# Patient Record
Sex: Male | Born: 1935 | Race: White | Hispanic: No | Smoking: Current every day smoker
Health system: Southern US, Community
[De-identification: ages and names within clinical notes are randomized; demographics above are authoritative.]

## PROBLEM LIST (undated history)

## (undated) DIAGNOSIS — Z972 Presence of dental prosthetic device (complete) (partial): Secondary | ICD-10-CM

## (undated) DIAGNOSIS — I6522 Occlusion and stenosis of left carotid artery: Secondary | ICD-10-CM

## (undated) DIAGNOSIS — M5430 Sciatica, unspecified side: Secondary | ICD-10-CM

## (undated) DIAGNOSIS — T4145XA Adverse effect of unspecified anesthetic, initial encounter: Secondary | ICD-10-CM

## (undated) DIAGNOSIS — J449 Chronic obstructive pulmonary disease, unspecified: Secondary | ICD-10-CM

## (undated) DIAGNOSIS — T8859XA Other complications of anesthesia, initial encounter: Secondary | ICD-10-CM

## (undated) DIAGNOSIS — Z973 Presence of spectacles and contact lenses: Secondary | ICD-10-CM

## (undated) DIAGNOSIS — K449 Diaphragmatic hernia without obstruction or gangrene: Secondary | ICD-10-CM

## (undated) DIAGNOSIS — F329 Major depressive disorder, single episode, unspecified: Secondary | ICD-10-CM

## (undated) DIAGNOSIS — K219 Gastro-esophageal reflux disease without esophagitis: Secondary | ICD-10-CM

## (undated) DIAGNOSIS — F419 Anxiety disorder, unspecified: Secondary | ICD-10-CM

## (undated) DIAGNOSIS — J189 Pneumonia, unspecified organism: Secondary | ICD-10-CM

## (undated) DIAGNOSIS — I10 Essential (primary) hypertension: Secondary | ICD-10-CM

## (undated) DIAGNOSIS — G8929 Other chronic pain: Secondary | ICD-10-CM

## (undated) DIAGNOSIS — N400 Enlarged prostate without lower urinary tract symptoms: Secondary | ICD-10-CM

## (undated) DIAGNOSIS — M1990 Unspecified osteoarthritis, unspecified site: Secondary | ICD-10-CM

## (undated) DIAGNOSIS — F32A Depression, unspecified: Secondary | ICD-10-CM

## (undated) DIAGNOSIS — Z9289 Personal history of other medical treatment: Secondary | ICD-10-CM

## (undated) DIAGNOSIS — N429 Disorder of prostate, unspecified: Secondary | ICD-10-CM

## (undated) HISTORY — DX: Other chronic pain: G89.29

## (undated) HISTORY — DX: Depression, unspecified: F32.A

## (undated) HISTORY — DX: Benign prostatic hyperplasia without lower urinary tract symptoms: N40.0

## (undated) HISTORY — DX: Sciatica, unspecified side: M54.30

## (undated) HISTORY — DX: Essential (primary) hypertension: I10

## (undated) HISTORY — DX: Anxiety disorder, unspecified: F41.9

## (undated) HISTORY — PX: MULTIPLE TOOTH EXTRACTIONS: SHX2053

## (undated) HISTORY — DX: Disorder of prostate, unspecified: N42.9

## (undated) HISTORY — DX: Gastro-esophageal reflux disease without esophagitis: K21.9

## (undated) HISTORY — DX: Major depressive disorder, single episode, unspecified: F32.9

## (undated) HISTORY — DX: Diaphragmatic hernia without obstruction or gangrene: K44.9

---

## 2001-07-11 HISTORY — PX: ESOPHAGOGASTRODUODENOSCOPY: SHX1529

## 2001-10-02 ENCOUNTER — Ambulatory Visit (HOSPITAL_COMMUNITY): Admission: RE | Admit: 2001-10-02 | Discharge: 2001-10-02 | Payer: Self-pay | Admitting: Family Medicine

## 2001-10-02 ENCOUNTER — Encounter: Payer: Self-pay | Admitting: Family Medicine

## 2001-11-06 ENCOUNTER — Ambulatory Visit (HOSPITAL_COMMUNITY): Admission: RE | Admit: 2001-11-06 | Discharge: 2001-11-06 | Payer: Self-pay | Admitting: Internal Medicine

## 2004-12-02 ENCOUNTER — Ambulatory Visit: Payer: Self-pay | Admitting: Internal Medicine

## 2004-12-09 ENCOUNTER — Ambulatory Visit: Payer: Self-pay | Admitting: Internal Medicine

## 2004-12-09 ENCOUNTER — Ambulatory Visit (HOSPITAL_COMMUNITY): Admission: RE | Admit: 2004-12-09 | Discharge: 2004-12-09 | Payer: Self-pay | Admitting: Internal Medicine

## 2004-12-09 HISTORY — PX: COLONOSCOPY: SHX174

## 2006-10-26 ENCOUNTER — Ambulatory Visit: Payer: Self-pay | Admitting: Internal Medicine

## 2009-04-22 ENCOUNTER — Ambulatory Visit (HOSPITAL_COMMUNITY): Admission: RE | Admit: 2009-04-22 | Discharge: 2009-04-22 | Payer: Self-pay | Admitting: Family Medicine

## 2010-11-03 ENCOUNTER — Encounter: Payer: Self-pay | Admitting: Gastroenterology

## 2010-11-03 ENCOUNTER — Ambulatory Visit (INDEPENDENT_AMBULATORY_CARE_PROVIDER_SITE_OTHER): Payer: Medicare Other | Admitting: Gastroenterology

## 2010-11-03 VITALS — BP 149/68 | HR 68 | Temp 98.7°F | Ht 70.0 in | Wt 164.6 lb

## 2010-11-03 DIAGNOSIS — R1314 Dysphagia, pharyngoesophageal phase: Secondary | ICD-10-CM

## 2010-11-03 DIAGNOSIS — K219 Gastro-esophageal reflux disease without esophagitis: Secondary | ICD-10-CM

## 2010-11-03 DIAGNOSIS — Q393 Congenital stenosis and stricture of esophagus: Secondary | ICD-10-CM

## 2010-11-03 DIAGNOSIS — R1310 Dysphagia, unspecified: Secondary | ICD-10-CM

## 2010-11-03 DIAGNOSIS — K222 Esophageal obstruction: Secondary | ICD-10-CM

## 2010-11-03 NOTE — Assessment & Plan Note (Signed)
Typical heartburn symptoms well controlled on omeprazole.

## 2010-11-03 NOTE — Progress Notes (Signed)
Cc to PCP 

## 2010-11-03 NOTE — Progress Notes (Signed)
Primary Care Physician:  Kirk Ruths, MD  Primary Gastroenterologist:  Dr. Roetta Sessions  Chief Complaint  Patient presents with  . Dysphagia    feels like food is coming back up after swallowing    HPI:  Peter [name] is a 77 y.o. male here for further evaluation of dysphagia. He has a history of Schatzki ring status post dilation in 2003. He complains of chronic intermittent dysphagia to solid foods. Sometimes with liquids. Food comes back up. Cannot swallow large pills. Hates to eat out because he has to excuse himself to vomiting food back up. No heartburn. No abdominal pain. Took Aleve one day last week, burned esophagus. Tolerating ibuprofen. No odynophagia now. BM regular on lactulose. No melena, brbpr.    Current Outpatient Prescriptions  Medication Sig Dispense Refill  . ALPRAZolam (XANAX) 0.5 MG tablet Take 0.5 mg by mouth at bedtime as needed.        Marland Kitchen aspirin 325 MG tablet Take 325 mg by mouth as needed.        . finasteride (PROSCAR) 5 MG tablet Take 5 mg by mouth daily.        Marland Kitchen lactulose (CHRONULAC) 10 GM/15ML solution Take 20 g by mouth 3 (three) times daily.        Marland Kitchen lisinopril-hydrochlorothiazide (PRINZIDE,ZESTORETIC) 20-12.5 MG per tablet Take 1 tablet by mouth daily.        . naproxen sodium (ANAPROX) 220 MG tablet Take 220 mg by mouth as needed.        Marland Kitchen omeprazole (PRILOSEC) 20 MG capsule Take 20 mg by mouth daily.        . tadalafil (CIALIS) 10 MG tablet Take 10 mg by mouth daily as needed.        . Tamsulosin HCl (FLOMAX) 0.4 MG CAPS Take 0.4 mg by mouth daily after supper.          Allergies as of 11/03/2010  . (No Known Allergies)    Past Medical History  Diagnosis Date  . HTN (hypertension)   . Anxiety   . GERD (gastroesophageal reflux disease)   . Prostate disease     h/o elevated PSA, neg bx, [facility]    Past Surgical History  Procedure Date  . Esophagogastroduodenoscopy 2003    schatzki ring, small vascular abnormality of duodenal  bulb ?Dieulafoy's lesion (ablated)  . Colonoscopy 12/2004    diverticulosis, hyperplastic polyps, hemorrhoids    Family History  Problem Relation Age of Onset  . Stroke Father 33    deceased  . Colon cancer Neg Hx     History   Social History  . Marital Status: Married    Spouse Name: N/A    Number of Children: 1  . Years of Education: N/A   Occupational History  . 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 Smoking status: Former Smoker -- 1.0 packs/day for 50 years    Types: Cigarettes    Quit date: 10/16/2010  . Smokeless tobacco: Never Used  . Alcohol Use: No     quit 5 years ago  . Drug Use: No  . Sexually Active: Not Currently -- Male partner(s)    Birth Control/ Protection: None     spouse, has to use viagra     ROS:  General: Negative for anorexia, weight loss, fever, chills, fatigue, weakness. Eyes: Negative for vision changes.  ENT: Negative for hoarseness, difficulty swallowing , nasal congestion. CV: Negative for chest pain, angina,  palpitations, dyspnea on exertion, peripheral edema.  Respiratory: Negative for dyspnea at rest, dyspnea on exertion, cough, sputum, wheezing.  GI: See history of present illness. GU:  Negative for dysuria, hematuria, urinary incontinence, urinary frequency, nocturnal urination.  MS: Negative for joint pain, low back pain.  Derm: Negative for rash or itching. Skin lesions from sun exposure.  Neuro: Negative for weakness, abnormal sensation, seizure, frequent headaches, memory loss, confusion.  Psych: Negative for anxiety, depression, suicidal ideation, hallucinations.  Endo: Negative for unusual weight change.  Heme: Negative for bruising or bleeding. Allergy: Negative for rash or hives.    Physical Examination:  BP 149/68  Pulse 68  Temp 98.7 F (37.1 C)  Ht 5\' 10"  (1.778 m)  Wt 164 lb 9.6 oz (74.662 kg)  BMI 23.62 kg/m2   General: Well-nourished, well-developed in no acute distress.  Head:  Normocephalic, atraumatic.   Eyes: Conjunctiva pink, no icterus. Mouth: Oropharyngeal mucosa moist and pink , no lesions erythema or exudate. Neck: Supple without thyromegaly, masses, or lymphadenopathy.  Lungs: Clear to auscultation bilaterally.  Heart: Regular rate and rhythm, no murmurs rubs or gallops.  Abdomen: Bowel sounds are normal, nontender, nondistended, no hepatosplenomegaly or masses, no abdominal bruits or    hernia , no rebound or guarding.   Extremities: No lower extremity edema.  Neuro: Alert and oriented x 4 , grossly normal neurologically.  Skin: Warm and dry, no rash or jaundice.   Psych: Alert and cooperative, normal mood and affect.

## 2010-11-03 NOTE — Assessment & Plan Note (Signed)
Recurrent dysphagia mostly to solid foods. Likely has recurrent Schatzki ring or other stricture. EGD with dilation in the near future. I have discussed the risks, alternatives, benefits with regards to but not limited to the risk of reaction to medication, bleeding, infection, perforation and the patient is agreeable to proceed. Written consent to be obtained.

## 2010-11-10 ENCOUNTER — Encounter: Payer: Medicare Other | Admitting: Internal Medicine

## 2010-11-10 ENCOUNTER — Ambulatory Visit (HOSPITAL_COMMUNITY)
Admission: RE | Admit: 2010-11-10 | Discharge: 2010-11-10 | Disposition: A | Discharge status: Home / Self Care | Payer: Medicare Other | Source: Ambulatory Visit | Attending: Internal Medicine | Admitting: Internal Medicine

## 2010-11-10 DIAGNOSIS — K449 Diaphragmatic hernia without obstruction or gangrene: Secondary | ICD-10-CM | POA: Insufficient documentation

## 2010-11-10 DIAGNOSIS — R1310 Dysphagia, unspecified: Secondary | ICD-10-CM | POA: Insufficient documentation

## 2010-11-10 DIAGNOSIS — Z79899 Other long term (current) drug therapy: Secondary | ICD-10-CM | POA: Insufficient documentation

## 2010-11-10 DIAGNOSIS — Z7982 Long term (current) use of aspirin: Secondary | ICD-10-CM | POA: Insufficient documentation

## 2010-11-10 DIAGNOSIS — K222 Esophageal obstruction: Secondary | ICD-10-CM | POA: Insufficient documentation

## 2010-11-10 DIAGNOSIS — I10 Essential (primary) hypertension: Secondary | ICD-10-CM | POA: Insufficient documentation

## 2010-11-10 HISTORY — PX: ESOPHAGOGASTRODUODENOSCOPY: SHX1529

## 2010-11-11 NOTE — Op Note (Signed)
  NAME:  Peter [name], Peter [name]                ACCOUNT NO.:  [id number]  MEDICAL RECORD NO.:  [id number]           PATIENT TYPE:  O  LOCATION:  [facility]                          FACILITY:  [facility]  PHYSICIAN:  R. Roetta Sessions, M.D. DATE OF BIRTH:  1932/12/26  DATE OF PROCEDURE:  11/10/2010 DATE OF DISCHARGE:                              OPERATIVE REPORT   PROCEDURE:  EGD with Elease Hashimoto dilation.  INDICATIONS FOR PROCEDURE:  This is a very pleasant 77 year old gentleman with recurrent esophageal dysphagia, history of Schatzki's ring dilated back in 2003, reflux symptoms well-controlled on Prilosec 20 mg orally daily.  EGD is now being done potential for esophageal dilation and reviewed.  Risks, benefits, limitations, alternatives, have been discussed, questions answered.  Please see the documentation in the medical record.  PROCEDURE NOTE:  O2 saturation, blood pressure, pulse, respirations were monitored throughout the entire procedure.  CONSCIOUS SEDATION:  Versed 6 mg IV, fentanyl 125 mcg IV in divided doses.  Cetacaine spray for topical pharyngeal anesthesia.  INSTRUMENT:  Pentax video chip system.  FINDINGS:  Examination of the tubular esophagus revealed a two distal tandem fibers rings, the most distal one was quite tight would not initially admit the scope with gentle pressure I was able to pop through and this was associated with some dilation of the ring.  There was no esophagitis, no evidence Barrett's esophagus or tumor.  Stomach:  Gastric cavity was emptied and insufflated well with air. Thorough examination of gastric mucosa including retroflexion of proximal stomach, esophagogastric junction demonstrated a moderate-sized hiatal hernia only.  Pylorus was patent, easily traversed.  Examination of the bulb and second portion revealed no abnormalities.  THERAPEUTIC/DIAGNOSTIC MANEUVERS PERFORMED:  Scope was withdrawn.  A 52- French Maloney dilator was passed to full insertion with  mild-to- moderate resistance upon full insertion. A look back revealed both rings had been ruptured and there was a superficial 2-cm tear across the proximal ring which was again located distally just above the ring at the GE junction.  The patient tolerated the procedure well, was reactive to endoscopy.  IMPRESSION:  Two distal tandem esophageal ring status post dilation and disruption as described above.  Moderate size hiatal hernia, otherwise normal stomach, D1 and D2.  RECOMMENDATIONS: 1. Advance diet as tolerated, swallowing precautions emphasized.     Continue Prilosec 20 mg orally daily. 2. Follow up appointment with [place] in 6 months. 3. He was dilated to 56-French size back in 2003, things looked a     little tighter today.  He may need an additional dilation and a not     too distant future, get him up and back up to a reasonably normal     lumen, but today's maneuver should help him considerably.     Jonathon Bellows, M.D.     RMR/MEDQ  D:  11/10/2010  T:  11/10/2010  Job:  [id number]  cc:   Kirk Ruths, M.D. Fax: [phone]  Electronically Signed by Lorrin Goodell M.D. on 11/11/2010 03:48:56 PM

## 2010-11-26 NOTE — H&P (Signed)
Peter [name], Peter [name]                ACCOUNT NO.:  [id number]   MEDICAL RECORD NO.:  [id number]          PATIENT TYPE:  AMB   LOCATION:                                FACILITY:  [facility]   PHYSICIAN:  Lionel December, M.D.    DATE OF BIRTH:  Mar 10, 1933   DATE OF ADMISSION:  12/02/2004  DATE OF DISCHARGE:  LH                                HISTORY & PHYSICAL   CHIEF COMPLAINT:  Constipation.   HISTORY OF PRESENT ILLNESS:  The patient is a 77 year old Caucasian  gentleman with history of gastroesophageal reflux disease and dysphagia  secondary to Schatzki's ring requiring dilatation, who presents today for  further evaluation of new onset constipation.  For the last several weeks he  has been having difficulty with constipation.  He feels it may be due to  taking multiple supplements and vitamins recently.  He has since stopped  many of the new ones that he had started and this past week he has had  regular bowel movements again.  He denies any melena or rectal bleeding.  He  has never had a colonoscopy and would like to proceed with one at this time.  He denies any problems with dysphagia, nausea, vomiting, heartburn, melena,  abdominal pain.   CURRENT MEDICATIONS:  1.  Protonix 40 mg daily.  2.  Xanax 0.5 mg q.h.s. p.r.n.  3.  Saw Palmetto 320 mg b.i.d.  4.  Oral calcium 370 mg b.i.d.  5.  Aspirin 325 mg daily.  6.  Lisinopril 20/12.5 mg daily.   ALLERGIES:  PENICILLIN.   PAST MEDICAL HISTORY:  1.  Hypertension.  2.  Anxiety.  3.  Prostate disease, reports that his PSA is high; however, he has had a      negative biopsy before.  He is followed by Dr. Jerre Simon.  4.  Gastroesophageal reflux disease.  5.  History of dysphagia, status post dilatation of Schatzki's ring, last      procedure April 2003.  6.  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   PAST SURGICAL HISTORY:  None.   FAMILY HISTORY:  Father died of stroke at age  86.  Negative for colorectal  cancer or chronic GI illnesses.   SOCIAL HISTORY:  He is married and has one daughter.  He is retired from  [employer].  Positive for tobacco use.  He occasionally consumes alcohol.   REVIEW OF SYSTEMS:  See HPI for GI.  CONSTITUTIONAL:  Denies any weight  loss.  CARDIOPULMONARY:  Denies chest pain or shortness of breath,  palpitations, cough.   PHYSICAL EXAMINATION:  VITAL SIGNS:  Weight 179-1/2, height 5 feet 10  inches, temperature 98.4, blood pressure 132/74, pulse 88.  GENERAL:  Pleasant, well-nourished, well-developed, elderly, Caucasian male  in no acute distress.  SKIN:  Warm and dry, no jaundice.  HEENT:  Pupils equal, round, and reactive to light.  Conjunctivae are pink.  Sclerae nonicteric.  Oropharyngeal mucosa moist and pink.  No lesions,  erythema, or exudate.  NECK:  No lymphadenopathy or thyromegaly.  CHEST:  Lungs clear to auscultation.  CARDIAC:  Reveals regular rate and rhythm.  Normal S1/S2.  No murmurs, rubs,  or gallops.  ABDOMEN:  Positive bowel sounds, soft, nontender, nondistended.  No  organomegaly or masses.  EXTREMITIES:  No edema.   IMPRESSION:  Mr. Guertin is a 77 year old gentleman who recently had a  change in bowel movements.  He has had several weeks of constipation which  is unusual for him.  This may be due to vitamin supplements that he  initiated recently; however, he has never had a colonoscopy and therefore,  recommend one at this time.  I have discussed risks, alternatives, and  benefits with the patient with regards to, but not limited to, risk of  bleeding, infection, reaction to medication, or perforation of the bowel.  The patient is agreeable and wants to proceed.  He has a history of  gastroesophageal reflux disease and dysphagia with no symptoms at this time.  He is requesting trial of omeprazole due to financial reasons.   PLAN:  1.  Colonoscopy in the near future.  The patient requests an anxiolytic to       be given at time of colonoscopy, as he is prone to panic attacks, and      states that he had one immediately after his upper endoscopy in 2003.      Will discuss further with Dr. Karilyn Cota.  2.  Stop Protonix and try omeprazole 20 mg daily, #30, with five refills      given.  3.  Fiber choice two tablets daily, #20, samples given.      LL/MEDQ  D:  12/02/2004  T:  12/02/2004  Job:  [id number]   cc:   Kirk Ruths, M.D.  [address]  [place]  [place] [address]  Fax: [phone]

## 2010-11-26 NOTE — Group Therapy Note (Signed)
[facility]  Patient:    Peter [name], Peter [name] Visit Number: [id number] MRN: [id number]          Service Type: END Location: DAY Attending Physician:  Malissa Hippo Dictated by:   Lionel December, M.D. Admit Date:  11/06/2001   CC:         Karleen Hampshire, M.D.   Progress Note  COMMENTS:  The patient is a 77 year old Caucasian male who underwent EGD with esophageal dilatation and electrocoagulation of a vascular abnormality in in the sternum. He did well during and after the procedure. When he was brought from PACU to his room, he developed a drop in his blood pressure and heart rate. He also became diaphoretic but did not have any chest pain or dyspnea. He was felt to have vasovagal. He was seen by Dr. Jayme Cloud before I got there. He was given 1 mg of atropine IV and his heart rate quickly returned in 80s, and his blood pressure normalized. He still did not experience any chest pain or dyspnea. I talked with his wife and their daughter. They told me that he has had a few episodes like this before. I also talked with Dr. Regino Schultze over the phone, and he told me he has had vasovagal before. He was monitored in the hospital for about two hours, and he did not have any more problems. EKG was obtained which revealed normal sinus rhythm and ST abnormality in inferior leads felt to be a repolarization abnormality. The patient was discharged to home in stable condition.  I will talk with Dr. Regino Schultze and maybe he should consider having an exercise tolerance test to make sure he does not have some type of arrhythmia. Dictated by:   Lionel December, M.D. Attending Physician:  Malissa Hippo DD:  11/06/01 TD:  11/06/01 Job: [id number]

## 2010-11-26 NOTE — Op Note (Signed)
[name], [name]                ACCOUNT NO.:  [id number]   MEDICAL RECORD NO.:  [id number]          PATIENT TYPE:  AMB   LOCATION:  DAY                           FACILITY:  [facility]   PHYSICIAN:  Lionel December, M.D.    DATE OF BIRTH:  [birth year]   DATE OF PROCEDURE:  12/09/2004  DATE OF DISCHARGE:                                 OPERATIVE REPORT   PROCEDURE:  Colonoscopy.   INDICATIONS:  Peter [name] is a 77 year old Caucasian male with recent onset of  constipation, who is undergoing a diagnostic colonoscopy.  He denies rectal  bleeding or abdominal pain.  He has never undergone a colonoscopy or a  barium enema in the past.  The procedure risks were reviewed the patient,  informed consent was obtained.   PREMEDICATION:  Fentanyl 50 mcg IV, Versed 8 mg IV in divided dose.   FINDINGS:  Procedure performed in endoscopy suite.  The patient's vital  signs and O2 saturation were monitored during procedure and remained stable.  The patient was placed in left lateral recumbent position.  Rectal  examination performed.  No abnormality noted on external or digital exam.  The Olympus video scope was placed in the rectum and advanced under vision  into sigmoid colon, which was noncompliant and tortuous with scattered  diverticula.  Slowly and carefully the scope was advanced into splenic  flexure and further intubation of the cecum was easy.  The cecum and was  identified by ileocecal valve and appendiceal orifice.  Pictures were taken  for the record.  There was a single diverticulum at cecum as well.  As the  scope was withdrawn, colonic mucosa was examined for the second time.  There  were two small polyps at distal sigmoid colon which were ablated via cold  biopsy.  There were a couple more small polyps at rectum, which had  appearance of hyperplastic polyps and were left alone.  The scope was  retroflexed to examine anorectal junction, and small hemorrhoids were noted  below the dentate line.   Endoscope was straightened and withdrawn.  The  patient tolerated the procedure well.   FINAL DIAGNOSES:  1.  Colonic diverticulosis predominantly involving sigmoid and descending      colon with a single diverticulum also seen at cecum.  2.  Two small polyps removed via cold biopsy from distal sigmoid colon.  3.  External hemorrhoids.   RECOMMENDATIONS:  1.  He will continue high fiber diet, Benefiber and a Fiber Choice  daily.  2.  Lactulose one to two tablespoonfuls q.h.s.  Prescription given for one      quart refills for up to a year.  If this regimen does not help his      constipation, he will give [place] a call.       NR/MEDQ  D:  12/09/2004  T:  12/09/2004  Job:  [id number]   cc:   Kirk Ruths, M.D.  [address]  [place]  [place] [address]  Fax: [phone]

## 2011-05-16 ENCOUNTER — Ambulatory Visit (INDEPENDENT_AMBULATORY_CARE_PROVIDER_SITE_OTHER): Payer: Medicare Other | Admitting: Urgent Care

## 2011-05-16 ENCOUNTER — Encounter: Payer: Self-pay | Admitting: Urgent Care

## 2011-05-16 DIAGNOSIS — K219 Gastro-esophageal reflux disease without esophagitis: Secondary | ICD-10-CM

## 2011-05-16 DIAGNOSIS — K222 Esophageal obstruction: Secondary | ICD-10-CM

## 2011-05-16 DIAGNOSIS — R1314 Dysphagia, pharyngoesophageal phase: Secondary | ICD-10-CM

## 2011-05-16 DIAGNOSIS — R1310 Dysphagia, unspecified: Secondary | ICD-10-CM

## 2011-05-16 DIAGNOSIS — Q391 Atresia of esophagus with tracheo-esophageal fistula: Secondary | ICD-10-CM

## 2011-05-16 DIAGNOSIS — Q393 Congenital stenosis and stricture of esophagus: Secondary | ICD-10-CM

## 2011-05-16 DIAGNOSIS — K5900 Constipation, unspecified: Secondary | ICD-10-CM

## 2011-05-16 MED ORDER — OMEPRAZOLE 20 MG PO CPDR: 20.0000 mg | DELAYED_RELEASE_CAPSULE | Freq: Two times a day (BID) | ORAL | Status: DC

## 2011-05-16 MED ORDER — LACTULOSE 10 GM/15ML PO SOLN: 20.0000 g | Freq: Every evening | ORAL | Status: DC | PRN

## 2011-05-16 NOTE — Assessment & Plan Note (Signed)
Doing well on daily lactulose. Prescription refill. Colonoscopy 2016.

## 2011-05-16 NOTE — Assessment & Plan Note (Signed)
Improved.  Will call if he feels like he needs repeat dilation.

## 2011-05-16 NOTE — Progress Notes (Signed)
Cc to PCP 

## 2011-05-16 NOTE — Progress Notes (Signed)
Referring Provider: Kirk Ruths, MD Primary Care Physician:  Kirk Ruths, MD Primary Gastroenterologist:  Dr. Jena Gauss  Chief Complaint  Patient presents with  . Follow-up    doing ok    HPI:  Peter [name] is a 77 y.o. male here for follow up for dysphagia.  He tells me he has been doing very well since esophageal dilation. He did have 1 episode where he felt like his esophagus was closing.  Otherwise, he has been able to be in drink pretty much anything he wants.  Denies heartburn & indigestion.  Wanting a better PPI.  Denies abd pain.  Takes lactulose each night for constipation-wants refill.  She has 1-2 soft brown bowel movements daily. Denies any rectal bleeding or melena. His weight has been stable. Appetite is good. Past Medical History  Diagnosis Date  . HTN (hypertension)   . Anxiety   . GERD (gastroesophageal reflux disease)   . Prostate disease     h/o elevated PSA, neg bx, [facility]    Past Surgical History  Procedure Date  . Esophagogastroduodenoscopy 2003    schatzki ring, small vascular abnormality of duodenal bulb ?Dieulafoy's lesion (ablated)  . Colonoscopy 12/2004    diverticulosis, hyperplastic polyps, hemorrhoids  . Esophagogastroduodenoscopy 11/10/10    Dr. Jena Gauss dilation 2 rings with 83F    Current Outpatient Prescriptions  Medication Sig Dispense Refill  . ALPRAZolam (XANAX) 0.5 MG tablet Take 0.5 mg by mouth at bedtime as needed.        Marland Kitchen aspirin 325 MG tablet Take 325 mg by mouth as needed.        . finasteride (PROSCAR) 5 MG tablet Take 5 mg by mouth daily.        Marland Kitchen lactulose (CHRONULAC) 10 GM/15ML solution Take 30 mLs (20 g total) by mouth at bedtime as needed.  240 mL  5  . lisinopril-hydrochlorothiazide (PRINZIDE,ZESTORETIC) 20-12.5 MG per tablet Take 1 tablet by mouth daily.        . naproxen sodium (ANAPROX) 220 MG tablet Take 220 mg by mouth as needed.        Marland Kitchen omeprazole (PRILOSEC) 20 MG capsule Take 1 capsule (20 mg total) by  mouth 2 (two) times daily. Before breakfast & dinner  62 capsule  5  . tadalafil (CIALIS) 10 MG tablet Take 10 mg by mouth daily as needed.        . Tamsulosin HCl (FLOMAX) 0.4 MG CAPS Take 0.4 mg by mouth daily after supper.          Allergies as of 05/16/2011  . (No Known Allergies)    Review of Systems: Gen: Denies any fever, chills, sweats, anorexia, fatigue, weakness, malaise, weight loss, and sleep disorder CV: Denies chest pain, angina, palpitations, syncope, orthopnea, PND, peripheral edema, and claudication. Resp: Denies dyspnea at rest, dyspnea with exercise, cough, sputum, wheezing, coughing up blood, and pleurisy. GI: Denies vomiting blood, jaundice, and fecal incontinence.   Derm: Denies rash, itching, dry skin, hives, moles, warts, or unhealing ulcers.  Psych: Denies depression, anxiety, memory loss, suicidal ideation, hallucinations, paranoia, and confusion. Heme: Denies bruising, bleeding, and enlarged lymph nodes.  Physical Exam: BP 129/71  Pulse 86  Temp(Src) 98 F (36.7 C) (Temporal)  Ht 5\' 10"  (1.778 m)  Wt 172 lb 12.8 oz (78.382 kg)  BMI 24.79 kg/m2 General:   Alert,  Well-developed, well-nourished, pleasant and cooperative in NAD Head:  Normocephalic and atraumatic. Eyes:  Sclera clear, no icterus.   Conjunctiva pink.  Mouth:  No deformity or lesions, OP pink/moist. Neck:  Supple; no masses or thyromegaly. Heart:  Regular rate and rhythm; no murmurs, clicks, rubs,  or gallops. Abdomen:  Soft, nontender and nondistended. No masses, hepatosplenomegaly or hernias noted. Normal bowel sounds, without guarding, and without rebound.   Msk:  Symmetrical without gross deformities. Normal posture. Pulses:  Normal pulses noted. Extremities:  Without clubbing or edema. Neurologic:  Alert and  oriented x4;  grossly normal neurologically. Skin:  Intact without significant lesions or rashes. Cervical Nodes:  No significant cervical adenopathy. Psych:  Alert and  cooperative. Normal mood and affect.

## 2011-05-16 NOTE — Patient Instructions (Signed)
Increase omeprazole to before breakfast & dinner (twice per day) Continue lactulose at bedtime for constipation Call if any problems swallowing Colonoscopy in 2016

## 2011-05-16 NOTE — Assessment & Plan Note (Signed)
History of complicated GERD with Schatzki's /esophageal rings.  Last dilation 11/10/10. He has done very well. He would like to increase his omeprazole to twice a day dosing to see if this helps decrease the interval between dilations. I told him we would do a trial for 6 months and see if this helps. If not difference, he may resume once daily dosing.   omeprazole 20 mg twice a day, 5 refills

## 2011-08-17 ENCOUNTER — Emergency Department (HOSPITAL_COMMUNITY): Payer: Medicare Other

## 2011-08-17 ENCOUNTER — Encounter (HOSPITAL_COMMUNITY): Payer: Self-pay | Admitting: *Deleted

## 2011-08-17 ENCOUNTER — Emergency Department (HOSPITAL_COMMUNITY)
Admission: EM | Admit: 2011-08-17 | Discharge: 2011-08-17 | Disposition: A | Discharge status: Home / Self Care | Payer: Medicare Other | Attending: Emergency Medicine | Admitting: Emergency Medicine

## 2011-08-17 DIAGNOSIS — Z23 Encounter for immunization: Secondary | ICD-10-CM | POA: Insufficient documentation

## 2011-08-17 DIAGNOSIS — W1809XA Striking against other object with subsequent fall, initial encounter: Secondary | ICD-10-CM | POA: Insufficient documentation

## 2011-08-17 DIAGNOSIS — N429 Disorder of prostate, unspecified: Secondary | ICD-10-CM | POA: Insufficient documentation

## 2011-08-17 DIAGNOSIS — R51 Headache: Secondary | ICD-10-CM | POA: Insufficient documentation

## 2011-08-17 DIAGNOSIS — S0180XA Unspecified open wound of other part of head, initial encounter: Secondary | ICD-10-CM | POA: Insufficient documentation

## 2011-08-17 DIAGNOSIS — I10 Essential (primary) hypertension: Secondary | ICD-10-CM | POA: Insufficient documentation

## 2011-08-17 DIAGNOSIS — K219 Gastro-esophageal reflux disease without esophagitis: Secondary | ICD-10-CM | POA: Insufficient documentation

## 2011-08-17 DIAGNOSIS — M47812 Spondylosis without myelopathy or radiculopathy, cervical region: Secondary | ICD-10-CM | POA: Insufficient documentation

## 2011-08-17 DIAGNOSIS — Y9289 Other specified places as the place of occurrence of the external cause: Secondary | ICD-10-CM | POA: Insufficient documentation

## 2011-08-17 DIAGNOSIS — S0083XA Contusion of other part of head, initial encounter: Secondary | ICD-10-CM

## 2011-08-17 DIAGNOSIS — I498 Other specified cardiac arrhythmias: Secondary | ICD-10-CM | POA: Insufficient documentation

## 2011-08-17 DIAGNOSIS — F411 Generalized anxiety disorder: Secondary | ICD-10-CM | POA: Insufficient documentation

## 2011-08-17 DIAGNOSIS — S0181XA Laceration without foreign body of other part of head, initial encounter: Secondary | ICD-10-CM

## 2011-08-17 DIAGNOSIS — Z7982 Long term (current) use of aspirin: Secondary | ICD-10-CM | POA: Insufficient documentation

## 2011-08-17 DIAGNOSIS — F1021 Alcohol dependence, in remission: Secondary | ICD-10-CM | POA: Insufficient documentation

## 2011-08-17 DIAGNOSIS — Z87891 Personal history of nicotine dependence: Secondary | ICD-10-CM | POA: Insufficient documentation

## 2011-08-17 MED ORDER — LORAZEPAM 1 MG PO TABS
ORAL_TABLET | ORAL | Status: AC
  Administered 2011-08-17: 1 mg
  Filled 2011-08-17: qty 1

## 2011-08-17 MED ORDER — LIDOCAINE HCL (PF) 1 % IJ SOLN
5.0000 mL | Freq: Once | INTRAMUSCULAR | Status: AC
  Administered 2011-08-17: 5 mL
  Filled 2011-08-17: qty 5

## 2011-08-17 MED ORDER — TETANUS-DIPHTHERIA TOXOIDS TD 5-2 LFU IM INJ
0.5000 mL | INJECTION | Freq: Once | INTRAMUSCULAR | Status: AC
  Administered 2011-08-17: 0.5 mL via INTRAMUSCULAR
  Filled 2011-08-17: qty 0.5

## 2011-08-17 MED ORDER — TETANUS-DIPHTH-ACELL PERTUSSIS 5-2.5-18.5 LF-MCG/0.5 IM SUSP: 0.5000 mL | Freq: Once | INTRAMUSCULAR | Status: DC

## 2011-08-17 NOTE — ED Notes (Addendum)
Pt on backboard. 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 All VS WNL. Pt talking to staff.

## 2011-08-17 NOTE — ED Notes (Signed)
PA at bedside.

## 2011-08-17 NOTE — ED Notes (Signed)
Pt to nursing station wanting to be discharged, Jacobus PA aware

## 2011-08-17 NOTE — ED Notes (Signed)
Discharge instructions reviewed with pt, questions answered. Pt verbalized understanding.  

## 2011-08-17 NOTE — ED Provider Notes (Signed)
History     CSN: [id number]  Arrival date & time 08/17/11  0813   First MD Initiated Contact with Patient 08/17/11 [phone]      Chief Complaint  Patient presents with  . Fall  . Head Laceration    (Consider location/radiation/quality/duration/timing/severity/associated sxs/prior treatment) HPI Comments: Patient states he was locking a bathroom door taking a step backward when he stumbled over a curb. He then fell into some trim bushes, his head and sustained a laceration to the right for head. He denies loss of consciousness. He denies difficulty with movement of any extremities. His family denies any change in his speech or thought patterns. He is not had any nausea or vomiting since the fall. He does complain of mild headache. No visual changes. He is unsure of his last tetanus shot. He presents now for evaluation of these injuries.  The patient was brought to the emergency department on long spine board with neck collar O. Mobilization as well. There were no neurologic changes during transport according to EMS.  Patient is a 78 y.o. male presenting with fall and scalp laceration. The history is provided by the patient and the spouse.  Fall Pertinent negatives include no abdominal pain and no hematuria.  Head Laceration Pertinent negatives include no abdominal pain, arthralgias, chest pain, coughing or neck pain.    Past Medical History  Diagnosis Date  . HTN (hypertension)   . Anxiety   . GERD (gastroesophageal reflux disease)   . Prostate disease     h/o elevated PSA, neg bx, [facility]    Past Surgical History  Procedure Date  . Esophagogastroduodenoscopy 2003    schatzki ring, small vascular abnormality of duodenal bulb ?Dieulafoy's lesion (ablated)  . Colonoscopy 12/2004    diverticulosis, hyperplastic polyps, hemorrhoids  . Esophagogastroduodenoscopy 11/10/10    Dr. Jena Gauss dilation 2 rings with 63F    Family History  Problem Relation Age of Onset  . Stroke  Father 35    deceased  . Colon cancer Neg Hx     History  Substance Use Topics  . Smoking status: Former Smoker -- 1.0 packs/day for 50 years    Types: Cigarettes    Quit date: 10/16/2010  . Smokeless tobacco: Never Used  . Alcohol Use: No     quit 5 years ago      Review of Systems  Constitutional: Negative for activity change.       All ROS Neg except as noted in HPI  HENT: Negative for nosebleeds and neck pain.   Eyes: Negative for photophobia and discharge.  Respiratory: Negative for cough, shortness of breath and wheezing.   Cardiovascular: Negative for chest pain and palpitations.  Gastrointestinal: Negative for abdominal pain and blood in stool.       Indigestion  Genitourinary: Negative for dysuria, frequency and hematuria.  Musculoskeletal: Negative for back pain and arthralgias.  Skin: Negative.   Neurological: Negative for dizziness, seizures and speech difficulty.  Psychiatric/Behavioral: Negative for hallucinations and confusion. The patient is nervous/anxious.     Allergies  Review of patient's allergies indicates no known allergies.  Home Medications   Current Outpatient Rx  Name Route Sig Dispense Refill  . ALPRAZOLAM 0.5 MG PO TABS Oral Take 0.5 mg by mouth at bedtime as needed.      . ASPIRIN 325 MG PO TABS Oral Take 325 mg by mouth as needed.      Marland Kitchen FINASTERIDE 5 MG PO TABS Oral Take 5 mg by mouth daily.      Marland Kitchen  LACTULOSE 10 GM/15ML PO SOLN Oral Take 30 mLs (20 g total) by mouth at bedtime as needed. 240 mL 5  . LISINOPRIL-HYDROCHLOROTHIAZIDE 20-12.5 MG PO TABS Oral Take 1 tablet by mouth daily.      Marland Kitchen NAPROXEN SODIUM 220 MG PO TABS Oral Take 220 mg by mouth as needed.      Marland Kitchen OMEPRAZOLE 20 MG PO CPDR Oral Take 1 capsule (20 mg total) by mouth 2 (two) times daily. Before breakfast & dinner 62 capsule 5  . TADALAFIL 10 MG PO TABS Oral Take 10 mg by mouth daily as needed.      Marland Kitchen TAMSULOSIN HCL 0.4 MG PO CAPS Oral Take 0.4 mg by mouth daily after supper.         BP 155/60  Pulse 60  Temp(Src) 96.5 F (35.8 C) (Oral)  Resp 17  Ht 5\' 11"  (1.803 m)  Wt 170 lb (77.111 kg)  BMI 23.71 kg/m2  SpO2 96%  Physical Exam  Nursing note and vitals reviewed. Constitutional: He is oriented to person, place, and time. He appears well-developed and well-nourished.  Non-toxic appearance.  HENT:  Right Ear: Tympanic membrane and external ear normal.  Left Ear: Tympanic membrane and external ear normal.  Nose: Nose normal.       There is a 4 cm laceration to the right for head. Bleeding controlled.there is no periorbital tenderness appreciated. No pain or deformity of the TMJ.  Eyes: EOM and lids are normal. Pupils are equal, round, and reactive to light.  Neck: Carotid bruit is not present.       C-spine immobilized in a collar. No palpable step off or deformity of the cervical spine.  Cardiovascular: Normal rate, regular rhythm, intact distal pulses and normal pulses.   Murmur heard. Pulmonary/Chest: Breath sounds normal. No respiratory distress.       No rib or chest wall tenderness appreciated. No palpable deformity. No bruise appreciated.  Abdominal: Soft. Bowel sounds are normal. There is no tenderness. There is no guarding.  Musculoskeletal: Normal range of motion.       Good range of motion of upper and lower extremities. No pain to movement or manipulation of the pelvis.  Lymphadenopathy:       Head (right side): No submandibular adenopathy present.       Head (left side): No submandibular adenopathy present.    He has no cervical adenopathy.  Neurological: He is alert and oriented to person, place, and time. He has normal strength. No cranial nerve deficit or sensory deficit. He exhibits normal muscle tone. Coordination normal.  Skin: Skin is warm and dry.  Psychiatric: His speech is normal. His mood appears anxious.    ED Course  LACERATION REPAIR Date/Time: 08/17/2011 9:40 AM Performed by: Kathie Dike Authorized by: Kathie Dike Consent: Verbal consent obtained. Risks and benefits: risks, benefits and alternatives were discussed Consent given by: patient Patient understanding: patient states understanding of the procedure being performed Patient identity confirmed: arm band Time out: Immediately prior to procedure a "time out" was called to verify the correct patient, procedure, equipment, support staff and site/side marked as required. Body area: head/neck Location details: forehead Laceration length: 3.8 cm Foreign body present: bark. Nerve involvement: none Vascular damage: no Anesthesia: local infiltration Local anesthetic: lidocaine 1% without epinephrine Patient sedated: no Preparation: Patient was prepped and draped in the usual sterile fashion. Irrigation solution: saline Amount of cleaning: standard Debridement: none Degree of undermining: none Skin closure: 5-0 nylon Number  of sutures: 6 Technique: simple Approximation: close Approximation difficulty: simple Dressing: 4x4 sterile gauze Comments: FB removed by me.   (including critical care time)  Labs Reviewed - No data to display No results found. EKG: Time 7:51. Rate: 55. Rhythm: Sinus bradycardia. PR: Normal. QRS: Normal. ST: Normal.No STEMI, or life threatening arrhythmia. Except for slower rate, unchanged from 11/09/2006.  No diagnosis found.    MDM  I have reviewed nursing notes, vital signs, and all appropriate lab and imaging results for this patient. The results of the CT scans were discussed with the patient and the family. The need to have the sutures removed in 5-7 days was discussed. Patient has not had any changes to his exam while being in the emergency department. He is conversing with his family without problem and ambulating in the room and hall without problem.       Kathie Dike, [place] 08/17/11 [phone]

## 2011-08-17 NOTE — ED Notes (Signed)
Family at bedside. Patient wants to know if his X-ray is back he states that he wants his C-collar off that it is hurting him. RN Tammy Sours aware of situation. Patient was told as soon as x-rays are back we would try and remove the c-collar

## 2011-08-17 NOTE — ED Notes (Signed)
C-Collar removed per PA [facility]

## 2011-08-17 NOTE — ED Notes (Signed)
PA Hobson at bedside to suture LAC

## 2011-08-17 NOTE — ED Notes (Signed)
Backboard removed per MD, neck collar remains in place.  Pt's wife stated "when he has a panic attack he passes out". Pt also believes he had a panic attack before he fell.

## 2011-08-18 NOTE — ED Provider Notes (Signed)
Medical screening examination/treatment/procedure(s) were performed by non-physician practitioner and as supervising physician I was immediately available for consultation/collaboration.   Laray Anger, DO 08/18/11 1215

## 2011-11-11 ENCOUNTER — Encounter: Payer: Self-pay | Admitting: Internal Medicine

## 2012-01-05 ENCOUNTER — Other Ambulatory Visit: Payer: Self-pay

## 2012-01-05 MED ORDER — LACTULOSE 10 GM/15ML PO SOLN: 20.0000 g | Freq: Every evening | ORAL | Status: DC | PRN

## 2012-01-06 ENCOUNTER — Telehealth: Payer: Self-pay

## 2012-01-06 NOTE — Telephone Encounter (Signed)
Pt said his refill on the lactulose will not last him a month. I called pharmacist, Sherrine Maples, at [facility]. He said if he can just a verbal OK. He can give the 473 ml ( which is two bottles at the time and that will last pt about a month) Please advise!

## 2012-01-06 NOTE — Telephone Encounter (Signed)
OK 

## 2012-01-06 NOTE — Telephone Encounter (Signed)
Called and informed pharmacist, Sherrine Maples.

## 2012-03-20 ENCOUNTER — Telehealth: Payer: Self-pay | Admitting: Internal Medicine

## 2012-03-20 MED ORDER — OMEPRAZOLE 20 MG PO CPDR: 20.0000 mg | DELAYED_RELEASE_CAPSULE | Freq: Two times a day (BID) | ORAL | Status: DC

## 2012-03-20 NOTE — Telephone Encounter (Signed)
Done

## 2012-03-20 NOTE — Telephone Encounter (Signed)
Pt came to front window requesting that we call in a refill prescription to [facility] for his Omeprazole. He has 2 pills left and wants to pick it up in 30-40 minutes.

## 2012-05-14 ENCOUNTER — Encounter (HOSPITAL_COMMUNITY): Payer: Self-pay | Admitting: Pharmacy Technician

## 2012-05-16 NOTE — Patient Instructions (Signed)
Your procedure is scheduled on:  05/21/12  Report to [facility] at 06:30 AM.  Call this number if you have problems the morning of surgery: [phone]   Remember:   Do not eat or drink:After Midnight.  Take these medicines the morning of surgery with A SIP OF WATER: Omeprazole, Lisinopril-HCTZ, and Flomax.   Do not wear jewelry, make-up or nail polish.  Do not wear lotions, powders, or perfumes. You may wear deodorant.  Do not shave 48 hours prior to surgery. Men may shave face and neck.  Do not bring valuables to the hospital.  Contacts, dentures or bridgework may not be worn into surgery.  Leave suitcase in the car. After surgery it may be brought to your room.  For patients admitted to the hospital, checkout time is 11:00 AM the day of discharge.   Patients discharged the day of surgery will not be allowed to drive home.    Special Instructions: Start using your eye drops before surgery as directed by your eye doctor.   Please read over the following fact sheets that you were given: Anesthesia Post-op Instructions    Cataract Surgery  A cataract is a clouding of the lens of the eye. When a lens becomes cloudy, vision is reduced based on the degree and nature of the clouding. Surgery may be needed to improve vision. Surgery removes the cloudy lens and usually replaces it with a substitute lens (intraocular lens, IOL). LET YOUR EYE DOCTOR KNOW ABOUT:  Allergies to food or medicine.  Medicines taken including herbs, eyedrops, over-the-counter medicines, and creams.  Use of steroids (by mouth or creams).  Previous problems with anesthetics or numbing medicine.  History of bleeding problems or blood clots.  Previous surgery.  Other health problems, including diabetes and kidney problems.  Possibility of pregnancy, if this applies. RISKS AND COMPLICATIONS  Infection.  Inflammation of the eyeball (endophthalmitis) that can spread to both eyes (sympathetic  ophthalmia).  Poor wound healing.  If an IOL is inserted, it can later fall out of proper position. This is very uncommon.  Clouding of the part of your eye that holds an IOL in place. This is called an "after-cataract." These are uncommon, but easily treated. BEFORE THE PROCEDURE  Do not eat or drink anything except small amounts of water for 8 to 12 before your surgery, or as directed by your caregiver.  Unless you are told otherwise, continue any eyedrops you have been prescribed.  Talk to your primary caregiver about all other medicines that you take (both prescription and non-prescription). In some cases, you may need to stop or change medicines near the time of your surgery. This is most important if you are taking blood-thinning medicine.Do not stop medicines unless you are told to do so.  Arrange for someone to drive you to and from the procedure.  Do not put contact lenses in either eye on the day of your surgery. PROCEDURE There is more than one method for safely removing a cataract. Your doctor can explain the differences and help determine which is best for you. Phacoemulsification surgery is the most common form of cataract surgery.  An injection is given behind the eye or eyedrops are given to make this a painless procedure.  A small cut (incision) is made on the edge of the clear, dome-shaped surface that covers the front of the eye (cornea).  A tiny probe is painlessly inserted into the eye. This device gives off ultrasound waves that soften and break  up the cloudy center of the lens. This makes it easier for the cloudy lens to be removed by suction.  An IOL may be implanted.  The normal lens of the eye is covered by a clear capsule. Part of that capsule is intentionally left in the eye to support the IOL.  Your surgeon may or may not use stitches to close the incision. There are other forms of cataract surgery that require a larger incision and stiches to close the  eye. This approach is taken in cases where the doctor feels that the cataract cannot be easily removed using phacoemulsification. AFTER THE PROCEDURE  When an IOL is implanted, it does not need care. It becomes a permanent part of your eye and cannot be seen or felt.  Your doctor will schedule follow-up exams to check on your progress.  Review your other medicines with your doctor to see which can be resumed after surgery.  Use eyedrops or take medicine as prescribed by your doctor. Document Released: 06/16/2011 Document Revised: 09/19/2011 Document Reviewed: 06/16/2011 [facility] Patient Information 2013 [place], [place].    PATIENT INSTRUCTIONS POST-ANESTHESIA  IMMEDIATELY FOLLOWING SURGERY:  Do not drive or operate machinery for the first twenty four hours after surgery.  Do not make any important decisions for twenty four hours after surgery or while taking narcotic pain medications or sedatives.  If you develop intractable nausea and vomiting or a severe headache please notify your doctor immediately.  FOLLOW-UP:  Please make an appointment with your surgeon as instructed. You do not need to follow up with anesthesia unless specifically instructed to do so.  WOUND CARE INSTRUCTIONS (if applicable):  Keep a dry clean dressing on the anesthesia/puncture wound site if there is drainage.  Once the wound has quit draining you may leave it open to air.  Generally you should leave the bandage intact for twenty four hours unless there is drainage.  If the epidural site drains for more than 36-48 hours please call the anesthesia department.  QUESTIONS?:  Please feel free to call your physician or the hospital operator if you have any questions, and they will be happy to assist you.

## 2012-05-17 ENCOUNTER — Encounter (HOSPITAL_COMMUNITY): Payer: Self-pay

## 2012-05-17 ENCOUNTER — Encounter (HOSPITAL_COMMUNITY)
Admission: RE | Admit: 2012-05-17 | Discharge: 2012-05-17 | Disposition: A | Discharge status: Home / Self Care | Payer: Medicare Other | Source: Ambulatory Visit | Attending: Ophthalmology | Admitting: Ophthalmology

## 2012-05-17 ENCOUNTER — Other Ambulatory Visit (HOSPITAL_COMMUNITY): Payer: Medicare Other

## 2012-05-17 HISTORY — DX: Unspecified osteoarthritis, unspecified site: M19.90

## 2012-05-17 LAB — BASIC METABOLIC PANEL
Chloride: 89 mEq/L — ABNORMAL LOW (ref 96–112)
GFR calc Af Amer: >90 mL/min (ref >90)
Potassium: 3.8 mEq/L (ref 3.5–5.1)
Sodium: 128 mEq/L — ABNORMAL LOW (ref 135–145)

## 2012-05-17 LAB — HEMOGLOBIN AND HEMATOCRIT, BLOOD: Hemoglobin: 15.1 g/dL (ref 13.0–17.0)

## 2012-05-21 ENCOUNTER — Ambulatory Visit (HOSPITAL_COMMUNITY): Payer: Medicare Other | Admitting: Anesthesiology

## 2012-05-21 ENCOUNTER — Encounter (HOSPITAL_COMMUNITY): Payer: Self-pay | Admitting: Anesthesiology

## 2012-05-21 ENCOUNTER — Encounter (HOSPITAL_COMMUNITY)
Admission: RE | Disposition: A | Discharge status: Home / Self Care | Payer: Self-pay | Source: Ambulatory Visit | Attending: Ophthalmology

## 2012-05-21 ENCOUNTER — Encounter (HOSPITAL_COMMUNITY): Payer: Self-pay | Admitting: Ophthalmology

## 2012-05-21 ENCOUNTER — Ambulatory Visit (HOSPITAL_COMMUNITY)
Admission: RE | Admit: 2012-05-21 | Discharge: 2012-05-21 | Disposition: A | Discharge status: Home / Self Care | Payer: Medicare Other | Source: Ambulatory Visit | Attending: Ophthalmology | Admitting: Ophthalmology

## 2012-05-21 ENCOUNTER — Encounter (HOSPITAL_COMMUNITY): Payer: Self-pay | Admitting: *Deleted

## 2012-05-21 DIAGNOSIS — I10 Essential (primary) hypertension: Secondary | ICD-10-CM | POA: Insufficient documentation

## 2012-05-21 DIAGNOSIS — Z01812 Encounter for preprocedural laboratory examination: Secondary | ICD-10-CM | POA: Insufficient documentation

## 2012-05-21 DIAGNOSIS — J449 Chronic obstructive pulmonary disease, unspecified: Secondary | ICD-10-CM | POA: Insufficient documentation

## 2012-05-21 DIAGNOSIS — H2510 Age-related nuclear cataract, unspecified eye: Secondary | ICD-10-CM | POA: Insufficient documentation

## 2012-05-21 DIAGNOSIS — Z01810 Encounter for preprocedural cardiovascular examination: Secondary | ICD-10-CM | POA: Insufficient documentation

## 2012-05-21 DIAGNOSIS — J4489 Other specified chronic obstructive pulmonary disease: Secondary | ICD-10-CM | POA: Insufficient documentation

## 2012-05-21 HISTORY — PX: CATARACT EXTRACTION W/PHACO: SHX586

## 2012-05-21 SURGERY — PHACOEMULSIFICATION, CATARACT, WITH IOL INSERTION
Anesthesia: Monitor Anesthesia Care | Site: Eye | Laterality: Right | Wound class: Clean

## 2012-05-21 MED ORDER — NEOMYCIN-POLYMYXIN-DEXAMETH 0.1 % OP OINT
TOPICAL_OINTMENT | OPHTHALMIC | Status: DC | PRN
  Administered 2012-05-21: 1 via OPHTHALMIC

## 2012-05-21 MED ORDER — PHENYLEPHRINE HCL 2.5 % OP SOLN
OPHTHALMIC | Status: AC
  Filled 2012-05-21: qty 2

## 2012-05-21 MED ORDER — FENTANYL CITRATE 0.05 MG/ML IJ SOLN: 25.0000 ug | INTRAMUSCULAR | Status: DC | PRN

## 2012-05-21 MED ORDER — LACTATED RINGERS IV SOLN
INTRAVENOUS | Status: DC
  Administered 2012-05-21: 07:00:00 via INTRAVENOUS

## 2012-05-21 MED ORDER — MIDAZOLAM HCL 2 MG/2ML IJ SOLN
INTRAMUSCULAR | Status: AC
  Filled 2012-05-21: qty 2

## 2012-05-21 MED ORDER — LIDOCAINE HCL 3.5 % OP GEL
OPHTHALMIC | Status: AC
  Filled 2012-05-21: qty 5

## 2012-05-21 MED ORDER — MIDAZOLAM HCL 2 MG/2ML IJ SOLN
1.0000 mg | INTRAMUSCULAR | Status: DC | PRN
  Administered 2012-05-21 (×2): 1 mg via INTRAVENOUS

## 2012-05-21 MED ORDER — LIDOCAINE HCL 3.5 % OP GEL: 1.0000 "application " | Freq: Once | OPHTHALMIC | Status: DC

## 2012-05-21 MED ORDER — EPINEPHRINE HCL 1 MG/ML IJ SOLN
INTRAOCULAR | Status: DC | PRN
  Administered 2012-05-21: 08:00:00

## 2012-05-21 MED ORDER — PROVISC 10 MG/ML IO SOLN
INTRAOCULAR | Status: DC | PRN
  Administered 2012-05-21: 8.5 mg via INTRAOCULAR

## 2012-05-21 MED ORDER — PHENYLEPHRINE HCL 2.5 % OP SOLN
1.0000 [drp] | OPHTHALMIC | Status: AC
  Administered 2012-05-21 (×3): 1 [drp] via OPHTHALMIC

## 2012-05-21 MED ORDER — EPINEPHRINE HCL 1 MG/ML IJ SOLN
INTRAMUSCULAR | Status: AC
  Filled 2012-05-21: qty 1

## 2012-05-21 MED ORDER — CYCLOPENTOLATE-PHENYLEPHRINE 0.2-1 % OP SOLN
OPHTHALMIC | Status: AC
  Filled 2012-05-21: qty 2

## 2012-05-21 MED ORDER — CYCLOPENTOLATE-PHENYLEPHRINE 0.2-1 % OP SOLN
1.0000 [drp] | OPHTHALMIC | Status: AC
  Administered 2012-05-21 (×3): 1 [drp] via OPHTHALMIC

## 2012-05-21 MED ORDER — BSS IO SOLN
INTRAOCULAR | Status: DC | PRN
Start: 2012-05-21 — End: 2012-05-21
  Administered 2012-05-21: 15 mL via INTRAOCULAR

## 2012-05-21 MED ORDER — TETRACAINE HCL 0.5 % OP SOLN
OPHTHALMIC | Status: AC
  Filled 2012-05-21: qty 2

## 2012-05-21 MED ORDER — POVIDONE-IODINE 5 % OP SOLN
OPHTHALMIC | Status: DC | PRN
  Administered 2012-05-21: 1 via OPHTHALMIC

## 2012-05-21 MED ORDER — LIDOCAINE 3.5 % OP GEL OPTIME - NO CHARGE
OPHTHALMIC | Status: DC | PRN
  Administered 2012-05-21: 2 [drp] via OPHTHALMIC

## 2012-05-21 MED ORDER — ONDANSETRON HCL 4 MG/2ML IJ SOLN: 4.0000 mg | Freq: Once | INTRAMUSCULAR | Status: DC | PRN

## 2012-05-21 MED ORDER — TETRACAINE HCL 0.5 % OP SOLN
1.0000 [drp] | OPHTHALMIC | Status: AC
  Administered 2012-05-21 (×3): 1 [drp] via OPHTHALMIC

## 2012-05-21 SURGICAL SUPPLY — 32 items

## 2012-05-21 NOTE — Addendum Note (Signed)
Addendum  created 05/21/12 0847 by Moshe Salisbury, CRNA   Modules edited:Anesthesia Flowsheet

## 2012-05-21 NOTE — Brief Op Note (Signed)
Pre-Op Dx: Cataract OD Post-Op Dx: Cataract OD Surgeon: Kiefer Opheim Anesthesia: Topical with MAC Surgery: Cataract Extraction with Intraocular lens Implant OD Implant: B&L enVista Specimen: None Complications: None 

## 2012-05-21 NOTE — Anesthesia Postprocedure Evaluation (Signed)
  Anesthesia Post-op Note  Patient: Peter [name]  Procedure(s) Performed: Procedure(s) (LRB) with comments: CATARACT EXTRACTION PHACO AND INTRAOCULAR LENS PLACEMENT (IOC) (Right) - CDE 19.34  Patient Location: PACU and Short Stay  Anesthesia Type:MAC  Level of Consciousness: awake, alert  and oriented  Airway and Oxygen Therapy: Patient Spontanous Breathing  Post-op Pain: none  Post-op Assessment: Post-op Vital signs reviewed, Patient's Cardiovascular Status Stable, Respiratory Function Stable, Patent Airway and No signs of Nausea or vomiting  Post-op Vital Signs: Reviewed and stable  Complications: No apparent anesthesia complications

## 2012-05-21 NOTE — Transfer of Care (Signed)
Immediate Anesthesia Transfer of Care Note  Patient: Peter [name]  Procedure(s) Performed: Procedure(s) (LRB) with comments: CATARACT EXTRACTION PHACO AND INTRAOCULAR LENS PLACEMENT (IOC) (Right) - CDE 19.34  Patient Location: PACU  Anesthesia Type:MAC  Level of Consciousness: awake  Airway & Oxygen Therapy: Patient Spontanous Breathing  Post-op Assessment: Report given to PACU RN  Post vital signs: Reviewed  Complications: No apparent anesthesia complications

## 2012-05-21 NOTE — H&P (Signed)
I have reviewed the H&P, the patient was re-examined, and I have identified no interval changes in medical condition and plan of care since the history and physical of record  

## 2012-05-21 NOTE — Addendum Note (Signed)
Addendum  created 05/21/12 0848 by Moshe Salisbury, CRNA   Modules edited:Anesthesia Flowsheet

## 2012-05-21 NOTE — Anesthesia Preprocedure Evaluation (Signed)
Anesthesia Evaluation  Patient identified by MRN, date of birth, ID band Patient awake    Reviewed: Allergy & Precautions, H&P , NPO status , Patient's Chart, lab work & pertinent test results  Airway Mallampati: III      Dental  (+) Partial Upper   Pulmonary COPDCurrent Smoker,  breath sounds clear to auscultation        Cardiovascular hypertension, Pt. on medications Rhythm:Regular     Neuro/Psych Anxiety    GI/Hepatic GERD-  Medicated,  Endo/Other    Renal/GU      Musculoskeletal   Abdominal   Peds  Hematology   Anesthesia Other Findings   Reproductive/Obstetrics                           Anesthesia Physical Anesthesia Plan  ASA: III  Anesthesia Plan: MAC   Post-op Pain Management:    Induction: Intravenous  Airway Management Planned: Nasal Cannula  Additional Equipment:   Intra-op Plan:   Post-operative Plan:   Informed Consent: I have reviewed the patients History and Physical, chart, labs and discussed the procedure including the risks, benefits and alternatives for the proposed anesthesia with the patient or authorized representative who has indicated his/her understanding and acceptance.     Plan Discussed with:   Anesthesia Plan Comments:         Anesthesia Quick Evaluation

## 2012-05-21 NOTE — Op Note (Signed)
NAMEHASIB, CHOATE NO.:  [id number]  MEDICAL RECORD NO.:  [id number]  LOCATION:  [facility]                          FACILITY:  [facility]  PHYSICIAN:  Susanne Greenhouse, MD       DATE OF BIRTH:  May 19, 1933  DATE OF PROCEDURE:  05/21/2012 DATE OF DISCHARGE:  05/21/2012                              OPERATIVE REPORT   PREOPERATIVE DIAGNOSIS:  Nuclear cataract, right eye, diagnosis code 366.16.  POSTOPERATIVE DIAGNOSIS:  Nuclear cataract, right eye, diagnosis code 366.16.  SURGEON:  Susanne Greenhouse, MD  OPERATION PERFORMED:  Phacoemulsification with posterior chamber intraocular lens implantation, right eye.  ANESTHESIA:  Topical with monitored anesthesia care and IV sedation.  OPERATIVE SUMMARY:  In the preoperative area, dilating drops were placed into the right eye.  The patient was then brought into the operating room where he was placed under topical anesthesia and IV sedation.  The eye was then prepped and draped.  Beginning with a 75 blade, a paracentesis port was made at the surgeon's 2 o'clock position.  The anterior chamber was then filled with a 1% nonpreserved lidocaine solution with epinephrine.  This was followed by Viscoat to deepen the chamber.  A small fornix-based peritomy was performed superiorly.  Next, a single iris hook was placed through the limbus superiorly.  A 2.4-mm keratome blade was then used to make a clear corneal incision over the iris hook.  A bent cystotome needle and Utrata forceps were used to create a continuous tear capsulotomy.  Hydrodissection was performed using balanced salt solution on a fine cannula.  The lens nucleus was then removed using phacoemulsification in a quadrant cracking technique. The cortical material was then removed with irrigation and aspiration. The capsular bag and anterior chamber were refilled with Provisc.  The wound was widened to approximately 3 mm and a posterior chamber intraocular lens was placed into the  capsular bag without difficulty using an [employer] lens injecting system.  A single 10-0 nylon suture was then used to close the incision as well as stromal hydration. The Provisc was removed from the anterior chamber and capsular bag with irrigation and aspiration.  At this point, the wounds were tested for leak, which were negative.  The anterior chamber remained deep and stable.  The patient tolerated the procedure well.  There were no operative complications, and he awoke from topical anesthesia and IV sedation without problem.  No surgical specimens.  Prosthetic device used is a Bausch and Lomb posterior chamber lens, model enVista, model number MX60, power is 19.0, serial number is [id number].          ______________________________ Susanne Greenhouse, MD     KEH/MEDQ  D:  05/21/2012  T:  05/21/2012  Job:  [id number]

## 2012-05-23 ENCOUNTER — Encounter (HOSPITAL_COMMUNITY): Payer: Self-pay | Admitting: Ophthalmology

## 2012-05-23 MED ORDER — LIDOCAINE HCL (PF) 1 % IJ SOLN
INTRAOCULAR | Status: AC | PRN
  Administered 2012-05-21: 08:00:00 via OPHTHALMIC

## 2012-07-09 ENCOUNTER — Other Ambulatory Visit: Payer: Self-pay | Admitting: Gastroenterology

## 2012-09-18 ENCOUNTER — Other Ambulatory Visit (HOSPITAL_COMMUNITY): Payer: Self-pay | Admitting: Internal Medicine

## 2012-09-18 ENCOUNTER — Ambulatory Visit (HOSPITAL_COMMUNITY)
Admission: RE | Admit: 2012-09-18 | Discharge: 2012-09-18 | Disposition: A | Discharge status: Home / Self Care | Payer: Medicare Other | Source: Ambulatory Visit | Attending: Internal Medicine | Admitting: Internal Medicine

## 2012-09-18 DIAGNOSIS — M545 Low back pain, unspecified: Secondary | ICD-10-CM | POA: Insufficient documentation

## 2012-09-18 DIAGNOSIS — M51379 Other intervertebral disc degeneration, lumbosacral region without mention of lumbar back pain or lower extremity pain: Secondary | ICD-10-CM | POA: Insufficient documentation

## 2012-09-21 ENCOUNTER — Other Ambulatory Visit (HOSPITAL_COMMUNITY): Payer: Self-pay | Admitting: Internal Medicine

## 2012-09-21 ENCOUNTER — Ambulatory Visit (HOSPITAL_COMMUNITY)
Admission: RE | Admit: 2012-09-21 | Discharge: 2012-09-21 | Disposition: A | Discharge status: Home / Self Care | Payer: Medicare Other | Source: Ambulatory Visit | Attending: Internal Medicine | Admitting: Internal Medicine

## 2012-09-21 DIAGNOSIS — M51379 Other intervertebral disc degeneration, lumbosacral region without mention of lumbar back pain or lower extremity pain: Secondary | ICD-10-CM | POA: Insufficient documentation

## 2012-09-21 DIAGNOSIS — M545 Low back pain, unspecified: Secondary | ICD-10-CM | POA: Insufficient documentation

## 2012-09-21 DIAGNOSIS — M48061 Spinal stenosis, lumbar region without neurogenic claudication: Secondary | ICD-10-CM | POA: Insufficient documentation

## 2012-09-24 ENCOUNTER — Other Ambulatory Visit (HOSPITAL_COMMUNITY): Payer: Medicare Other

## 2012-11-16 ENCOUNTER — Other Ambulatory Visit: Payer: Self-pay | Admitting: Gastroenterology

## 2012-11-19 NOTE — Telephone Encounter (Signed)
RF X 3. He will need OV prior to further refills.

## 2012-12-06 ENCOUNTER — Other Ambulatory Visit: Payer: Self-pay | Admitting: Gastroenterology

## 2013-03-12 ENCOUNTER — Telehealth: Payer: Self-pay | Admitting: Internal Medicine

## 2013-03-12 NOTE — Telephone Encounter (Signed)
Routing to RMR- pts last ov was 05/2011

## 2013-03-12 NOTE — Telephone Encounter (Signed)
Pt came to front office window this morning wanting to make an OV. He is aware of OV on 9/24 with AS, but would like to be seen sooner if something becomes available before then. He has been taking omeprazole and feels that it isn't helping him as much and was there anything RMR could recommend to take in place of this. He uses [facility]. Please advise [phone]

## 2013-03-13 NOTE — Telephone Encounter (Signed)
Called pt- LMOM- #4 boxes of dexilant at the front desk.

## 2013-03-13 NOTE — Telephone Encounter (Signed)
If just more of the same reflux symptoms he was previously having, could try Dexilant 60 mg daily until office visit. He is welcome to come by the office for 2 weeks worth of samples. Hold omeprazole while taking Dexilant.

## 2013-04-03 ENCOUNTER — Ambulatory Visit (INDEPENDENT_AMBULATORY_CARE_PROVIDER_SITE_OTHER): Payer: Medicare Other | Admitting: Gastroenterology

## 2013-04-03 ENCOUNTER — Telehealth: Payer: Self-pay | Admitting: Internal Medicine

## 2013-04-03 ENCOUNTER — Encounter: Payer: Self-pay | Admitting: Gastroenterology

## 2013-04-03 VITALS — BP 147/66 | HR 99 | Temp 97.4°F | Ht 70.0 in | Wt 167.6 lb

## 2013-04-03 DIAGNOSIS — K5900 Constipation, unspecified: Secondary | ICD-10-CM

## 2013-04-03 DIAGNOSIS — I499 Cardiac arrhythmia, unspecified: Secondary | ICD-10-CM | POA: Insufficient documentation

## 2013-04-03 DIAGNOSIS — K219 Gastro-esophageal reflux disease without esophagitis: Secondary | ICD-10-CM

## 2013-04-03 MED ORDER — DEXLANSOPRAZOLE 60 MG PO CPDR: 60.0000 mg | DELAYED_RELEASE_CAPSULE | Freq: Every day | ORAL | Status: DC

## 2013-04-03 MED ORDER — LACTULOSE 10 GM/15ML PO SOLN: ORAL | Status: DC

## 2013-04-03 NOTE — Telephone Encounter (Signed)
Patient stated he was wondering if his irregular heart beat could be related to Dexilant. He has agreed to follow-up with his PCP on Monday for further evaluation. Doubt PPI is causing this. Although there is a risk of low magnesium with chronic PPI use, I doubt this is the culprit.   However, let's check a BMP and magnesium if patient is willing. At time of visit, he was hesitant to pursue any further work-up.

## 2013-04-03 NOTE — Progress Notes (Signed)
Referring Provider: Karleen Hampshire, MD Primary Care Physician:  Kirk Ruths, MD Primary GI: Dr. Jena Gauss   Chief Complaint  Patient presents with  . Gastrophageal Reflux    HPI:   Peter [name] returns today with a history of GERD and Schatzki's ring/esophageal ring s/p dilations, most recently in May 2012. Last seen almost 2 years ago and doing well after dilation. 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 Recently prescribed sample course of Dexilant by PCP for 20 days, which alleviated GERD symptoms. Ran out. While on Omeprazole would note intermittent dysphagia. Would have to spit food up, wait 10-15 minutes, then he was able to eat. However, since the trial of Dexilant, he has had no issues at all. No abdominal pain. +Constipation, lactulose works well for him. Eats 1 cup of Activia a day, which he feels helps. Not interested in changing lactulose to another agent. No rectal bleeding. Doesn't eat as much as he used to because "I'm older".    Past Medical History  Diagnosis Date  . HTN (hypertension)   . Anxiety   . GERD (gastroesophageal reflux disease)   . Prostate disease     h/o elevated PSA, neg bx, [facility]  . Arthritis   . Sciatica     Past Surgical History  Procedure Laterality Date  . Esophagogastroduodenoscopy  2003    schatzki ring, small vascular abnormality of duodenal bulb ?Dieulafoy's lesion (ablated)  . Colonoscopy  12/2004    diverticulosis, hyperplastic polyps, hemorrhoids  . Esophagogastroduodenoscopy  11/10/10    Dr. Jena Gauss dilation 2 rings with 46F  . Cataract extraction w/phaco  05/21/2012    Procedure: CATARACT EXTRACTION PHACO AND INTRAOCULAR LENS PLACEMENT (IOC);  Surgeon: Gemma Payor, MD;  Location: [facility];  Service: Ophthalmology;  Laterality: Right;  CDE 19.34    Current Outpatient Prescriptions  Medication Sig Dispense Refill  . ALPRAZolam (XANAX) 0.5 MG tablet Take 0.5 mg by mouth at bedtime.       Marland Kitchen aspirin EC 81 MG tablet Take  81 mg by mouth daily. Only takes two days out the week      . dexlansoprazole (DEXILANT) 60 MG capsule Take 1 capsule (60 mg total) by mouth daily.  30 capsule  11  . finasteride (PROSCAR) 5 MG tablet Take 5 mg by mouth daily with lunch.       . ibuprofen (ADVIL,MOTRIN) 200 MG tablet Take 400 mg by mouth every 8 (eight) hours as needed. pain      . lactulose (CHRONULAC) 10 GM/15ML solution TAKE TWO TABLESPOONFULS AT BEDTIME AS NEEDED. MAY TITRATE AS NEEDED.  946 mL  3  . lisinopril-hydrochlorothiazide (PRINZIDE,ZESTORETIC) 20-12.5 MG per tablet Take 1 tablet by mouth daily.        . Tamsulosin HCl (FLOMAX) 0.4 MG CAPS Take 0.4 mg by mouth daily after supper.        No current facility-administered medications for this visit.   Facility-Administered Medications Ordered in Other Visits  Medication Dose Route Frequency Provider Last Rate Last Dose  . shugarcaine ophthalmic solution    PRN Gemma Payor, MD        Allergies as of 04/03/2013 - Review Complete 04/03/2013  Allergen Reaction Noted  . Penicillins Rash 05/17/2012    Family History  Problem Relation Age of Onset  . Stroke Father 76    deceased  . Colon cancer Neg Hx     History   Social History  . Marital Status: Married    Spouse Name: N/A  Number of Children: 1  . Years of Education: N/A   Occupational History  . 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 Smoking status: Current Every Day Smoker -- 0.50 packs/day for 52 years    Types: Cigarettes  . Smokeless tobacco: Never Used  . Alcohol Use: No     Comment: quit 8  years ago  . Drug Use: No  . Sexual Activity: Not Currently    Partners: Female    Birth Control/ Protection: None     Comment: spouse, has to use viagra   Other Topics Concern  . None   Social History Narrative  . None    Review of Systems: Gen: Denies fever, chills, anorexia. Denies fatigue, weakness, weight loss.  CV: Denies chest pain, palpitations, syncope, peripheral edema,  and claudication. Resp: Denies dyspnea at rest, cough, wheezing, coughing up blood, and pleurisy. GI: see HPI Derm: Denies rash, itching, dry skin Psych: Denies depression, anxiety, memory loss, confusion. No homicidal or suicidal ideation.    Physical Exam: BP 147/66  Pulse 99  Temp(Src) 97.4 F (36.3 C) (Oral)  Ht 5\' 10"  (1.778 m)  Wt 167 lb 9.6 oz (76.023 kg)  BMI 24.05 kg/m2 General:   Alert and oriented. No distress noted. Pleasant and cooperative.  Head:  Normocephalic and atraumatic. Eyes:  Conjuctiva clear without scleral icterus. Heart: S1, S2 present, intermittent skipped beats, irregular.   Abdomen:  +BS, soft, non-tender and non-distended. No rebound or guarding. No HSM or masses noted. Msk:  Symmetrical without gross deformities. Normal posture. Extremities:  Without edema. Neurologic:  Alert and  oriented x4;  grossly normal neurologically. Skin:  Intact without significant lesions or rashes. Psych:  Alert and cooperative. Normal mood and affect.

## 2013-04-03 NOTE — Patient Instructions (Addendum)
If you feel like your heart starts beating fast, have any chest pain, shortness of breath, or dizziness, you need to go to the emergency room.   I would like you to see Dr. Regino Schultze again this week to make sure your heart isn't irregular. Our office is trying to arrange this.   Keep taking Dexilant each morning. We have provided samples and sent this to your pharmacy. They will let us know if this is covered or not.   Patient scheduled to see Dr. Regino Schultze on Monday Sept 29th at 9:45

## 2013-04-03 NOTE — Telephone Encounter (Signed)
Pt was seen today and called this afternoon with concerns of the side effects of dexilant. He would like [facility] to call him back at [phone]

## 2013-04-03 NOTE — Assessment & Plan Note (Signed)
Continue Lactulose. Pt declines any other agents to trial at this time.

## 2013-04-03 NOTE — Assessment & Plan Note (Signed)
With history of dilations in the past, most recently 2012. Noted worsening of GERD on Prilosec with several episodes of dysphagia; however, this resolved with Dexilant. Likely these episodes secondary to uncontrolled GERD. Recommend continuing Dexilant once daily, with EGD/ED if any further episodes of dysphagia.   Return in 1 year.

## 2013-04-03 NOTE — Assessment & Plan Note (Signed)
Incidentally noted with auscultation. Irregular, intermittent skipped beats. BP stable, without any dizziness, chest pain, shortness of breath, or sensation of palpitations. I have reviewed prior EKGs in epic that are NSR. Patient denies any prior rhythm abnormalities. I discussed the importance of further assessment of this, due to new finding. He has politely refused further work-up at this time despite my clear recommendations and possible risks of adverse events if he does not have this evaluated. I offered to make an appt with Dr. Regino Schultze this week, but he has refused this as well. I reviewed signs and symptoms that would prompt urgent medical attention. He thanked me for my concern but stated " I am not worried about my heart".

## 2013-04-04 NOTE — Progress Notes (Signed)
CC'd to PCP 

## 2013-04-04 NOTE — Telephone Encounter (Signed)
Pt called back and he wants to wait until he sees his pcp on Monday.

## 2013-04-04 NOTE — Telephone Encounter (Signed)
Tried to call pt- LMOM 

## 2013-10-28 ENCOUNTER — Other Ambulatory Visit: Payer: Self-pay | Admitting: Gastroenterology

## 2014-01-03 ENCOUNTER — Ambulatory Visit (INDEPENDENT_AMBULATORY_CARE_PROVIDER_SITE_OTHER): Payer: Medicare Other | Admitting: Internal Medicine

## 2014-01-03 ENCOUNTER — Encounter: Payer: Self-pay | Admitting: Internal Medicine

## 2014-01-03 VITALS — BP 127/66 | HR 88 | Temp 97.2°F | Ht 70.5 in | Wt 170.6 lb

## 2014-01-03 DIAGNOSIS — R1319 Other dysphagia: Secondary | ICD-10-CM

## 2014-01-03 DIAGNOSIS — K219 Gastro-esophageal reflux disease without esophagitis: Secondary | ICD-10-CM

## 2014-01-03 NOTE — Patient Instructions (Addendum)
Schedule an EGD with esophageal dilation  Continue Nexium daily;  May use Dexilant intermittently as well but neither simultaneously

## 2014-01-03 NOTE — Progress Notes (Signed)
Primary Care Physician:  Leonides Grills, MD Primary Gastroenterologist:  Dr. Gala Romney  Pre-Procedure History & Physical: HPI:  Peter [name] is a 80 y.o. male here for further evaluation of dysphagia. Known Schatzki's ring has been dilated on multiple occasions previously-lastly 2012. Intermittent difficulties with Breads and  Solids, particularly if he is eating in a restaurant. Reflux symptoms fairly well controlled on Nexium although he likes alternating back and forth from Nexium to Evangeline. He does not take both the same day. He states either will lose efficacy in just several days. No abdominal pain melena or hematochezia. He does smoke 10-15 cigarettes daily. He is actually gained 5 pounds since he was last seen previously.  Past Medical History  Diagnosis Date  . HTN (hypertension)   . Anxiety   . GERD (gastroesophageal reflux disease)   . Prostate disease     h/o elevated PSA, neg bx, [facility]  . Arthritis   . Sciatica     Past Surgical History  Procedure Laterality Date  . Esophagogastroduodenoscopy  2003    schatzki ring, small vascular abnormality of duodenal bulb ?Dieulafoy's lesion (ablated)  . Colonoscopy  12/2004    diverticulosis, hyperplastic polyps, hemorrhoids  . Esophagogastroduodenoscopy  11/10/10    Dr. Gala Romney dilation 2 rings with 70F  . Cataract extraction w/phaco  05/21/2012    Procedure: CATARACT EXTRACTION PHACO AND INTRAOCULAR LENS PLACEMENT (IOC);  Surgeon: Tonny Branch, MD;  Location: [facility];  Service: Ophthalmology;  Laterality: Right;  CDE 19.34    Prior to Admission medications   Medication Sig Start Date End Date Taking? Authorizing Provider  ALPRAZolam Duanne Moron) 0.5 MG tablet Take 0.5 mg by mouth at bedtime.    Yes Historical Provider, MD  aspirin EC 81 MG tablet Take 81 mg by mouth daily. Only takes two days out the week   Yes Historical Provider, MD  esomeprazole (NEXIUM) 40 MG capsule Take 40 mg by mouth daily at 12 noon.   Yes  Historical Provider, MD  finasteride (PROSCAR) 5 MG tablet Take 5 mg by mouth daily with lunch.    Yes Historical Provider, MD  ibuprofen (ADVIL,MOTRIN) 200 MG tablet Take 400 mg by mouth every 8 (eight) hours as needed. pain   Yes Historical Provider, MD  lactulose (CHRONULAC) 10 GM/15ML solution TAKE TWO TABLESPOONFULS AT BEDTIME AS NEEDED. 10/28/13  Yes Orvil Feil, NP  lisinopril-hydrochlorothiazide (PRINZIDE,ZESTORETIC) 20-12.5 MG per tablet Take 1 tablet by mouth daily.     Yes Historical Provider, MD  Tamsulosin HCl (FLOMAX) 0.4 MG CAPS Take 0.4 mg by mouth daily after supper.    Yes Historical Provider, MD  dexlansoprazole (DEXILANT) 60 MG capsule Take 1 capsule (60 mg total) by mouth daily. 04/03/13   Orvil Feil, NP    Allergies as of 01/03/2014 - Review Complete 01/03/2014  Allergen Reaction Noted  . Penicillins Rash 05/17/2012    Family History  Problem Relation Age of Onset  . Stroke Father 28    deceased  . Colon cancer Neg Hx     History   Social History  . Marital Status: Married    Spouse Name: N/A    Number of Children: 1  . Years of Education: N/A   Occupational History  . retired     Orthoptist   Social History Main Topics  . Smoking status: Current Every Day Smoker -- 0.50 packs/day for 52 years    Types: Cigarettes  . Smokeless tobacco: Never Used  . Alcohol  Use: No     Comment: quit 8  years ago  . Drug Use: No  . Sexual Activity: Not Currently    Partners: Female    Birth Control/ Protection: None     Comment: spouse, has to use viagra   Other Topics Concern  . Not on file   Social History Narrative  . No narrative on file    Review of Systems: See HPI, otherwise negative ROS  Physical Exam: BP 127/66  Pulse 88  Temp(Src) 97.2 F (36.2 C) (Oral)  Ht 5' 10.5" (1.791 m)  Wt 170 lb 9.6 oz (77.384 kg)  BMI 24.12 kg/m2 General:   Alert,  elderly well-nourished, pleasant and cooperative in NAD Skin:  Intact without significant lesions or  rashes. Eyes:  Sclera clear, no icterus.   Conjunctiva pink. Ears:  Normal auditory acuity. Nose:  No deformity, discharge,  or lesions. Mouth:  No deformity or lesions. Neck:  Supple; no masses or thyromegaly. No significant cervical adenopathy. Lungs:  Clear throughout to auscultation.   No wheezes, crackles, or rhonchi. No acute distress. Heart:  Regular rate and rhythm; no murmurs, clicks, rubs,  or gallops. Abdomen: Non-distended, normal bowel sounds.  Soft and nontender without appreciable mass or hepatosplenomegaly.  Pulses:  Normal pulses noted. Extremities:  Without clubbing or edema.  Impression:  Pleasant 80 year old gentleman recurrent esophageal dysphagia setting of known Schatzki's ring. GERD fairly well controlled with patient's normal regimen of alternating Dexilant and Nexium. We discussed further conservative diagnostic imaging such as esophagram. However, he has a known schatzkisl ring. I think the best approach would be to go directly to EGD with dilation as feasible/appropriate. Patient is agreeable.The risks, benefits, limitations, alternatives and imponderables have been reviewed with the patient. Potential for esophageal dilation, biopsy, etc. have also been reviewed.  Questions have been answered. All parties agreeable.    Recommendations: Schedule an EGD with esophageal dilation  Continue Nexium daily;  May use Dexilant intermittently as well but neither simultaneously      Notice: This dictation was prepared with Dragon dictation along with smaller phrase technology. Any transcriptional errors that result from this process are unintentional and may not be corrected upon review.

## 2014-01-13 ENCOUNTER — Encounter (HOSPITAL_COMMUNITY): Payer: Self-pay | Admitting: Pharmacy Technician

## 2014-01-29 ENCOUNTER — Encounter (HOSPITAL_COMMUNITY): Payer: Self-pay | Admitting: *Deleted

## 2014-01-29 ENCOUNTER — Encounter (HOSPITAL_COMMUNITY)
Admission: RE | Disposition: A | Discharge status: Home / Self Care | Payer: Self-pay | Source: Ambulatory Visit | Attending: Internal Medicine

## 2014-01-29 ENCOUNTER — Ambulatory Visit (HOSPITAL_COMMUNITY)
Admission: RE | Admit: 2014-01-29 | Discharge: 2014-01-29 | Disposition: A | Discharge status: Home / Self Care | Payer: Medicare Other | Source: Ambulatory Visit | Attending: Internal Medicine | Admitting: Internal Medicine

## 2014-01-29 DIAGNOSIS — I10 Essential (primary) hypertension: Secondary | ICD-10-CM | POA: Insufficient documentation

## 2014-01-29 DIAGNOSIS — K222 Esophageal obstruction: Secondary | ICD-10-CM | POA: Insufficient documentation

## 2014-01-29 DIAGNOSIS — K2940 Chronic atrophic gastritis without bleeding: Secondary | ICD-10-CM | POA: Insufficient documentation

## 2014-01-29 DIAGNOSIS — F411 Generalized anxiety disorder: Secondary | ICD-10-CM | POA: Insufficient documentation

## 2014-01-29 DIAGNOSIS — R1319 Other dysphagia: Secondary | ICD-10-CM

## 2014-01-29 DIAGNOSIS — Z7982 Long term (current) use of aspirin: Secondary | ICD-10-CM | POA: Insufficient documentation

## 2014-01-29 DIAGNOSIS — K449 Diaphragmatic hernia without obstruction or gangrene: Secondary | ICD-10-CM | POA: Insufficient documentation

## 2014-01-29 DIAGNOSIS — Z79899 Other long term (current) drug therapy: Secondary | ICD-10-CM | POA: Insufficient documentation

## 2014-01-29 DIAGNOSIS — K219 Gastro-esophageal reflux disease without esophagitis: Secondary | ICD-10-CM

## 2014-01-29 DIAGNOSIS — F17200 Nicotine dependence, unspecified, uncomplicated: Secondary | ICD-10-CM | POA: Insufficient documentation

## 2014-01-29 HISTORY — PX: BIOPSY: SHX5522

## 2014-01-29 HISTORY — PX: ESOPHAGOGASTRODUODENOSCOPY: SHX5428

## 2014-01-29 SURGERY — EGD (ESOPHAGOGASTRODUODENOSCOPY)
Anesthesia: Moderate Sedation

## 2014-01-29 MED ORDER — LIDOCAINE VISCOUS 2 % MT SOLN
OROMUCOSAL | Status: AC
  Filled 2014-01-29: qty 15

## 2014-01-29 MED ORDER — MEPERIDINE HCL 100 MG/ML IJ SOLN
INTRAMUSCULAR | Status: DC | PRN
  Administered 2014-01-29: 50 mg via INTRAVENOUS
  Administered 2014-01-29: 25 mg via INTRAVENOUS
  Administered 2014-01-29: 50 mg via INTRAVENOUS

## 2014-01-29 MED ORDER — MIDAZOLAM HCL 5 MG/5ML IJ SOLN
INTRAMUSCULAR | Status: DC | PRN
  Administered 2014-01-29: 2 mg via INTRAVENOUS
  Administered 2014-01-29: 1 mg via INTRAVENOUS
  Administered 2014-01-29: 2 mg via INTRAVENOUS

## 2014-01-29 MED ORDER — SODIUM CHLORIDE 0.9 % IV SOLN
INTRAVENOUS | Status: DC
  Administered 2014-01-29: 1000 mL via INTRAVENOUS

## 2014-01-29 MED ORDER — MEPERIDINE HCL 100 MG/ML IJ SOLN
INTRAMUSCULAR | Status: AC
  Filled 2014-01-29: qty 2

## 2014-01-29 MED ORDER — STERILE WATER FOR IRRIGATION IR SOLN
Status: DC | PRN
  Administered 2014-01-29: 12:00:00

## 2014-01-29 MED ORDER — ONDANSETRON HCL 4 MG/2ML IJ SOLN
INTRAMUSCULAR | Status: AC
  Filled 2014-01-29: qty 2

## 2014-01-29 MED ORDER — MIDAZOLAM HCL 5 MG/5ML IJ SOLN
INTRAMUSCULAR | Status: AC
  Filled 2014-01-29: qty 10

## 2014-01-29 MED ORDER — ONDANSETRON HCL 4 MG/2ML IJ SOLN
INTRAMUSCULAR | Status: DC | PRN
  Administered 2014-01-29: 4 mg via INTRAVENOUS

## 2014-01-29 MED ORDER — LIDOCAINE VISCOUS 2 % MT SOLN
OROMUCOSAL | Status: DC | PRN
  Administered 2014-01-29: 1 via OROMUCOSAL

## 2014-01-29 NOTE — Op Note (Signed)
[facility] [address] [place], [address]   ENDOSCOPY PROCEDURE REPORT  PATIENT: Peter [name], Peter [name]  MR#: [id number] BIRTHDATE: 11-10-1932 , 81  yrs. old GENDER: Male ENDOSCOPIST: R.  Garfield Cornea, MD FACP FACG REFERRED BY:  Elsie Lincoln, M.D. PROCEDURE DATE:  01/29/2014 PROCEDURE:     EGD with esophageal and gastric biopsy (Esophagus dilated with passage of the scope)  INDICATIONS:     Versed 5 mg IV and Demerol 125 mg IV in divided doses. Zofran 4 mg IV. Xylocaine gel orally.  INFORMED CONSENT:   The risks, benefits, limitations, alternatives and imponderables have been discussed.  The potential for biopsy, esophogeal dilation, etc. have also been reviewed.  Questions have been answered.  All parties agreeable.  Please see the history and physical in the medical record for more information.  MEDICATIONS:  DESCRIPTION OF PROCEDURE:   The EG-[id number] ([id number])  endoscope was introduced through the mouth and advanced to the second portion of the duodenum without difficulty or limitations.  The mucosal surfaces were surveyed very carefully during advancement of the scope and upon withdrawal.  Retroflexion view of the proximal stomach and esophagogastric junction was performed.      FINDINGs:  "crepe" paper. Esophagus with a "ringed" appearance. Narrowed esophageal web at the GE junction. No esophagitis apparent. No Barrett's esophagus. No tumor. EG junction traversed with  light mild pressure with gastroscope.  Stomach empty. Small hiatal hernia. Diffuse "mosaicism" of the gastric mucosa. No ulcer or infiltrating process. Patent pylorus. Normal first and second portion of the duodenum.  A look back into the esophagus revealed multiple superficial tears and a  2 cm superficial tear at the GE junction from scope passage alone.  THERAPEUTIC / DIAGNOSTIC MANEUVERS PERFORMED:  biopsies the proximal and distal esophagus taken for histologic study Biopsies  abnormal stomach taken for histologic study. Passage of the scope resulted in multiple superficial tears in dilation of the GE junction where. No further  dedicated dilation performed.   COMPLICATIONS:  None  IMPRESSION:  Multiple esophageal rings/webs -  status post dilation with scope passage. Status post biopsy-rule out eosinophilic esophagitis. Small hiatal hernia. Abnormal gastric of uncertain significance-status post biopsy  RECOMMENDATIONS:   Swallowing precautions reviewed. Continue daily PPI. Followup on pathology.    _______________________________ R. Garfield Cornea, MD FACP [facility] eSigned:  R. Garfield Cornea, MD FACP [facility], [place] 01/29/2014 12:23 PM     CC:  PATIENT NAME:  Harbert, Fitterer MR#: [id number]

## 2014-01-29 NOTE — Discharge Instructions (Addendum)
EGD Discharge instructions Please read the instructions outlined below and refer to this sheet in the next few weeks. These discharge instructions provide you with general information on caring for yourself after you leave the hospital. Your doctor may also give you specific instructions. While your treatment has been planned according to the most current medical practices available, unavoidable complications occasionally occur. If you have any problems or questions after discharge, please call your doctor. ACTIVITY  You may resume your regular activity but move at a slower pace for the next 24 hours.   Take frequent rest periods for the next 24 hours.   Walking will help expel (get rid of) the air and reduce the bloated feeling in your abdomen.   No driving for 24 hours (because of the anesthesia (medicine) used during the test).   You may shower.   Do not sign any important legal documents or operate any machinery for 24 hours (because of the anesthesia used during the test).  NUTRITION  Drink plenty of fluids.   You may resume your normal diet.   Begin with a light meal and progress to your normal diet.   Avoid alcoholic beverages for 24 hours or as instructed by your caregiver.  MEDICATIONS  You may resume your normal medications unless your caregiver tells you otherwise.  WHAT YOU CAN EXPECT TODAY  You may experience abdominal discomfort such as a feeling of fullness or gas pains.  FOLLOW-UP  Your doctor will discuss the results of your test with you.  SEEK IMMEDIATE MEDICAL ATTENTION IF ANY OF THE FOLLOWING OCCUR:  Excessive nausea (feeling sick to your stomach) and/or vomiting.   Severe abdominal pain and distention (swelling).   Trouble swallowing.   Temperature over 101 F (37.8 C).   Rectal bleeding or vomiting of blood.     Swallowing precautions reviewed  Continue daily PPI  Further recommendations to follow pending review of pathology report

## 2014-01-29 NOTE — Interval H&P Note (Signed)
History and Physical Interval Note:  01/29/2014 11:53 AM  Peter [name]  has presented today for surgery, with the diagnosis of DYSPHAGIA  GERD  The various methods of treatment have been discussed with the patient and family. After consideration of risks, benefits and other options for treatment, the patient has consented to  Procedure(s) with comments: ESOPHAGOGASTRODUODENOSCOPY (EGD) (N/A) - 11:30 MALONEY DILATION (N/A) SAVORY DILATION (N/A) as a surgical intervention .  The patient's history has been reviewed, patient examined, no change in status, stable for surgery.  I have reviewed the patient's chart and labs.  Questions were answered to the patient's satisfaction.     Dekari Bures  No change. EGD with ED as feasible.  The risks, benefits, limitations, alternatives and imponderables have been reviewed with the patient. Potential for esophageal dilation, biopsy, etc. have also been reviewed.  Questions have been answered. All parties agreeable.

## 2014-01-29 NOTE — H&P (View-Only) (Signed)
Primary Care Physician:  Leonides Grills, MD Primary Gastroenterologist:  Dr. Gala Romney  Pre-Procedure History & Physical: HPI:  Peter [name] is a 80 y.o. male here for further evaluation of dysphagia. Known Schatzki's ring has been dilated on multiple occasions previously-lastly 2012. Intermittent difficulties with Breads and  Solids, particularly if he is eating in a restaurant. Reflux symptoms fairly well controlled on Nexium although he likes alternating back and forth from Nexium to [place]. He does not take both the same day. He states either will lose efficacy in just several days. No abdominal pain melena or hematochezia. He does smoke 10-15 cigarettes daily. He is actually gained 5 pounds since he was last seen previously.  Past Medical History  Diagnosis Date  . HTN (hypertension)   . Anxiety   . GERD (gastroesophageal reflux disease)   . Prostate disease     h/o elevated PSA, neg bx, [facility]  . Arthritis   . Sciatica     Past Surgical History  Procedure Laterality Date  . Esophagogastroduodenoscopy  2003    schatzki ring, small vascular abnormality of duodenal bulb ?Dieulafoy's lesion (ablated)  . Colonoscopy  12/2004    diverticulosis, hyperplastic polyps, hemorrhoids  . Esophagogastroduodenoscopy  11/10/10    Dr. Gala Romney dilation 2 rings with 22F  . Cataract extraction w/phaco  05/21/2012    Procedure: CATARACT EXTRACTION PHACO AND INTRAOCULAR LENS PLACEMENT (IOC);  Surgeon: Tonny Branch, MD;  Location: [facility];  Service: Ophthalmology;  Laterality: Right;  CDE 19.34    Prior to Admission medications   Medication Sig Start Date End Date Taking? Authorizing Provider  ALPRAZolam Duanne Moron) 0.5 MG tablet Take 0.5 mg by mouth at bedtime.    Yes Historical Provider, MD  aspirin EC 81 MG tablet Take 81 mg by mouth daily. Only takes two days out the week   Yes Historical Provider, MD  esomeprazole (NEXIUM) 40 MG capsule Take 40 mg by mouth daily at 12 noon.   Yes  Historical Provider, MD  finasteride (PROSCAR) 5 MG tablet Take 5 mg by mouth daily with lunch.    Yes Historical Provider, MD  ibuprofen (ADVIL,MOTRIN) 200 MG tablet Take 400 mg by mouth every 8 (eight) hours as needed. pain   Yes Historical Provider, MD  lactulose (CHRONULAC) 10 GM/15ML solution TAKE TWO TABLESPOONFULS AT BEDTIME AS NEEDED. 10/28/13  Yes Orvil Feil, NP  lisinopril-hydrochlorothiazide (PRINZIDE,ZESTORETIC) 20-12.5 MG per tablet Take 1 tablet by mouth daily.     Yes Historical Provider, MD  Tamsulosin HCl (FLOMAX) 0.4 MG CAPS Take 0.4 mg by mouth daily after supper.    Yes Historical Provider, MD  dexlansoprazole (DEXILANT) 60 MG capsule Take 1 capsule (60 mg total) by mouth daily. 04/03/13   Orvil Feil, NP    Allergies as of 01/03/2014 - Review Complete 01/03/2014  Allergen Reaction Noted  . Penicillins Rash 05/17/2012    Family History  Problem Relation Age of Onset  . Stroke Father 88    deceased  . Colon cancer Neg Hx     History   Social History  . Marital Status: Married    Spouse Name: N/A    Number of Children: 1  . Years of Education: N/A   Occupational History  . retired     Orthoptist   Social History Main Topics  . Smoking status: Current Every Day Smoker -- 0.50 packs/day for 52 years    Types: Cigarettes  . Smokeless tobacco: Never Used  . Alcohol  Use: No     Comment: quit 8  years ago  . Drug Use: No  . Sexual Activity: Not Currently    Partners: Female    Birth Control/ Protection: None     Comment: spouse, has to use viagra   Other Topics Concern  . Not on file   Social History Narrative  . No narrative on file    Review of Systems: See HPI, otherwise negative ROS  Physical Exam: BP 127/66  Pulse 88  Temp(Src) 97.2 F (36.2 C) (Oral)  Ht 5' 10.5" (1.791 m)  Wt 170 lb 9.6 oz (77.384 kg)  BMI 24.12 kg/m2 General:   Alert,  elderly well-nourished, pleasant and cooperative in NAD Skin:  Intact without significant lesions or  rashes. Eyes:  Sclera clear, no icterus.   Conjunctiva pink. Ears:  Normal auditory acuity. Nose:  No deformity, discharge,  or lesions. Mouth:  No deformity or lesions. Neck:  Supple; no masses or thyromegaly. No significant cervical adenopathy. Lungs:  Clear throughout to auscultation.   No wheezes, crackles, or rhonchi. No acute distress. Heart:  Regular rate and rhythm; no murmurs, clicks, rubs,  or gallops. Abdomen: Non-distended, normal bowel sounds.  Soft and nontender without appreciable mass or hepatosplenomegaly.  Pulses:  Normal pulses noted. Extremities:  Without clubbing or edema.  Impression:  Pleasant 80 year old gentleman recurrent esophageal dysphagia setting of known Schatzki's ring. GERD fairly well controlled with patient's normal regimen of alternating Dexilant and Nexium. We discussed further conservative diagnostic imaging such as esophagram. However, he has a known schatzkisl ring. I think the best approach would be to go directly to EGD with dilation as feasible/appropriate. Patient is agreeable.The risks, benefits, limitations, alternatives and imponderables have been reviewed with the patient. Potential for esophageal dilation, biopsy, etc. have also been reviewed.  Questions have been answered. All parties agreeable.    Recommendations: Schedule an EGD with esophageal dilation  Continue Nexium daily;  May use Dexilant intermittently as well but neither simultaneously      Notice: This dictation was prepared with Dragon dictation along with smaller phrase technology. Any transcriptional errors that result from this process are unintentional and may not be corrected upon review.

## 2014-01-30 ENCOUNTER — Encounter: Payer: Self-pay | Admitting: Internal Medicine

## 2014-01-31 ENCOUNTER — Encounter (HOSPITAL_COMMUNITY): Payer: Self-pay | Admitting: Internal Medicine

## 2014-03-13 ENCOUNTER — Encounter: Payer: Self-pay | Admitting: Internal Medicine

## 2014-03-21 ENCOUNTER — Other Ambulatory Visit: Payer: Self-pay | Admitting: Gastroenterology

## 2014-04-23 ENCOUNTER — Other Ambulatory Visit: Payer: Self-pay | Admitting: Gastroenterology

## 2014-05-02 ENCOUNTER — Ambulatory Visit: Payer: Medicare Other | Admitting: Internal Medicine

## 2014-05-09 ENCOUNTER — Encounter: Payer: Self-pay | Admitting: Internal Medicine

## 2014-05-09 ENCOUNTER — Telehealth: Payer: Self-pay

## 2014-05-09 ENCOUNTER — Ambulatory Visit (INDEPENDENT_AMBULATORY_CARE_PROVIDER_SITE_OTHER): Payer: Medicare Other | Admitting: Internal Medicine

## 2014-05-09 VITALS — BP 145/66 | HR 83 | Temp 97.0°F | Ht 70.0 in | Wt 163.0 lb

## 2014-05-09 DIAGNOSIS — R1314 Dysphagia, pharyngoesophageal phase: Secondary | ICD-10-CM

## 2014-05-09 NOTE — Progress Notes (Signed)
Primary Care Physician:  Leonides Grills, MD Primary Gastroenterologist:  Dr. Gala Romney  Pre-Procedure History & Physical: HPI:  Peter [name] is a 80 y.o. male here for followup of Schatzki's ring/GERD. He underwent EGD on July 22 of this year. Had a very friable tender appearing esophageal mucosa. It was significantly dilated with just passage of the scope. I did not pass a bougie. He states improvement dysphagia but short-lived yes be very careful with solid food these days. He does not have any typical reflux symptoms taking Dexilant 60 mg daily. Hasn't had any melena or hematochezia. No abdominal pain. Biopsies of his esophagus revealed only mild changes consistent with reflux. No evidence of EOE. Also, gastric biopsies revealed only mild inflammation in his stomach.  Past Medical History  Diagnosis Date  . HTN (hypertension)   . Anxiety   . GERD (gastroesophageal reflux disease)   . Prostate disease     h/o elevated PSA, neg bx, [facility]  . Arthritis   . Sciatica   . Hiatal hernia     small    Past Surgical History  Procedure Laterality Date  . Esophagogastroduodenoscopy  2003    schatzki ring, small vascular abnormality of duodenal bulb ?Dieulafoy's lesion (ablated)  . Colonoscopy  12/2004    diverticulosis, hyperplastic polyps, hemorrhoids  . Esophagogastroduodenoscopy  11/10/10    Dr. Gala Romney dilation 2 rings with 75F  . Cataract extraction w/phaco  05/21/2012    Procedure: CATARACT EXTRACTION PHACO AND INTRAOCULAR LENS PLACEMENT (IOC);  Surgeon: Tonny Branch, MD;  Location: [facility];  Service: Ophthalmology;  Laterality: Right;  CDE 19.34  . Esophagogastroduodenoscopy N/A 01/29/2014    Dr.Davyd Podgorski- multiple esophageal rings/webs- s/p dialation. small hiatal hernia. abnormal gastric mucosa. bx= mild chronic inactive gastritis (oxyntic mucosa), squamous mucosa with epithelial changes consistent with reflux related injury.  . Esophageal biopsy  01/29/2014    Procedure: BIOPSY;   Surgeon: Daneil Dolin, MD;  Location: [facility];  Service: Endoscopy;;  Gastric and Esophageal    Prior to Admission medications   Medication Sig Start Date End Date Taking? Authorizing Provider  ALPRAZolam Duanne Moron) 0.5 MG tablet Take 0.5 mg by mouth at bedtime.    Yes Historical Provider, MD  aspirin EC 81 MG tablet Take 81 mg by mouth daily. Only takes two days out the week   Yes Historical Provider, MD  DEXILANT 60 MG capsule TAKE (1) CAPSULE BY MOUTH ONCE DAILY. 04/23/14  Yes Orvil Feil, NP  esomeprazole (NEXIUM) 40 MG capsule Take 40 mg by mouth daily at 12 noon.   Yes Historical Provider, MD  finasteride (PROSCAR) 5 MG tablet Take 5 mg by mouth daily with lunch.    Yes Historical Provider, MD  HYDROcodone-acetaminophen (NORCO/VICODIN) 5-325 MG per tablet Take 1 tablet by mouth daily as needed for moderate pain.   Yes Historical Provider, MD  ibuprofen (ADVIL,MOTRIN) 200 MG tablet Take 400 mg by mouth every 8 (eight) hours as needed. pain   Yes Historical Provider, MD  lactulose (CHRONULAC) 10 GM/15ML solution TAKE TWO TABLESPOONFULS AT BEDTIME AS NEEDED. 03/21/14  Yes Mahala Menghini, PA-C  lisinopril-hydrochlorothiazide (PRINZIDE,ZESTORETIC) 20-12.5 MG per tablet Take 1 tablet by mouth daily.     Yes Historical Provider, MD  Tamsulosin HCl (FLOMAX) 0.4 MG CAPS Take 0.4 mg by mouth daily after supper.    Yes Historical Provider, MD    Allergies as of 05/09/2014 - Review Complete 05/09/2014  Allergen Reaction Noted  . Penicillins Rash 05/17/2012  Family History  Problem Relation Age of Onset  . Stroke Father 1    deceased  . Colon cancer Neg Hx     History   Social History  . Marital Status: Married    Spouse Name: N/A    Number of Children: 1  . Years of Education: N/A   Occupational History  . retired     Orthoptist   Social History Main Topics  . Smoking status: Current Every Day Smoker -- 0.50 packs/day for 52 years    Types: Cigarettes  . Smokeless tobacco:  Never Used  . Alcohol Use: No     Comment: quit 8  years ago  . Drug Use: No  . Sexual Activity: Not Currently    Partners: Female    Birth Control/ Protection: None     Comment: spouse, has to use viagra   Other Topics Concern  . Not on file   Social History Narrative  . No narrative on file    Review of Systems: See HPI, otherwise negative ROS  Physical Exam: BP 145/66  Pulse 83  Temp(Src) 97 F (36.1 C) (Oral)  Ht 5\' 10"  (1.778 m)  Wt 163 lb (73.936 kg)  BMI 23.39 kg/m2 General:   Alert,  elderly gentleman pleasant and cooperative in NAD Detailed examination deferred   Impression:   Recurrent dysphagia in the setting of a known schatzki's ring. He has been dilated multiple times previously. No histologic evidence of EOE although his mucosa was quite tender back in July. He really needs to have another dilation. This is been offered to him.  His wife has Alzheimer's and he tells me he has his hands "full" and needs to wait till the first of the year.  Recommendations: Continue Dexilant or Nexium daily for GERD  Swallowing precautions reviewed  Patient is to let me know when he is ready to have his esophagus dilated again    Notice: This dictation was prepared with Dragon dictation along with smaller phrase technology. Any transcriptional errors that result from this process are unintentional and may not be corrected upon review.

## 2014-05-09 NOTE — Telephone Encounter (Signed)
Called and left message for pt that we have more samples of Dexilant. ( Had only one bottle when he was in office today.) Leaving 3 more bottles of Dexilant 60 mg at front desk for pt. ( We cleaned out drawer at front that pt's had not picked up).

## 2014-05-09 NOTE — Patient Instructions (Signed)
Continue Dexilant or Nexium daily for GERD  Swallowing precautions reviewed  Let me know when you are ready to have your esophagus dilated again

## 2014-07-08 ENCOUNTER — Encounter: Payer: Self-pay | Admitting: Internal Medicine

## 2014-07-17 ENCOUNTER — Telehealth: Payer: Self-pay | Admitting: Internal Medicine

## 2014-07-17 NOTE — Telephone Encounter (Signed)
Routing to RMR for [employer]

## 2014-07-17 NOTE — Telephone Encounter (Signed)
Pt received a letter from [place] (recall list) that he needed OV prior to scheduling his EGD. He said that RMR told him that he could be triaged without coming to the office. I told him that I would have to get with RMR if he still wanted to do that since it has been since October when we seen him last. Patient then said that he was not ready to schedule his EGD at this time due to taking care of his wife and her health problems. I told him to call us back when he was ready.

## 2014-07-17 NOTE — Telephone Encounter (Signed)
Noted  

## 2014-09-05 DIAGNOSIS — Z6824 Body mass index (BMI) 24.0-24.9, adult: Secondary | ICD-10-CM | POA: Diagnosis not present

## 2014-09-05 DIAGNOSIS — F419 Anxiety disorder, unspecified: Secondary | ICD-10-CM | POA: Diagnosis not present

## 2014-09-05 DIAGNOSIS — I10 Essential (primary) hypertension: Secondary | ICD-10-CM | POA: Diagnosis not present

## 2014-09-05 DIAGNOSIS — M549 Dorsalgia, unspecified: Secondary | ICD-10-CM | POA: Diagnosis not present

## 2014-09-15 DIAGNOSIS — I10 Essential (primary) hypertension: Secondary | ICD-10-CM | POA: Diagnosis not present

## 2014-09-15 DIAGNOSIS — Z6824 Body mass index (BMI) 24.0-24.9, adult: Secondary | ICD-10-CM | POA: Diagnosis not present

## 2014-11-03 ENCOUNTER — Other Ambulatory Visit: Payer: Self-pay | Admitting: Gastroenterology

## 2014-11-04 ENCOUNTER — Ambulatory Visit (INDEPENDENT_AMBULATORY_CARE_PROVIDER_SITE_OTHER): Payer: Medicare Other | Admitting: Urology

## 2014-11-04 DIAGNOSIS — R972 Elevated prostate specific antigen [PSA]: Secondary | ICD-10-CM | POA: Diagnosis not present

## 2014-11-04 DIAGNOSIS — N401 Enlarged prostate with lower urinary tract symptoms: Secondary | ICD-10-CM | POA: Diagnosis not present

## 2014-12-05 DIAGNOSIS — M5431 Sciatica, right side: Secondary | ICD-10-CM | POA: Diagnosis not present

## 2014-12-05 DIAGNOSIS — Z6824 Body mass index (BMI) 24.0-24.9, adult: Secondary | ICD-10-CM | POA: Diagnosis not present

## 2014-12-05 DIAGNOSIS — J449 Chronic obstructive pulmonary disease, unspecified: Secondary | ICD-10-CM | POA: Diagnosis not present

## 2014-12-05 DIAGNOSIS — G894 Chronic pain syndrome: Secondary | ICD-10-CM | POA: Diagnosis not present

## 2014-12-10 DIAGNOSIS — Z6823 Body mass index (BMI) 23.0-23.9, adult: Secondary | ICD-10-CM | POA: Diagnosis not present

## 2014-12-10 DIAGNOSIS — I10 Essential (primary) hypertension: Secondary | ICD-10-CM | POA: Diagnosis not present

## 2014-12-22 ENCOUNTER — Other Ambulatory Visit: Payer: Self-pay | Admitting: Gastroenterology

## 2015-03-07 ENCOUNTER — Other Ambulatory Visit: Payer: Self-pay | Admitting: Gastroenterology

## 2015-06-18 DIAGNOSIS — Z6824 Body mass index (BMI) 24.0-24.9, adult: Secondary | ICD-10-CM | POA: Diagnosis not present

## 2015-06-18 DIAGNOSIS — E782 Mixed hyperlipidemia: Secondary | ICD-10-CM | POA: Diagnosis not present

## 2015-06-18 DIAGNOSIS — I10 Essential (primary) hypertension: Secondary | ICD-10-CM | POA: Diagnosis not present

## 2015-06-18 DIAGNOSIS — E785 Hyperlipidemia, unspecified: Secondary | ICD-10-CM | POA: Diagnosis not present

## 2015-06-18 DIAGNOSIS — Z1389 Encounter for screening for other disorder: Secondary | ICD-10-CM | POA: Diagnosis not present

## 2015-07-23 ENCOUNTER — Other Ambulatory Visit: Payer: Self-pay | Admitting: Nurse Practitioner

## 2015-07-28 ENCOUNTER — Other Ambulatory Visit: Payer: Self-pay | Admitting: Gastroenterology

## 2015-08-18 DIAGNOSIS — Z1389 Encounter for screening for other disorder: Secondary | ICD-10-CM | POA: Diagnosis not present

## 2015-08-18 DIAGNOSIS — Z6823 Body mass index (BMI) 23.0-23.9, adult: Secondary | ICD-10-CM | POA: Diagnosis not present

## 2015-08-18 DIAGNOSIS — D72829 Elevated white blood cell count, unspecified: Secondary | ICD-10-CM | POA: Diagnosis not present

## 2015-08-18 DIAGNOSIS — Z79899 Other long term (current) drug therapy: Secondary | ICD-10-CM | POA: Diagnosis not present

## 2015-08-18 DIAGNOSIS — E871 Hypo-osmolality and hyponatremia: Secondary | ICD-10-CM | POA: Diagnosis not present

## 2015-08-18 DIAGNOSIS — R52 Pain, unspecified: Secondary | ICD-10-CM | POA: Diagnosis not present

## 2015-08-18 DIAGNOSIS — K219 Gastro-esophageal reflux disease without esophagitis: Secondary | ICD-10-CM | POA: Diagnosis not present

## 2015-08-18 DIAGNOSIS — E782 Mixed hyperlipidemia: Secondary | ICD-10-CM | POA: Diagnosis not present

## 2015-08-18 DIAGNOSIS — I10 Essential (primary) hypertension: Secondary | ICD-10-CM | POA: Diagnosis not present

## 2015-10-02 ENCOUNTER — Other Ambulatory Visit: Payer: Self-pay | Admitting: Gastroenterology

## 2015-11-17 ENCOUNTER — Ambulatory Visit (INDEPENDENT_AMBULATORY_CARE_PROVIDER_SITE_OTHER): Payer: Medicare Other | Admitting: Urology

## 2015-11-17 DIAGNOSIS — N401 Enlarged prostate with lower urinary tract symptoms: Secondary | ICD-10-CM

## 2016-02-24 DIAGNOSIS — Z1389 Encounter for screening for other disorder: Secondary | ICD-10-CM | POA: Diagnosis not present

## 2016-02-24 DIAGNOSIS — Z6823 Body mass index (BMI) 23.0-23.9, adult: Secondary | ICD-10-CM | POA: Diagnosis not present

## 2016-02-24 DIAGNOSIS — N400 Enlarged prostate without lower urinary tract symptoms: Secondary | ICD-10-CM | POA: Diagnosis not present

## 2016-02-24 DIAGNOSIS — I10 Essential (primary) hypertension: Secondary | ICD-10-CM | POA: Diagnosis not present

## 2016-02-24 DIAGNOSIS — G894 Chronic pain syndrome: Secondary | ICD-10-CM | POA: Diagnosis not present

## 2016-04-04 ENCOUNTER — Telehealth: Payer: Self-pay

## 2016-04-04 MED ORDER — PANTOPRAZOLE SODIUM 40 MG PO TBEC: 40.0000 mg | DELAYED_RELEASE_TABLET | Freq: Every day | ORAL | 11 refills | Status: DC

## 2016-04-04 NOTE — Telephone Encounter (Signed)
Pt is aware. I have sent in rx to [facility].

## 2016-04-04 NOTE — Telephone Encounter (Signed)
Pt came by the office- he was last seen in 2015 and was told to call when he needed an EGD. Pt said he has some problems with food getting stuck in his lower esophagus. It doesn't happen all the time and pt is chewing food very well and sitting upright when eating. He did not get the dexilant d/t his 176.00 deductible and 95.00 copay. I informed him that he should have called and we could have sent in a new medication. He has tried omeprazole and nexium and protonix in the past. He asked for a box of dexilant. I have given him one box. He said NUR gave him protonix in the past and it worked well then. Pt said RMR told him he could be triaged for his procedure when he needed it done and didn't have to come in for an office visit.   Dr.Rourk- do you want pt to have an EGD/Dilation? Can he just be triaged? And can pt try a generic ppi?

## 2016-04-04 NOTE — Telephone Encounter (Signed)
Okay with Dexilant samples. Call in a prescription for Protonix 40 mg daily. Dispensed 30. 11 refills. If patient decides he needs his esophagus dilated once again, we can triage him without an office visit.

## 2016-04-13 DIAGNOSIS — H6123 Impacted cerumen, bilateral: Secondary | ICD-10-CM | POA: Diagnosis not present

## 2016-04-13 DIAGNOSIS — L570 Actinic keratosis: Secondary | ICD-10-CM | POA: Diagnosis not present

## 2016-04-13 DIAGNOSIS — C44209 Unspecified malignant neoplasm of skin of left ear and external auricular canal: Secondary | ICD-10-CM | POA: Diagnosis not present

## 2016-04-13 DIAGNOSIS — Z6822 Body mass index (BMI) 22.0-22.9, adult: Secondary | ICD-10-CM | POA: Diagnosis not present

## 2016-05-03 ENCOUNTER — Other Ambulatory Visit: Payer: Self-pay | Admitting: Gastroenterology

## 2016-07-02 ENCOUNTER — Other Ambulatory Visit: Payer: Self-pay | Admitting: Gastroenterology

## 2016-07-06 NOTE — Telephone Encounter (Signed)
Has not been seen in two years. For ongoing refills, patient needs an OV. I gave 2 refills of enulose.

## 2016-08-16 ENCOUNTER — Other Ambulatory Visit: Payer: Self-pay | Admitting: Gastroenterology

## 2016-08-18 NOTE — Telephone Encounter (Signed)
Refill X 1. Patient has not been seen since 2015. He will need ov prior to further refills or he can get from PCP.

## 2016-09-09 DIAGNOSIS — R070 Pain in throat: Secondary | ICD-10-CM | POA: Diagnosis not present

## 2016-09-09 DIAGNOSIS — J029 Acute pharyngitis, unspecified: Secondary | ICD-10-CM | POA: Diagnosis not present

## 2016-09-09 DIAGNOSIS — J069 Acute upper respiratory infection, unspecified: Secondary | ICD-10-CM | POA: Diagnosis not present

## 2016-09-09 DIAGNOSIS — Z1389 Encounter for screening for other disorder: Secondary | ICD-10-CM | POA: Diagnosis not present

## 2016-09-09 DIAGNOSIS — K219 Gastro-esophageal reflux disease without esophagitis: Secondary | ICD-10-CM | POA: Diagnosis not present

## 2016-09-09 DIAGNOSIS — J343 Hypertrophy of nasal turbinates: Secondary | ICD-10-CM | POA: Diagnosis not present

## 2016-09-21 DIAGNOSIS — J329 Chronic sinusitis, unspecified: Secondary | ICD-10-CM | POA: Diagnosis not present

## 2016-09-21 DIAGNOSIS — I10 Essential (primary) hypertension: Secondary | ICD-10-CM | POA: Diagnosis not present

## 2016-09-21 DIAGNOSIS — Z0001 Encounter for general adult medical examination with abnormal findings: Secondary | ICD-10-CM | POA: Diagnosis not present

## 2016-09-21 DIAGNOSIS — E782 Mixed hyperlipidemia: Secondary | ICD-10-CM | POA: Diagnosis not present

## 2016-09-21 DIAGNOSIS — Z1389 Encounter for screening for other disorder: Secondary | ICD-10-CM | POA: Diagnosis not present

## 2016-09-21 DIAGNOSIS — R5383 Other fatigue: Secondary | ICD-10-CM | POA: Diagnosis not present

## 2016-09-22 DIAGNOSIS — D72829 Elevated white blood cell count, unspecified: Secondary | ICD-10-CM | POA: Diagnosis not present

## 2016-10-04 ENCOUNTER — Encounter (HOSPITAL_COMMUNITY): Payer: Medicare Other | Attending: Adult Health | Admitting: Adult Health

## 2016-10-04 ENCOUNTER — Encounter (HOSPITAL_COMMUNITY): Payer: Medicare Other

## 2016-10-04 ENCOUNTER — Encounter (HOSPITAL_COMMUNITY): Payer: Self-pay | Admitting: Adult Health

## 2016-10-04 VITALS — BP 144/49 | HR 98 | Temp 97.4°F | Resp 18 | Ht 70.0 in | Wt 135.0 lb

## 2016-10-04 DIAGNOSIS — D75839 Thrombocytosis, unspecified: Secondary | ICD-10-CM

## 2016-10-04 DIAGNOSIS — D729 Disorder of white blood cells, unspecified: Secondary | ICD-10-CM | POA: Diagnosis not present

## 2016-10-04 DIAGNOSIS — D473 Essential (hemorrhagic) thrombocythemia: Secondary | ICD-10-CM | POA: Insufficient documentation

## 2016-10-04 DIAGNOSIS — D72829 Elevated white blood cell count, unspecified: Secondary | ICD-10-CM | POA: Diagnosis not present

## 2016-10-04 DIAGNOSIS — F32A Depression, unspecified: Secondary | ICD-10-CM

## 2016-10-04 DIAGNOSIS — F329 Major depressive disorder, single episode, unspecified: Secondary | ICD-10-CM | POA: Insufficient documentation

## 2016-10-04 LAB — COMPREHENSIVE METABOLIC PANEL WITH GFR
ALT: 16 U/L — ABNORMAL LOW (ref 17–63)
AST: 19 U/L (ref 15–41)
Albumin: 3.6 g/dL (ref 3.5–5.0)
Alkaline Phosphatase: 92 U/L (ref 38–126)
Anion gap: 8 (ref 5–15)
BUN: 10 mg/dL (ref 6–20)
CO2: 28 mmol/L (ref 22–32)
Calcium: 9.7 mg/dL (ref 8.9–10.3)
Chloride: 93 mmol/L — ABNORMAL LOW (ref 101–111)
Creatinine, Ser: 0.67 mg/dL (ref 0.61–1.24)
GFR calc Af Amer: >60 mL/min
GFR calc non Af Amer: >60 mL/min
Glucose, Bld: 104 mg/dL — ABNORMAL HIGH (ref 65–99)
Potassium: 4.1 mmol/L (ref 3.5–5.1)
Sodium: 129 mmol/L — ABNORMAL LOW (ref 135–145)
Total Bilirubin: 0.9 mg/dL (ref 0.3–1.2)
Total Protein: 7.1 g/dL (ref 6.5–8.1)

## 2016-10-04 LAB — CBC WITH DIFFERENTIAL/PLATELET
BASOS ABS: 0.1 10*3/uL (ref 0.0–0.1)
BASOS PCT: 1 %
EOS ABS: 0.1 10*3/uL (ref 0.0–0.7)
EOS PCT: 1 %
HCT: 40.9 % (ref 39.0–52.0)
Hemoglobin: 14 g/dL (ref 13.0–17.0)
Lymphocytes Relative: 16 %
Lymphs Abs: 1.7 10*3/uL (ref 0.7–4.0)
MCH: 31.6 pg (ref 26.0–34.0)
MCHC: 34.2 g/dL (ref 30.0–36.0)
MCV: 92.3 fL (ref 78.0–100.0)
Monocytes Absolute: 1.2 10*3/uL — ABNORMAL HIGH (ref 0.1–1.0)
Monocytes Relative: 11 %
NEUTROS PCT: 71 %
Neutro Abs: 8 10*3/uL — ABNORMAL HIGH (ref 1.7–7.7)
PLATELETS: 339 10*3/uL (ref 150–400)
RBC: 4.43 MIL/uL (ref 4.22–5.81)
RDW: 13 % (ref 11.5–15.5)
WBC: 11 10*3/uL — AB (ref 4.0–10.5)

## 2016-10-04 LAB — FERRITIN: Ferritin: 54 ng/mL (ref 24–336)

## 2016-10-04 LAB — IRON AND TIBC
Iron: 119 ug/dL (ref 45–182)
SATURATION RATIOS: 40 % — AB (ref 17.9–39.5)
TIBC: 300 ug/dL (ref 250–450)
UIBC: 181 ug/dL

## 2016-10-04 LAB — SEDIMENTATION RATE: Sed Rate: 51 mm/h — ABNORMAL HIGH (ref 0–16)

## 2016-10-04 LAB — C-REACTIVE PROTEIN: CRP: <0.8 mg/dL

## 2016-10-04 LAB — FIBRINOGEN: Fibrinogen: 499 mg/dL — ABNORMAL HIGH (ref 210–475)

## 2016-10-04 MED ORDER — MIRTAZAPINE 15 MG PO TABS: 15.0000 mg | ORAL_TABLET | Freq: Every day | ORAL | 2 refills | Status: DC

## 2016-10-04 NOTE — Progress Notes (Signed)
[place], [place] [address]   CLINIC:  Medical Oncology/Hematology  REASON FOR CONSULTATION:  Concern for leukemia    REFERRAL FROM:  Dr. Redmond School [phone]   PRIMARY CARE PROVIDER: Leonides Grills, MD [place] [address] [phone]   HISTORY OF PRESENT ILLNESS:  Peter [name] 83 y.o. male presents today for evaluation after referral from his PCP for leukocytosis and thrombocytosis.   Labs reviewed from PCP with the following abnormalities appreciated:  Date       09/21/16 WBC 24.2 ANC 19.6 Plt 842 Calcium 10.5 PSA 11.6  09/22/16 WBC 19.2 ANC 15.2 Plt 770     Peter [name] is here today with his sister and his daughter. About 2 weeks ago, he presented to his PCP with complaints of cough/URI; his daughter was sick at that time as well.  Patient was treated with steroid injection and Z-pak. He returned a few days later and symptoms persisted; he was given another steroid injection and 10 days of doxycycline.  He completed antibiotics on Saturday, 10/01/16. Denies fever/chills.   Overall, he has been feeling very tired and fatigued. He shares that he is caring for his wife of 60+ years, who has progressively worsening Alzheimer's dementia.  States that he has felt very stressed as a result and does not eat well; he has no appetite. He has lost about 35 lbs over the past 2 years that his wife has been sick.  He was started on Amitriptyline, but he does not feel like it has helped much to help him sleep better. He has Xanax that the takes PRN for anxiety as well. He endorses that he has felt "down in the dumps" lately, particularly as it relates to his ill wife.   He currently smokes cigarettes; smokes about 1/2 ppd. He has been smoking "all my life"; at least 60+ years.    REVIEW OF SYSTEMS: Review of Systems  Constitutional: Positive for fatigue and unexpected weight change (progressive weight loss over several  months ). Negative for chills and fever.  HENT:   Positive for trouble swallowing (chronic issue; sees GI for periodic esophageal dilation per daughter).   Eyes: Negative.   Respiratory: Negative.  Negative for cough and shortness of breath.   Cardiovascular: Negative.  Negative for chest pain and leg swelling.  Gastrointestinal: Negative.  Negative for abdominal pain, blood in stool, constipation, diarrhea, nausea and vomiting.  Endocrine: Negative.   Genitourinary: Negative.  Negative for dysuria.   Musculoskeletal: Positive for back pain (chronic back pain; h/o DDD per daughter).  Skin: Negative.  Negative for rash.  Neurological: Negative.   Hematological: Negative.   Psychiatric/Behavioral: Positive for depression and sleep disturbance. The patient is nervous/anxious.      PAST MEDICAL & SURGICAL HISTORY:  Past Medical History:  Diagnosis Date  . Anxiety   . Arthritis   . BPH (benign prostatic hyperplasia)   . Chronic pain   . Depression   . GERD (gastroesophageal reflux disease)   . Hiatal hernia    small  . HTN (hypertension)   . Prostate disease    h/o elevated PSA, neg bx, [facility]  . Sciatica    Past Surgical History:  Procedure Laterality Date  . BIOPSY  01/29/2014   Procedure: BIOPSY;  Surgeon: Daneil Dolin, MD;  Location: [facility];  Service: Endoscopy;;  Gastric and Esophageal  . CATARACT EXTRACTION W/PHACO  05/21/2012   Procedure: CATARACT EXTRACTION  PHACO AND INTRAOCULAR LENS PLACEMENT (IOC);  Surgeon: Tonny Branch, MD;  Location: [facility] ORS;  Service: Ophthalmology;  Laterality: Right;  CDE 19.34  . COLONOSCOPY  12/2004   diverticulosis, hyperplastic polyps, hemorrhoids  . ESOPHAGOGASTRODUODENOSCOPY  2003   schatzki ring, small vascular abnormality of duodenal bulb ?Dieulafoy's lesion (ablated)  . ESOPHAGOGASTRODUODENOSCOPY  11/10/10   Dr. Gala Romney dilation 2 rings with 22F  . ESOPHAGOGASTRODUODENOSCOPY N/A 01/29/2014   Dr.Rourk- multiple esophageal  rings/webs- s/p dialation. small hiatal hernia. abnormal gastric mucosa. bx= mild chronic inactive gastritis (oxyntic mucosa), squamous mucosa with epithelial changes consistent with reflux related injury.     SOCIAL HISTORY:  -Married x 60+ years -1 adult daughter -Retired; previously worked at [employer] x 30+ years.  -Smokes cigarettes; currently about 0.5 ppd; been smoking at least 36 years.  Social History   Social History  . Marital status: Married    Spouse name: N/A  . Number of children: 1  . Years of education: N/A   Occupational History  . retired     Orthoptist   Social History Main Topics  . Smoking status: Current Every Day Smoker    Packs/day: 0.50    Years: 60.00    Types: Cigarettes  . Smokeless tobacco: Never Used  . Alcohol use No     Comment: quit 8  years ago  . Drug use: No  . Sexual activity: Not Currently    Partners: Female    Birth control/ protection: None     Comment: spouse, has to use viagra   Other Topics Concern  . Not on file   Social History Narrative  . No narrative on file      CURRENT MEDICATIONS:  Current Outpatient Prescriptions on File Prior to Visit  Medication Sig Dispense Refill  . ALPRAZolam (XANAX) 0.5 MG tablet Take 0.5 mg by mouth at bedtime.     Marland Kitchen aspirin EC 81 MG tablet Take 81 mg by mouth daily. Only takes two days out the week    . ENULOSE 10 GM/15ML SOLN TAKE TWO TABLESPOONFULS AT BEDTIME AS NEEDED. 946 mL 0  . finasteride (PROSCAR) 5 MG tablet Take 5 mg by mouth daily with lunch.     Marland Kitchen HYDROcodone-acetaminophen (NORCO/VICODIN) 5-325 MG per tablet Take 1 tablet by mouth daily as needed for moderate pain.    Marland Kitchen ibuprofen (ADVIL,MOTRIN) 200 MG tablet Take 400 mg by mouth every 8 (eight) hours as needed. pain    . lisinopril-hydrochlorothiazide (PRINZIDE,ZESTORETIC) 20-12.5 MG per tablet Take 1 tablet by mouth daily.      . pantoprazole (PROTONIX) 40 MG tablet Take 1 tablet (40 mg total) by mouth daily. 30 tablet  11  . Tamsulosin HCl (FLOMAX) 0.4 MG CAPS Take 0.4 mg by mouth daily after supper.      Current Facility-Administered Medications on File Prior to Visit  Medication Dose Route Frequency Provider Last Rate Last Dose  . shugarcaine ophthalmic solution    PRN Tonny Branch, MD         ALLERGIES: Allergies  Allergen Reactions  . Penicillins Rash     PERFORMANCE STATUS: 1-2 - Symptomatic; requires occasional assistance    PHYSICAL EXAM:  Vitals:   10/04/16 1145  BP: (!) 144/49  Pulse: 98  Resp: 18  Temp: 97.4 F (36.3 C)   Filed Weights   10/04/16 1145  Weight: 135 lb (61.2 kg)    Physical Exam  Constitutional: He is oriented to person, place, and time.  Chronically ill  appearing elderly male in no distress  HENT:  Head: Normocephalic.  Mouth/Throat: Oropharynx is clear and moist. No oropharyngeal exudate.  Eyes: Conjunctivae are normal. Pupils are equal, round, and reactive to light. No scleral icterus.  Neck: Normal range of motion. Neck supple.  Cardiovascular: Normal rate, regular rhythm and normal heart sounds.   Pulmonary/Chest: Effort normal and breath sounds normal. No respiratory distress.  Bilateral upper lobe rhonchi  Abdominal: Soft. Bowel sounds are normal. There is no tenderness. There is no rebound.  Musculoskeletal: Normal range of motion. He exhibits no edema.  Lymphadenopathy:    He has no cervical adenopathy.  Neurological: He is alert and oriented to person, place, and time. No cranial nerve deficit. Gait normal.  Skin: Skin is warm and dry. No rash noted.  Psychiatric: Memory and judgment normal. He exhibits a depressed mood.  Appropriately tearful at times; depressed mood.     LABORATORY DATA: Available lab data from PCP:      DIAGNOSTIC IMAGING:     ASSESSMENT & PLAN:   Leukocytosis:  -I reviewed the potential causes of leukocytosis (specifically mild neutrophilia) including but not limited to:  Any active inflammatory condition or  infection  Cigarette smoking, which may be the most common cause of mild neutrophilia  Previously diagnosed hematologic disease (such as acute and chronic leukemias, chronic myeloproliferative or myelodysplastic disease)  The presence of, and treatment for, a chronic anxiety state, panic disorder, rage, or emotional stress (eg, posttraumatic stress disorder, depression)  Presence of non-hematologic diseases known to increase neutrophil counts (eg, eclampsia, thyroid storm, hypercortisolism).  Prior splenectomy or known asplenia  Positive family history of neutrophilia  Recent vaccination   Medications - Various medications may cause neutrophilia. However,  such cases are rare and appear in the literature as isolated case reports. -Labs today: CBC with peripheral smear review, evaluation for MPD, BCR-ABL to r/o CML, CRP and ESR to look for occult inflammatory disease -Return to cancer center in about 2 weeks to review lab results together at that time.    Thrombocytosis:  -An elevated platelet count may be caused by either a cytokine-driven (reactive) mechanism, or may be the result of growth factor-independent (autonomous) overproduction of platelets by clonal/neoplastic megakaryocytes, as in one to the myeloproliferative or myelodysplastic disorders.  As mentioned, the initial diagnostic question is whether thrombocytosis is a reactive phenomenon or a marker for the presence of a hematologic disorder. Causes of reactive thrombocytosis:  Infection- 31%  Infectious plus postsurgical status- 27%  Postsurgical status- 16%  Malignancy- 9%  Postsplenectomy state- 9%  Acute blood loss or iron deficiency- 8% -Reactive thrombocytosis is a much more frequent cause of thrombocytosis than autonomous; even in cases with extreme thrombocytosis (plt count > 1,000,000/microL).  Infection- 31%  Postsplenectomy or hyposplenism- 19%  Malignancy- 14%  Trauma- 14%  Inflammation (noninfectious-  9%  Blood loss- 6%  Rebound thrombocytosis- 3%  Uncertain cause- 4% Regardless of cause, a high platelet count has the potential to be associated with vasomotor (headache, visual symptoms, lightheadedness, atypical chest pain, acral dysesthesia, erythromelalgia), thrombotic, or bleeding complications.  However, these events are much less likely to occur in association with reactive thrombocytosis than autonomous thrombocytosis. -Initial evaluation should be confirmed on repeat testing and examination of the peripheral blood smear (to exclude cases of spurious thrombocytosis).  If thrombocytosis is confirmed, then a comprehensive history and physical examination is warranted with special attention to the following:  Recent trauma or surgery  Prior surgical removal of the spleen  Local or systemic complaints suggesting infection or inflammation  Present and past history of bleeding, thrombosis, or iron deficiency  Prior diagnosis of a chronic hematologic disorder  Weight loss, fatigue, and other systemic complaints suggesting the presence of a malignancy  Medication use -Peter [name] is largely asymptomatic from the thrombocytosis. He has no history of splenectomy. We will collect labs today and review them together in an office visit in about 2 weeks time.  -Labs today: CBC with differential, CRP, ESR, ferritin, pathologist smear review, JAK2 with reflex to exon 12/13 and CALR/E12/MPL.   Adjustment disorder with depressed mood:  -Peter [name] is having a hard time with the current illness of his wife. It is affecting his ability to sleep well and eat regularly. He feels "down in the dumps." Clinically, he appears depressed.  We discussed starting Remeron to try to help address his sleep, appetite, and depression symptoms.  Explained possible side effects of initiation of treatment, as well as when we may see some positive results from this therapy.  Remeron 15 mg po QHS; recommended he take 1/2 pill x  7 days, then increase to 1 pill at bedtime. Dose-escalation often helps decrease severity of side effects of Remeron. He agreed with this plan.  I also provided emotional support today for the patient, his daughter, and his sister.  They have great family support; will consider referring Peter [name] to our social worker in the future, if needed.      Dispo:  -Return to cancer center in 2 weeks to review lab results.    -This patient was seen & evaluated by Dr. Twana First, who formulated the above plan of care. This was a shared visit consultation.  Please see her attestation documentation for additional details.    A total of 60 minutes was spent in face-to-face care of this patient, with greater than 50% of that time spent in counseling and care coordination.     Orders placed this encounter:  Orders Placed This Encounter  Procedures  . BCR-ABL1, CML/ALL, PCR, QUANT  . Ferritin  . JAK2 (INCLUDING V617F AND EXON 12), MPL,& CALR W/RFL MPN PANEL (NGS)  . CBC with Differential/Platelet  . Comprehensive metabolic panel  . C-reactive protein  . Sedimentation rate  . Pathologist smear review  . Fibrinogen  . Iron and TIBC  . JAK2 V617F, w Reflex to CALR/E12/MPL     Mike Craze, NP [place] [phone]

## 2016-10-04 NOTE — Pre-Procedure Instructions (Signed)
PCP labs

## 2016-10-04 NOTE — Patient Instructions (Signed)
[place] at [facility] Discharge Instructions  RECOMMENDATIONS MADE BY THE CONSULTANT AND ANY TEST RESULTS WILL BE SENT TO YOUR REFERRING PHYSICIAN.  You were seen today by Mike Craze NP. Take 1/2 pill of Remeron daily for 1 week then take 1 whole pill daily. Return in 2 weeks for follow up.   Thank you for choosing [place] at [facility] to provide your oncology and hematology care.  To afford each patient quality time with our provider, please arrive at least 15 minutes before your scheduled appointment time.    If you have a lab appointment with the [facility] please come in thru the  Main Entrance and check in at the main information desk  You need to re-schedule your appointment should you arrive 10 or more minutes late.  We strive to give you quality time with our providers, and arriving late affects you and other patients whose appointments are after yours.  Also, if you no show three or more times for appointments you may be dismissed from the clinic at the providers discretion.     Again, thank you for choosing [facility].  Our hope is that these requests will decrease the amount of time that you wait before being seen by our physicians.       _____________________________________________________________  Should you have questions after your visit to [facility], please contact our office at [phone] between the hours of 8:30 a.m. and 4:30 p.m.  Voicemails left after 4:30 p.m. will not be returned until the following business day.  For prescription refill requests, have your pharmacy contact our office.       Resources For Cancer Patients and their Caregivers ? American Cancer Society: Can assist with transportation, wigs, general needs, runs Look Good Feel Better.        3866076764 ? Cancer Care: Provides financial assistance, online support groups, medication/co-pay assistance.   1-800-813-HOPE 662-656-4900) ? [place] Assists [place] cancer patients and their families through emotional , educational and financial support.  769-800-0544 ? [place] DSS Where to apply for food stamps, Medicaid and utility assistance. 949-118-2898 ? RCATS: Transportation to medical appointments. 781-270-5528 ? Social Security Administration: May apply for disability if have a Stage IV cancer. 442-535-3323 (912) 409-5678 ? [employer], Disability and Transit Services: Assists with nutrition, care and transit needs. [place] Support Programs: @10RELATIVEDAYS @ > Cancer Support Group  2nd Tuesday of the month 1pm-2pm, Journey Room  > Creative Journey  3rd Tuesday of the month 1130am-1pm, Journey Room  > Look Good Feel Better  1st Wednesday of the month 10am-12 noon, Journey Room (Call [place] to register [phone])

## 2016-10-05 LAB — PATHOLOGIST SMEAR REVIEW

## 2016-10-07 LAB — BCR-ABL1, CML/ALL, PCR, QUANT

## 2016-10-13 LAB — JAK2 V617F, W REFLEX TO CALR/E12/MPL

## 2016-10-13 LAB — CALR + JAK2 E12-15 + MPL (REFLEXED)

## 2016-10-19 ENCOUNTER — Encounter (HOSPITAL_COMMUNITY): Payer: Medicare Other | Attending: Adult Health | Admitting: Oncology

## 2016-10-19 ENCOUNTER — Encounter (HOSPITAL_COMMUNITY): Payer: Self-pay

## 2016-10-19 VITALS — BP 162/62 | HR 91 | Temp 97.5°F | Resp 18 | Wt 147.3 lb

## 2016-10-19 DIAGNOSIS — D72829 Elevated white blood cell count, unspecified: Secondary | ICD-10-CM | POA: Insufficient documentation

## 2016-10-19 DIAGNOSIS — D75839 Thrombocytosis, unspecified: Secondary | ICD-10-CM

## 2016-10-19 DIAGNOSIS — D473 Essential (hemorrhagic) thrombocythemia: Secondary | ICD-10-CM | POA: Insufficient documentation

## 2016-10-19 DIAGNOSIS — F329 Major depressive disorder, single episode, unspecified: Secondary | ICD-10-CM | POA: Insufficient documentation

## 2016-10-19 NOTE — Patient Instructions (Signed)
[facility] at [facility] Discharge Instructions  RECOMMENDATIONS MADE BY THE CONSULTANT AND ANY TEST RESULTS WILL BE SENT TO YOUR REFERRING PHYSICIAN.  You were seen today by Dr. Louise Zhou Follow up in 3 months with lab work See Amy up front for appointments   Thank you for choosing [facility] at [facility] to provide your oncology and hematology care.  To afford each patient quality time with our provider, please arrive at least 15 minutes before your scheduled appointment time.    If you have a lab appointment with the Cancer Center please come in thru the  Main Entrance and check in at the main information desk  You need to re-schedule your appointment should you arrive 10 or more minutes late.  We strive to give you quality time with our providers, and arriving late affects you and other patients whose appointments are after yours.  Also, if you no show three or more times for appointments you may be dismissed from the clinic at the providers discretion.     Again, thank you for choosing [facility].  Our hope is that these requests will decrease the amount of time that you wait before being seen by our physicians.       _____________________________________________________________  Should you have questions after your visit to [facility], please contact our office at [phone] between the hours of 8:30 a.m. and 4:30 p.m.  Voicemails left after 4:30 p.m. will not be returned until the following business day.  For prescription refill requests, have your pharmacy contact our office.       Resources For Cancer Patients and their Caregivers ? American Cancer Society: Can assist with transportation, wigs, general needs, runs Look Good Feel Better.        1-888-227-6333 ? Cancer Care: Provides financial assistance, online support groups, medication/co-pay assistance.  1-800-813-HOPE (4673) ? [facility] Assists [place] cancer patients and their families through emotional , educational and financial support.  336-427-4357 ? [place] DSS Where to apply for food stamps, Medicaid and utility assistance. 336-342-1394 ? RCATS: Transportation to medical appointments. 336-347-2287 ? Social Security Administration: May apply for disability if have a Stage IV cancer. 336-342-7796 1-800-772-1213 ? [place] Aging, Disability and Transit Services: Assists with nutrition, care and transit needs. 336-349-2343  Cancer Center Support Programs: @10RELATIVEDAYS@ > Cancer Support Group  2nd Tuesday of the month 1pm-2pm, Journey Room  > Creative Journey  3rd Tuesday of the month 1130am-1pm, Journey Room  > Look Good Feel Better  1st Wednesday of the month 10am-12 noon, Journey Room (Call American Cancer Society to register 1-800-395-5775)    

## 2016-10-19 NOTE — Progress Notes (Signed)
[place], [place] / [place] [id number]  DIAGNOSIS:  Leukocytosis Thrombocytosis  CURRENT THERAPY: observation  INTERVAL HISTORY: Peter [name] 83 y.o. male returns for follow up of leukocytosis and thrombocytosis.  He is doing well today. He has been taking Marinol and has been eating well and feels great. His wife has alzheimer's so he has been stressed at home. He does have help at home. Denies chest pain, SOB, abdominal pain, bowel problems, or any other concerns.   MEDICAL HISTORY: Past Medical History:  Diagnosis Date  . Anxiety   . Arthritis   . BPH (benign prostatic hyperplasia)   . Chronic pain   . Depression   . GERD (gastroesophageal reflux disease)   . Hiatal hernia    small  . HTN (hypertension)   . Prostate disease    h/o elevated PSA, neg bx, [facility]  . Sciatica     SURGICAL HISTORY: Past Surgical History:  Procedure Laterality Date  . BIOPSY  01/29/2014   Procedure: BIOPSY;  Surgeon: Daneil Dolin, MD;  Location: [facility];  Service: Endoscopy;;  Gastric and Esophageal  . CATARACT EXTRACTION W/PHACO  05/21/2012   Procedure: CATARACT EXTRACTION PHACO AND INTRAOCULAR LENS PLACEMENT ([place]);  Surgeon: Tonny Branch, MD;  Location: [facility];  Service: Ophthalmology;  Laterality: Right;  CDE 19.34  . COLONOSCOPY  12/2004   diverticulosis, hyperplastic polyps, hemorrhoids  . ESOPHAGOGASTRODUODENOSCOPY  2003   schatzki ring, small vascular abnormality of duodenal bulb ?Dieulafoy's lesion (ablated)  . ESOPHAGOGASTRODUODENOSCOPY  11/10/10   Dr. Gala Romney dilation 2 rings with 20F  . ESOPHAGOGASTRODUODENOSCOPY N/A 01/29/2014   Dr.Rourk- multiple esophageal rings/webs- s/p dialation. small hiatal hernia. abnormal gastric mucosa. bx= mild chronic inactive gastritis (oxyntic mucosa), squamous mucosa with epithelial changes consistent with reflux related injury.    SOCIAL HISTORY: Social History   Social History  .  Marital status: Married    Spouse name: N/A  . Number of children: 1  . Years of education: N/A   Occupational History  . retired     Orthoptist   Social History Main Topics  . Smoking status: Current Every Day Smoker    Packs/day: 0.50    Years: 60.00    Types: Cigarettes  . Smokeless tobacco: Never Used  . Alcohol use No     Comment: quit 8  years ago  . Drug use: No  . Sexual activity: Not Currently    Partners: Female    Birth control/ protection: None     Comment: spouse, has to use viagra   Other Topics Concern  . Not on file   Social History Narrative  . No narrative on file    FAMILY HISTORY: Family History  Problem Relation Age of Onset  . Stroke Father 39    deceased  . Heart attack Mother   . Colon cancer Neg Hx    Review of Systems  Constitutional: Negative.        No loss of appetite  HENT: Negative.   Eyes: Negative.   Respiratory: Negative.  Negative for shortness of breath.   Cardiovascular: Negative.  Negative for chest pain.  Gastrointestinal: Negative.  Negative for abdominal pain, blood in stool, constipation, diarrhea and melena.  Genitourinary: Negative.   Musculoskeletal: Negative.   Skin: Negative.   Neurological: Negative.   Endo/Heme/Allergies: Negative.   Psychiatric/Behavioral: The patient is nervous/anxious (stress).   All other systems reviewed and are negative. 14 point review of systems  was performed and is negative except as detailed under history of present illness and above  PHYSICAL EXAMINATION ECOG PERFORMANCE STATUS: 0 - Asymptomatic  Vitals:   10/19/16 1516  BP: (!) 162/62  Pulse: 91  Resp: 18  Temp: 97.5 F (36.4 C)     Physical Exam  Constitutional: He is oriented to person, place, and time and well-developed, well-nourished, and in no distress.  HENT:  Head: Normocephalic and atraumatic.  Mouth/Throat: Oropharynx is clear and moist.  Eyes: Conjunctivae and EOM are normal. Pupils are equal, round, and  reactive to light.  Neck: Normal range of motion. Neck supple.  Cardiovascular: Normal rate, regular rhythm and normal heart sounds.   Pulmonary/Chest: Effort normal and breath sounds normal.  Abdominal: Soft. Bowel sounds are normal.  Musculoskeletal: Normal range of motion.  Neurological: He is alert and oriented to person, place, and time. Gait normal.  Skin: Skin is warm and dry.  Nursing note and vitals reviewed.  LABORATORY DATA: CBC    Component Value Date/Time   WBC 11.0 (H) 10/04/2016 1133   RBC 4.43 10/04/2016 1133   HGB 14.0 10/04/2016 1133   HCT 40.9 10/04/2016 1133   PLT 339 10/04/2016 1133   MCV 92.3 10/04/2016 1133   MCH 31.6 10/04/2016 1133   MCHC 34.2 10/04/2016 1133   RDW 13.0 10/04/2016 1133   LYMPHSABS 1.7 10/04/2016 1133   MONOABS 1.2 (H) 10/04/2016 1133   EOSABS 0.1 10/04/2016 1133   BASOSABS 0.1 10/04/2016 1133   CMP     Component Value Date/Time   NA 129 (L) 10/04/2016 1133   K 4.1 10/04/2016 1133   CL 93 (L) 10/04/2016 1133   CO2 28 10/04/2016 1133   GLUCOSE 104 (H) 10/04/2016 1133   BUN 10 10/04/2016 1133   CREATININE 0.67 10/04/2016 1133   CALCIUM 9.7 10/04/2016 1133   PROT 7.1 10/04/2016 1133   ALBUMIN 3.6 10/04/2016 1133   AST 19 10/04/2016 1133   ALT 16 (L) 10/04/2016 1133   ALKPHOS 92 10/04/2016 1133   BILITOT 0.9 10/04/2016 1133   GFRNONAA >60 10/04/2016 1133   GFRAA >60 10/04/2016 1133   PATHOLOGY:   ASSESSMENT and THERAPY PLAN:  Leukocytosis likely due to infection and previous steroid use-trending down. Thrombocytosis-reactive as an acute phase reactant- resolved. Decreased appetite on marinol.  PLAN: Patient's leukocytosis has almost completely returned to normal. Leukocytosis workup including JAK2 V617 with reflex to exon 12, MPL, CALR, quantitative BCR-ABL were negative for myeloproliferative disorder and CML respectively. His sed rate and CRP were elvated indicating inflammation, which may explain why his thrombocytosis  has resolved since platelets can act as an acute phase reactant. Continue observation of his blood counts at this time.  RTC in 3 months for follow up with repeat CBC. If leukocytosis has resolved, then I will discharge him back to his PCP per patient's wishes.    All questions were answered. The patient knows to call the clinic with any problems, questions or concerns. We can certainly see the patient much sooner if necessary.  This document serves as a record of services personally performed by Twana First, MD. It was created on her behalf by Martinique Casey, a trained medical scribe. The creation of this record is based on the scribe's personal observations and the provider's statements to them. This document has been checked and approved by the attending provider.  I have reviewed the above documentation for accuracy and completeness and I agree with the above.  This note was  electronically signed.  Martinique Sanda Klein 10/19/2016

## 2016-11-04 ENCOUNTER — Other Ambulatory Visit: Payer: Self-pay | Admitting: Gastroenterology

## 2016-11-30 DIAGNOSIS — R000 Tachycardia, unspecified: Secondary | ICD-10-CM | POA: Diagnosis not present

## 2016-11-30 DIAGNOSIS — R109 Unspecified abdominal pain: Secondary | ICD-10-CM | POA: Diagnosis not present

## 2016-11-30 DIAGNOSIS — Z6822 Body mass index (BMI) 22.0-22.9, adult: Secondary | ICD-10-CM | POA: Diagnosis not present

## 2016-11-30 DIAGNOSIS — J449 Chronic obstructive pulmonary disease, unspecified: Secondary | ICD-10-CM | POA: Diagnosis not present

## 2016-12-08 ENCOUNTER — Other Ambulatory Visit: Payer: Self-pay | Admitting: Gastroenterology

## 2016-12-20 ENCOUNTER — Emergency Department (HOSPITAL_COMMUNITY)
Admission: EM | Admit: 2016-12-20 | Discharge: 2016-12-20 | Disposition: A | Discharge status: Home / Self Care | Payer: Medicare Other | Attending: Emergency Medicine | Admitting: Emergency Medicine

## 2016-12-20 ENCOUNTER — Emergency Department (HOSPITAL_COMMUNITY): Payer: Medicare Other

## 2016-12-20 ENCOUNTER — Encounter (HOSPITAL_COMMUNITY): Payer: Self-pay | Admitting: Emergency Medicine

## 2016-12-20 DIAGNOSIS — Z7982 Long term (current) use of aspirin: Secondary | ICD-10-CM | POA: Diagnosis not present

## 2016-12-20 DIAGNOSIS — I10 Essential (primary) hypertension: Secondary | ICD-10-CM | POA: Insufficient documentation

## 2016-12-20 DIAGNOSIS — K8020 Calculus of gallbladder without cholecystitis without obstruction: Secondary | ICD-10-CM

## 2016-12-20 DIAGNOSIS — S299XXA Unspecified injury of thorax, initial encounter: Secondary | ICD-10-CM | POA: Diagnosis not present

## 2016-12-20 DIAGNOSIS — F17210 Nicotine dependence, cigarettes, uncomplicated: Secondary | ICD-10-CM | POA: Insufficient documentation

## 2016-12-20 DIAGNOSIS — Z79899 Other long term (current) drug therapy: Secondary | ICD-10-CM | POA: Diagnosis not present

## 2016-12-20 DIAGNOSIS — M79601 Pain in right arm: Secondary | ICD-10-CM | POA: Diagnosis present

## 2016-12-20 DIAGNOSIS — R0781 Pleurodynia: Secondary | ICD-10-CM

## 2016-12-20 DIAGNOSIS — R079 Chest pain, unspecified: Secondary | ICD-10-CM | POA: Diagnosis not present

## 2016-12-20 LAB — CBC WITH DIFFERENTIAL/PLATELET
Basophils Absolute: 0 10*3/uL (ref 0.0–0.1)
Basophils Relative: 0 %
Eosinophils Absolute: 0.2 10*3/uL (ref 0.0–0.7)
Eosinophils Relative: 2 %
HCT: 43.5 % (ref 39.0–52.0)
Hemoglobin: 14.7 g/dL (ref 13.0–17.0)
Lymphocytes Relative: 20 %
Lymphs Abs: 1.9 10*3/uL (ref 0.7–4.0)
MCH: 31.7 pg (ref 26.0–34.0)
MCHC: 33.8 g/dL (ref 30.0–36.0)
MCV: 93.8 fL (ref 78.0–100.0)
Monocytes Absolute: 1.1 10*3/uL — ABNORMAL HIGH (ref 0.1–1.0)
Monocytes Relative: 11 %
Neutro Abs: 6.6 10*3/uL (ref 1.7–7.7)
Neutrophils Relative %: 67 %
Platelets: 410 10*3/uL — ABNORMAL HIGH (ref 150–400)
RBC: 4.64 MIL/uL (ref 4.22–5.81)
RDW: 12.7 % (ref 11.5–15.5)
WBC: 9.9 10*3/uL (ref 4.0–10.5)

## 2016-12-20 LAB — COMPREHENSIVE METABOLIC PANEL
ALT: 12 U/L — ABNORMAL LOW (ref 17–63)
AST: 16 U/L (ref 15–41)
Albumin: 3.9 g/dL (ref 3.5–5.0)
Alkaline Phosphatase: 94 U/L (ref 38–126)
Anion gap: 11 (ref 5–15)
BUN: 8 mg/dL (ref 6–20)
CO2: 28 mmol/L (ref 22–32)
Calcium: 9.7 mg/dL (ref 8.9–10.3)
Chloride: 96 mmol/L — ABNORMAL LOW (ref 101–111)
Creatinine, Ser: 0.85 mg/dL (ref 0.61–1.24)
GFR calc Af Amer: >60 mL/min (ref >60)
GFR calc non Af Amer: >60 mL/min (ref >60)
Glucose, Bld: 127 mg/dL — ABNORMAL HIGH (ref 65–99)
Potassium: 4.1 mmol/L (ref 3.5–5.1)
Sodium: 135 mmol/L (ref 135–145)
Total Bilirubin: 0.7 mg/dL (ref 0.3–1.2)
Total Protein: 7.6 g/dL (ref 6.5–8.1)

## 2016-12-20 LAB — LIPASE, BLOOD: Lipase: 29 U/L (ref 11–51)

## 2016-12-20 MED ORDER — SODIUM CHLORIDE 0.9 % IV SOLN
INTRAVENOUS | Status: DC
  Administered 2016-12-20: 16:00:00 via INTRAVENOUS

## 2016-12-20 MED ORDER — ONDANSETRON HCL 4 MG PO TABS: 4.0000 mg | ORAL_TABLET | Freq: Four times a day (QID) | ORAL | 0 refills | Status: DC

## 2016-12-20 MED ORDER — LORAZEPAM 2 MG/ML IJ SOLN
0.5000 mg | Freq: Once | INTRAMUSCULAR | Status: AC
  Administered 2016-12-20: 0.5 mg via INTRAVENOUS
  Filled 2016-12-20: qty 1

## 2016-12-20 MED ORDER — OXYCODONE-ACETAMINOPHEN 5-325 MG PO TABS: 1.0000 | ORAL_TABLET | ORAL | 0 refills | Status: DC | PRN

## 2016-12-20 MED ORDER — HYDROMORPHONE HCL 1 MG/ML IJ SOLN
0.7500 mg | Freq: Once | INTRAMUSCULAR | Status: AC
  Administered 2016-12-20: 0.75 mg via INTRAVENOUS
  Filled 2016-12-20: qty 1

## 2016-12-20 NOTE — ED Notes (Signed)
Pt denies pain at rest, only with certain movements.

## 2016-12-20 NOTE — ED Provider Notes (Signed)
[facility] DEPT Provider Note   CSN: [id number] Arrival date & time: 12/20/16  1431  By signing my name below, I, Levester Fresh, attest that this documentation has been prepared under the direction and in the presence of Virgel Manifold, MD . Electronically Signed: Levester Fresh, Scribe. 12/20/2016. 3:55 PM.   History   Chief Complaint Chief Complaint  Patient presents with  . Arm Pain   HPI Comments Peter [name] is a 83 y.o. male with a PMHx significant for HTN, who presents to the Emergency Department with complaints of intermittent right chest pain x1 week.  Sx worsened with right arm movement and deep inspirations.  No radiation to back.  No change with eating.  No nausea or vomiting.  Sx generally resolved by rest.  Pt believes that he strained a muscle while pulling his trash can one week ago, however, notes no pain after initial incident.  No abdominal PSHx.  When present, pain is rated a 10/10 in severity.  Pt reports no symptomatic improvement with hydrocodone.  He was recently worked up for a leukocytosis and thrombocytosis with concerns for leukemia, which resolved and was presumed to be due to an infection.  The history is provided by the patient and medical records. No language interpreter was used.    Past Medical History:  Diagnosis Date  . Anxiety   . Arthritis   . BPH (benign prostatic hyperplasia)   . Chronic pain   . Depression   . GERD (gastroesophageal reflux disease)   . Hiatal hernia    small  . HTN (hypertension)   . Prostate disease    h/o elevated PSA, neg bx, [facility]  . Sciatica     Patient Active Problem List   Diagnosis Date Noted  . Irregular heart beat 04/03/2013  . Constipation 05/16/2011  . Esophageal dysphagia 11/03/2010  . GERD (gastroesophageal reflux disease) 11/03/2010  . Schatzki's ring 11/03/2010    Past Surgical History:  Procedure Laterality Date  . BIOPSY  01/29/2014   Procedure: BIOPSY;  Surgeon: Daneil Dolin, MD;  Location: [facility];  Service: Endoscopy;;  Gastric and Esophageal  . CATARACT EXTRACTION W/PHACO  05/21/2012   Procedure: CATARACT EXTRACTION PHACO AND INTRAOCULAR LENS PLACEMENT ([place]);  Surgeon: Tonny Branch, MD;  Location: [facility];  Service: Ophthalmology;  Laterality: Right;  CDE 19.34  . COLONOSCOPY  12/2004   diverticulosis, hyperplastic polyps, hemorrhoids  . ESOPHAGOGASTRODUODENOSCOPY  2003   schatzki ring, small vascular abnormality of duodenal bulb ?Dieulafoy's lesion (ablated)  . ESOPHAGOGASTRODUODENOSCOPY  11/10/10   Dr. Gala Romney dilation 2 rings with 8F  . ESOPHAGOGASTRODUODENOSCOPY N/A 01/29/2014   Dr.Rourk- multiple esophageal rings/webs- s/p dialation. small hiatal hernia. abnormal gastric mucosa. bx= mild chronic inactive gastritis (oxyntic mucosa), squamous mucosa with epithelial changes consistent with reflux related injury.       Home Medications    Prior to Admission medications   Medication Sig Start Date End Date Taking? Authorizing Provider  ALPRAZolam Duanne Moron) 0.5 MG tablet Take 0.5 mg by mouth at bedtime.     [provider]  aspirin EC 81 MG tablet Take 81 mg by mouth daily. Only takes two days out the week    [provider]  ENULOSE 10 GM/15ML SOLN TAKE TWO TABLESPOONFULS AT BEDTIME AS NEEDED. 12/08/16   Carlis Stable, NP  finasteride (PROSCAR) 5 MG tablet Take 5 mg by mouth daily with lunch.     [provider]  HYDROcodone-acetaminophen (NORCO/VICODIN) 5-325 MG  per tablet Take 1 tablet by mouth daily as needed for moderate pain.    [provider]  ibuprofen (ADVIL,MOTRIN) 200 MG tablet Take 400 mg by mouth every 8 (eight) hours as needed. pain    [provider]  lisinopril-hydrochlorothiazide (PRINZIDE,ZESTORETIC) 20-12.5 MG per tablet Take 1 tablet by mouth daily.      [provider]  mirtazapine (REMERON) 15 MG tablet Take 1 tablet (15 mg total) by mouth at bedtime. 10/04/16   Holley Bouche,  NP  pantoprazole (PROTONIX) 40 MG tablet Take 1 tablet (40 mg total) by mouth daily. 04/04/16   Rourk, Cristopher Estimable, MD  Tamsulosin HCl (FLOMAX) 0.4 MG CAPS Take 0.4 mg by mouth daily after supper.     [provider]    Family History Family History  Problem Relation Age of Onset  . Stroke Father 21       deceased  . Heart attack Mother   . Colon cancer Neg Hx     Social History Social History  Substance Use Topics  . Smoking status: Current Every Day Smoker    Packs/day: 0.50    Years: 60.00    Types: Cigarettes  . Smokeless tobacco: Never Used  . Alcohol use No     Comment: quit 8  years ago     Allergies   Penicillins   Review of Systems Review of Systems  Cardiovascular: Positive for chest pain.  Gastrointestinal: Negative for nausea and vomiting.  Musculoskeletal: Negative for back pain.  Skin: Positive for rash. Negative for wound.  All other systems reviewed and are negative.    Physical Exam Updated Vital Signs BP (!) 171/62 (BP Location: Right Arm)   Pulse 84   Temp 97.6 F (36.4 C) (Oral)   Resp 18   Ht 5\' 10"  (1.778 m)   Wt 150 lb (68 kg)   SpO2 99%   BMI 21.52 kg/m   Physical Exam  Constitutional: He is oriented to person, place, and time. He appears well-developed and well-nourished.  Occasionally reacts in severe pain.  HENT:  Head: Normocephalic and atraumatic.  Eyes: EOM are normal.  Neck: Normal range of motion.  Cardiovascular: Normal rate, regular rhythm, normal heart sounds and intact distal pulses.   Pulmonary/Chest: Effort normal and breath sounds normal. No respiratory distress.  Faint erythematous rash over the lateral chest wall.  Tender.  Pain is not consistently reproducible  with palpation of the chest wall, but does seem to be with palpation of the RUQ.    Abdominal: Soft. He exhibits no distension.  Musculoskeletal: Normal range of motion.  Neurological: He is alert and oriented to person, place, and time.  Skin:  Skin is warm and dry.  Psychiatric: He has a normal mood and affect. Judgment normal.  Nursing note and vitals reviewed.   ED Treatments / Results  DIAGNOSTIC STUDIES: Oxygen Saturation is 99% on room air, normal by my interpretation.    COORDINATION OF CARE: 3:35 PM Discussed treatment plan with pt at bedside and pt agreed to plan.  Labs (all labs ordered are listed, but only abnormal results are displayed) Labs Reviewed  COMPREHENSIVE METABOLIC PANEL - Abnormal; Notable for the following:       Result Value   Chloride 96 (*)    Glucose, Bld 127 (*)    ALT 12 (*)    All other components within normal limits  CBC WITH DIFFERENTIAL/PLATELET - Abnormal; Notable for the following:    Platelets 410 (*)  Monocytes Absolute 1.1 (*)    All other components within normal limits  LIPASE, BLOOD    EKG  EKG Interpretation None       Radiology Dg Ribs Unilateral W/chest Right  Result Date: 12/20/2016 CLINICAL DATA:  Pt states last week was pulling the trash can to road and it fell over twisting his r arm. Pt c/o pain is getting worse to under right arm. Pain worse with movement was 2 weeks ago. Hx of COPD. Pt states that pain comes and goes. Hurt when lying down. Pain to the lateral and back side of his right side. EXAM: RIGHT RIBS AND CHEST - 3+ VIEW COMPARISON:  04/22/2009 FINDINGS: No rib fracture or rib lesion.  Bones are demineralized. The heart, mediastinum and hila are unremarkable. Lungs are hyperexpanded but clear. No pleural effusion or pneumothorax. IMPRESSION: 1. No rib fracture or rib lesion. 2. No acute cardiopulmonary disease. Electronically Signed   By: Lajean Manes M.D.   On: 12/20/2016 15:29    Procedures Procedures (including critical care time)  Medications Ordered in ED Medications  0.9 %  sodium chloride infusion (not administered)  HYDROmorphone (DILAUDID) injection 0.75 mg (not administered)  LORazepam (ATIVAN) injection 0.5 mg (not administered)      Initial Impression / Assessment and Plan / ED Course  I have reviewed the triage vital signs and the nursing notes.  Pertinent labs & imaging results that were available during my care of the patient were reviewed by me and considered in my medical decision making (see chart for details).     84yM with R flank pain. Very tender RUQ. Suspect symptomatic cholelithiasis. Stone noted on [place]. LFTs/lipase normal. No [place] evidence of cholecystitis. Faint erythema R flank/abdomen but I suspect this may just be from him continually holding/rubbing the area. No vesicles and with how long symptoms have been going on (week +) I would expect to see more if this was shingles. I think slip when taking out trash can is non-related. He didn't have any discomfort initially and doesn't seem musculoskeletal. Pain is now reasonably controlled. PRN pain/nausea meds. Recommended FU with Dr Arnoldo Morale or other surgeon.   It has been determined that no acute conditions requiring further emergency intervention are present at this time. The patient has been advised of the diagnosis and plan. I reviewed any labs and imaging including any potential incidental findings. We have discussed signs and symptoms that warrant return to the ED and they are listed in the discharge instructions.    Final Clinical Impressions(s) / ED Diagnoses   Final diagnoses:  Rib pain on right side   I personally preformed the services scribed in my presence. The recorded information has been reviewed is accurate. Virgel Manifold, MD.  New Prescriptions New Prescriptions   No medications on file     Virgel Manifold, MD 12/21/16 1219

## 2016-12-20 NOTE — ED Notes (Signed)
ED Provider at bedside. 

## 2016-12-20 NOTE — ED Triage Notes (Signed)
Pt states last week was pulling the trash can to road and it fell over twisting his r arm. Pt c/o pain is getting worse to under right arm. Pain worse with movement.

## 2016-12-29 ENCOUNTER — Encounter: Payer: Self-pay | Admitting: General Surgery

## 2016-12-29 ENCOUNTER — Ambulatory Visit (INDEPENDENT_AMBULATORY_CARE_PROVIDER_SITE_OTHER): Payer: Medicare Other | Admitting: General Surgery

## 2016-12-29 VITALS — BP 144/67 | HR 103 | Temp 99.3°F | Resp 18 | Ht 70.0 in | Wt 148.0 lb

## 2016-12-29 DIAGNOSIS — K8020 Calculus of gallbladder without cholecystitis without obstruction: Secondary | ICD-10-CM | POA: Diagnosis not present

## 2016-12-29 DIAGNOSIS — Z6821 Body mass index (BMI) 21.0-21.9, adult: Secondary | ICD-10-CM | POA: Diagnosis not present

## 2016-12-29 DIAGNOSIS — R0989 Other specified symptoms and signs involving the circulatory and respiratory systems: Secondary | ICD-10-CM | POA: Diagnosis not present

## 2016-12-29 DIAGNOSIS — J449 Chronic obstructive pulmonary disease, unspecified: Secondary | ICD-10-CM | POA: Diagnosis not present

## 2016-12-29 NOTE — Patient Instructions (Signed)
Cholelithiasis Cholelithiasis is a form of gallbladder disease in which gallstones form in the gallbladder. The gallbladder is an organ that stores bile. Bile is made in the liver, and it helps to digest fats. Gallstones begin as small crystals and slowly grow into stones. They may cause no symptoms until the gallbladder tightens (contracts) and a gallstone is blocking the duct (gallbladder attack), which can cause pain. Cholelithiasis is also referred to as gallstones. There are two main types of gallstones:  Cholesterol stones. These are made of hardened cholesterol and are usually yellow-green in color. They are the most common type of gallstone. Cholesterol is a white, waxy, fat-like substance that is made in the liver.  Pigment stones. These are dark in color and are made of a red-yellow substance that forms when hemoglobin from red blood cells breaks down (bilirubin).  What are the causes? This condition may be caused by an imbalance in the substances that bile is made of. This can happen if the bile:  Has too much bilirubin.  Has too much cholesterol.  Does not have enough bile salts. These salts help the body absorb and digest fats.  In some cases, this condition can also be caused by the gallbladder not emptying completely or often enough. What increases the risk? The following factors may make you more likely to develop this condition:  Being male.  Having multiple pregnancies. Health care providers sometimes advise removing diseased gallbladders before future pregnancies.  Eating a diet that is heavy in fried foods, fat, and refined carbohydrates, like white bread and white rice.  Being obese.  Being older than age 48.  Prolonged use of medicines that contain male hormones (estrogen).  Having diabetes mellitus.  Rapidly losing weight.  Having a family history of gallstones.  Being of [place] or [place] descent.  Having an intestinal disease such as  Crohn disease.  Having metabolic syndrome.  Having cirrhosis.  Having severe types of anemia such as sickle cell anemia.  What are the signs or symptoms? In most cases, there are no symptoms. These are known as silent gallstones. If a gallstone blocks the bile ducts, it can cause a gallbladder attack. The main symptom of a gallbladder attack is sudden pain in the upper right abdomen. The pain usually comes at night or after eating a large meal. The pain can last for one or several hours and can spread to the right shoulder or chest. If the bile duct is blocked for more than a few hours, it can cause infection or inflammation of the gallbladder, liver, or pancreas, which may cause:  Nausea.  Vomiting.  Abdominal pain that lasts for 5 hours or more.  Fever or chills.  Yellowing of the skin or the whites of the eyes (jaundice).  Dark urine.  Light-colored stools.  How is this diagnosed? This condition may be diagnosed based on:  A physical exam.  Your medical history.  An ultrasound of your gallbladder.  CT scan.  MRI.  Blood tests to check for signs of infection or inflammation.  A scan of your gallbladder and bile ducts (biliary system) using nonharmful radioactive material and special cameras that can see the radioactive material (cholescintigram). This test checks to see how your gallbladder contracts and whether bile ducts are blocked.  Inserting a small tube with a camera on the end (endoscope) through your mouth to inspect bile ducts and check for blockages (endoscopic retrograde cholangiopancreatogram).  How is this treated? Treatment for gallstones depends on the  severity of the condition. Silent gallstones do not need treatment. If the gallstones cause a gallbladder attack or other symptoms, treatment may be required. Options for treatment include:  Surgery to remove the gallbladder (cholecystectomy). This is the most common treatment.  Medicines to dissolve  gallstones. These are most effective at treating small gallstones. You may need to take medicines for up to 6-12 months.  Shock wave treatment (extracorporeal biliary lithotripsy). In this treatment, an ultrasound machine sends shock waves to the gallbladder to break gallstones into smaller pieces. These pieces can then be passed into the intestines or be dissolved by medicine. This is rarely used.  Removing gallstones through endoscopic retrograde cholangiopancreatogram. A small basket can be attached to the endoscope and used to capture and remove gallstones.  Follow these instructions at home:  Take over-the-counter and prescription medicines only as told by your health care provider.  Maintain a healthy weight and follow a healthy diet. This includes: ? Reducing fatty foods, such as fried food. ? Reducing refined carbohydrates, like white bread and white rice. ? Increasing fiber. Aim for foods like almonds, fruit, and beans.  Keep all follow-up visits as told by your health care provider. This is important. Contact a health care provider if:  You think you have had a gallbladder attack.  You have been diagnosed with silent gallstones and you develop abdominal pain or indigestion. Get help right away if:  You have pain from a gallbladder attack that lasts for more than 2 hours.  You have abdominal pain that lasts for more than 5 hours.  You have a fever or chills.  You have persistent nausea and vomiting.  You develop jaundice.  You have dark urine or light-colored stools. Summary  Cholelithiasis (also called gallstones) is a form of gallbladder disease in which gallstones form in the gallbladder.  This condition is caused by an imbalance in the substances that make up bile. This can happen if the bile has too much cholesterol, too much bilirubin, or not enough bile salts.  You are more likely to develop this condition if you are male, pregnant, using medicines with  estrogen, obese, older than age 20, or have a family history of gallstones. You may also develop gallstones if you have diabetes, an intestinal disease, cirrhosis, or metabolic syndrome.  Treatment for gallstones depends on the severity of the condition. Silent gallstones do not need treatment.  If gallstones cause a gallbladder attack or other symptoms, treatment may be needed. The most common treatment is surgery to remove the gallbladder. This information is not intended to replace advice given to you by your health care provider. Make sure you discuss any questions you have with your health care provider. Document Released: 06/23/2005 Document Revised: 03/13/2016 Document Reviewed: 03/13/2016 Elsevier Interactive Patient Education  2017 [employer] Carotid Artery Disease The carotid arteries are the two main arteries on either side of the neck that supply blood to the brain. Carotid artery disease, also called carotid artery stenosis, is the narrowing or blockage of one or both carotid arteries. Carotid artery disease increases your risk for a stroke or a transient ischemic attack (TIA). A TIA is an episode in which a waxy, fatty substance that accumulates within the artery (plaque) blocks blood flow to the brain. A TIA is considered a "warning stroke." What are the causes?  Buildup of plaque inside the carotid arteries (atherosclerosis) (common).  A weakened outpouching in an artery (aneurysm).  Inflammation of the carotid artery (arteritis).  A  fibrous growth within the carotid artery (fibromuscular dysplasia).  Tissue death within the carotid artery due to radiation treatment (post-radiation necrosis).  Decreased blood flow due to spasms of the carotid artery (vasospasm).  Separation of the walls of the carotid artery (carotid dissection). What increases the risk?  High cholesterol (dyslipidemia).  High blood pressure  (hypertension).  Smoking.  Obesity.  Diabetes.  Family history of cardiovascular disease.  Inactivity or lack of regular exercise.  Being male. Men have an increased risk of developing atherosclerosis earlier in life than women. What are the signs or symptoms? Carotid artery disease does not cause symptoms. How is this diagnosed? Diagnosis of carotid artery disease may include:  A physical exam. Your health care provider may hear an abnormal sound (bruit) when listening to the carotid arteries.  Specific tests that look at the blood flow in the carotid arteries. These tests include: ? Carotid artery ultrasonography. ? Carotid or cerebral angiography. ? Computerized tomographic angiography (CTA). ? Magnetic resonance angiography (MRA).  How is this treated? Treatment of carotid artery disease can include a combination of treatments. Treatment options include:  Surgery. You may have: ? A carotid endarterectomy. This is a surgery to remove the blockages in the carotid arteries. ? A carotid angioplasty with stenting. This is a nonsurgical interventional procedure. A wire mesh (stent) is used to widen the blocked carotid arteries.  Medicines to control blood pressure, cholesterol, and reduce blood clotting (antiplatelet therapy).  Adjusting your diet.  Lifestyle changes such as: ? Quitting smoking. ? Exercising as tolerated or as directed by your health care provider. ? Controlling and maintaining a good blood pressure. ? Keeping cholesterol levels under control.  Follow these instructions at home:  Take medicines only as directed by your health care provider. Make sure you understand all your medicine instructions. Do not stop your medicines without talking to your health care provider.  Follow your health care provider's diet instructions. It is important to eat a healthy diet that is low in saturated fats and includes plenty of fresh fruits, vegetables, and lean meats.  High-fat, high-sodium foods as well as foods that are fried, overly processed, or have poor nutritional value should be avoided.  Maintain a healthy weight.  Stay physically active. It is recommended that you get at least 30 minutes of activity every day.  Do not use any tobacco products including cigarettes, chewing tobacco, or electronic cigarettes. If you need help quitting, ask your health care provider.  Limit alcohol use to: ? No more than 2 drinks per day for men. ? No more than 1 drink per day for nonpregnant women.  Do not use illegal drugs.  Keep all follow-up visits as directed by your health care provider. Get help right away if: You develop TIA or stroke symptoms. These include:  Sudden weakness or numbness on one side of the body, such as in the face, arm, or leg.  Sudden confusion.  Trouble speaking (aphasia) or understanding.  Sudden trouble seeing out of one or both eyes.  Sudden trouble walking.  Dizziness or feeling like you might faint.  Loss of balance or coordination.  Sudden severe headache with no known cause.  Sudden trouble swallowing (dysphagia).  If you have any of these symptoms, call your local emergency services (911 in [place] Do not drive yourself to the clinic or hospital. This is a medical emergency. This information is not intended to replace advice given to you by your health care provider. Make sure you discuss  any questions you have with your health care provider. Document Released: 09/19/2011 Document Revised: 12/03/2015 Document Reviewed: 12/26/2012 Elsevier Interactive Patient Education  2017 [employer].

## 2016-12-29 NOTE — Progress Notes (Signed)
Peter [name]; [id number]; 1933/02/04   HPI   Patient is an 83 year old white male referred by care of by Dr. Gerarda Fraction for evaluation and treatment of cholelithiasis.  The patient was in the emergency room last week for right upper quadrant abdominal pain and was found on ultrasound the gallbladder to have cholelithiasis.  He did not have evidence of acute cholecystitis by ultrasound.  His liver enzyme tests and white blood cell count were all within normal limits.  He denies any fatty food intolerance, fever, chills, or jaundice.  This was his first episode.  He is not had any right upper quadrant abdominal pain since that time. Past Medical History:  Diagnosis Date  . Anxiety   . Arthritis   . BPH (benign prostatic hyperplasia)   . Chronic pain   . Depression   . GERD (gastroesophageal reflux disease)   . Hiatal hernia    small  . HTN (hypertension)   . Prostate disease    h/o elevated PSA, neg bx, [facility]  . Sciatica     Past Surgical History:  Procedure Laterality Date  . BIOPSY  01/29/2014   Procedure: BIOPSY;  Surgeon: Daneil Dolin, MD;  Location: [facility];  Service: Endoscopy;;  Gastric and Esophageal  . CATARACT EXTRACTION W/PHACO  05/21/2012   Procedure: CATARACT EXTRACTION PHACO AND INTRAOCULAR LENS PLACEMENT ([place]);  Surgeon: Tonny Branch, MD;  Location: [facility];  Service: Ophthalmology;  Laterality: Right;  CDE 19.34  . COLONOSCOPY  12/2004   diverticulosis, hyperplastic polyps, hemorrhoids  . ESOPHAGOGASTRODUODENOSCOPY  2003   schatzki ring, small vascular abnormality of duodenal bulb ?Dieulafoy's lesion (ablated)  . ESOPHAGOGASTRODUODENOSCOPY  11/10/10   Dr. Gala Romney dilation 2 rings with 3F  . ESOPHAGOGASTRODUODENOSCOPY N/A 01/29/2014   Dr.Rourk- multiple esophageal rings/webs- s/p dialation. small hiatal hernia. abnormal gastric mucosa. bx= mild chronic inactive gastritis (oxyntic mucosa), squamous mucosa with epithelial changes consistent with reflux related  injury.    Family History  Problem Relation Age of Onset  . Stroke Father 51       deceased  . Heart attack Mother   . Colon cancer Neg Hx     Current Outpatient Prescriptions on File Prior to Visit  Medication Sig Dispense Refill  . ALPRAZolam (XANAX) 0.5 MG tablet Take 0.5 mg by mouth at bedtime.     Marland Kitchen amLODipine (NORVASC) 5 MG tablet Take 5 mg by mouth daily.     Marland Kitchen aspirin EC 81 MG tablet Take 81 mg by mouth 2 (two) times a week.     . ENULOSE 10 GM/15ML SOLN TAKE TWO TABLESPOONFULS AT BEDTIME AS NEEDED. 946 mL 1  . finasteride (PROSCAR) 5 MG tablet Take 5 mg by mouth daily with lunch.     Marland Kitchen HYDROcodone-acetaminophen (NORCO/VICODIN) 5-325 MG per tablet Take 1 tablet by mouth daily as needed for moderate pain.    Marland Kitchen ibuprofen (ADVIL,MOTRIN) 200 MG tablet Take 400 mg by mouth every 8 (eight) hours as needed. pain    . lisinopril (PRINIVIL,ZESTRIL) 40 MG tablet Take 40 mg by mouth daily.     . mirtazapine (REMERON) 15 MG tablet Take 1 tablet (15 mg total) by mouth at bedtime. 30 tablet 2  . ondansetron (ZOFRAN) 4 MG tablet Take 1 tablet (4 mg total) by mouth every 6 (six) hours. 12 tablet 0  . oxyCODONE-acetaminophen (PERCOCET/ROXICET) 5-325 MG tablet Take 1-2 tablets by mouth every 4 (four) hours as needed. 15 tablet 0  . pantoprazole (PROTONIX) 40 MG tablet  Take 1 tablet (40 mg total) by mouth daily. 30 tablet 11   Current Facility-Administered Medications on File Prior to Visit  Medication Dose Route Frequency Provider Last Rate Last Dose  . shugarcaine ophthalmic solution    PRN Tonny Branch, MD        Allergies  Allergen Reactions  . Penicillins Rash    Has patient had a PCN reaction causing immediate rash, facial/tongue/throat swelling, SOB or lightheadedness with hypotension: Yes Has patient had a PCN reaction causing severe rash involving mucus membranes or skin necrosis: No Has patient had a PCN reaction that required hospitalization: No Has patient had a PCN reaction  occurring within the last 10 years: No If all of the above answers are "NO", then may proceed with Cephalosporin use.     History  Alcohol Use No    Comment: quit 8  years ago    History  Smoking Status  . Current Every Day Smoker  . Packs/day: 0.50  . Years: 60.00  . Types: Cigarettes  Smokeless Tobacco  . Never Used    Review of Systems  Constitutional: Negative.   HENT: Negative.   Eyes: Negative.   Respiratory: Negative.   Cardiovascular: Negative.   Gastrointestinal: Negative.   Genitourinary: Negative.   Musculoskeletal: Positive for back pain.  Skin: Negative.   Neurological: Negative.   Endo/Heme/Allergies: Negative.   Psychiatric/Behavioral: Negative.     Objective   Vitals:   12/29/16 1030  BP: (!) 144/67  Pulse: (!) 103  Resp: 18  Temp: 99.3 F (37.4 C)    Physical Exam  Constitutional: He is oriented to person, place, and time and well-developed, well-nourished, and in no distress.  HENT:  Head: Normocephalic and atraumatic.  Eyes: No scleral icterus.  Neck: Normal range of motion. No JVD present.  Palpable carotid pulses, left carotid bruit appreciated.  Cardiovascular: Normal rate, regular rhythm and normal heart sounds.   No murmur heard. Pulmonary/Chest: Effort normal and breath sounds normal. He has no wheezes. He has no rales.  Abdominal: Soft. Bowel sounds are normal. He exhibits no distension. There is no tenderness. There is no rebound.  Neurological: He is alert and oriented to person, place, and time.  Skin: Skin is warm and dry.  Vitals reviewed.    ER notes reviewed.  Ultrasound report reviewed. Assessment    Cholelithiasis, currently asymptomatic.  Newly found left carotid bruit Plan    I told the patient that he did not need emergent laparoscopic cholecystectomy right now.  He does need the left carotid bruit evaluated and he has been instructed to contact Dr. Gerarda Fraction for further evaluation and treatment.

## 2017-01-03 ENCOUNTER — Other Ambulatory Visit (HOSPITAL_COMMUNITY): Payer: Self-pay | Admitting: Internal Medicine

## 2017-01-04 ENCOUNTER — Other Ambulatory Visit (HOSPITAL_COMMUNITY): Payer: Self-pay | Admitting: Internal Medicine

## 2017-01-04 DIAGNOSIS — R0989 Other specified symptoms and signs involving the circulatory and respiratory systems: Secondary | ICD-10-CM

## 2017-01-09 ENCOUNTER — Ambulatory Visit (HOSPITAL_COMMUNITY)
Admission: RE | Admit: 2017-01-09 | Discharge: 2017-01-09 | Disposition: A | Discharge status: Home / Self Care | Payer: Medicare Other | Source: Ambulatory Visit | Attending: Internal Medicine | Admitting: Internal Medicine

## 2017-01-09 DIAGNOSIS — R0989 Other specified symptoms and signs involving the circulatory and respiratory systems: Secondary | ICD-10-CM

## 2017-01-09 DIAGNOSIS — I6523 Occlusion and stenosis of bilateral carotid arteries: Secondary | ICD-10-CM | POA: Diagnosis not present

## 2017-01-10 ENCOUNTER — Ambulatory Visit (INDEPENDENT_AMBULATORY_CARE_PROVIDER_SITE_OTHER): Payer: Medicare Other | Admitting: Urology

## 2017-01-10 DIAGNOSIS — N401 Enlarged prostate with lower urinary tract symptoms: Secondary | ICD-10-CM | POA: Diagnosis not present

## 2017-01-12 ENCOUNTER — Ambulatory Visit (HOSPITAL_COMMUNITY): Payer: Medicare Other

## 2017-01-18 ENCOUNTER — Encounter (HOSPITAL_COMMUNITY): Payer: Medicare Other | Attending: Oncology | Admitting: Oncology

## 2017-01-18 ENCOUNTER — Encounter (HOSPITAL_COMMUNITY): Payer: Self-pay

## 2017-01-18 ENCOUNTER — Encounter (HOSPITAL_COMMUNITY): Payer: Medicare Other

## 2017-01-18 DIAGNOSIS — F329 Major depressive disorder, single episode, unspecified: Secondary | ICD-10-CM | POA: Diagnosis not present

## 2017-01-18 DIAGNOSIS — I10 Essential (primary) hypertension: Secondary | ICD-10-CM | POA: Diagnosis not present

## 2017-01-18 DIAGNOSIS — D72829 Elevated white blood cell count, unspecified: Secondary | ICD-10-CM | POA: Diagnosis not present

## 2017-01-18 DIAGNOSIS — G8929 Other chronic pain: Secondary | ICD-10-CM | POA: Diagnosis not present

## 2017-01-18 DIAGNOSIS — D473 Essential (hemorrhagic) thrombocythemia: Secondary | ICD-10-CM

## 2017-01-18 DIAGNOSIS — K449 Diaphragmatic hernia without obstruction or gangrene: Secondary | ICD-10-CM | POA: Diagnosis not present

## 2017-01-18 DIAGNOSIS — D75839 Thrombocytosis, unspecified: Secondary | ICD-10-CM

## 2017-01-18 DIAGNOSIS — K219 Gastro-esophageal reflux disease without esophagitis: Secondary | ICD-10-CM | POA: Diagnosis not present

## 2017-01-18 DIAGNOSIS — F17210 Nicotine dependence, cigarettes, uncomplicated: Secondary | ICD-10-CM | POA: Diagnosis not present

## 2017-01-18 DIAGNOSIS — F419 Anxiety disorder, unspecified: Secondary | ICD-10-CM | POA: Diagnosis not present

## 2017-01-18 DIAGNOSIS — N400 Enlarged prostate without lower urinary tract symptoms: Secondary | ICD-10-CM | POA: Insufficient documentation

## 2017-01-18 DIAGNOSIS — I6523 Occlusion and stenosis of bilateral carotid arteries: Secondary | ICD-10-CM | POA: Diagnosis not present

## 2017-01-18 LAB — CBC WITH DIFFERENTIAL/PLATELET
Basophils Absolute: 0.1 10*3/uL (ref 0.0–0.1)
Basophils Relative: 1 %
EOS ABS: 0.4 10*3/uL (ref 0.0–0.7)
Eosinophils Relative: 3 %
HEMATOCRIT: 41.5 % (ref 39.0–52.0)
HEMOGLOBIN: 14.1 g/dL (ref 13.0–17.0)
LYMPHS ABS: 3.3 10*3/uL (ref 0.7–4.0)
Lymphocytes Relative: 27 %
MCH: 31.3 pg (ref 26.0–34.0)
MCHC: 34 g/dL (ref 30.0–36.0)
MCV: 92 fL (ref 78.0–100.0)
MONOS PCT: 11 %
Monocytes Absolute: 1.4 10*3/uL — ABNORMAL HIGH (ref 0.1–1.0)
NEUTROS PCT: 58 %
Neutro Abs: 7.2 10*3/uL (ref 1.7–7.7)
Platelets: 351 10*3/uL (ref 150–400)
RBC: 4.51 MIL/uL (ref 4.22–5.81)
RDW: 12.4 % (ref 11.5–15.5)
WBC: 12.4 10*3/uL — ABNORMAL HIGH (ref 4.0–10.5)

## 2017-01-18 NOTE — Progress Notes (Signed)
PROGRESS NOTE  Redmond School, MD [address] / [place] [address]  DIAGNOSIS:  Leukocytosis Thrombocytosis  CURRENT THERAPY: observation  INTERVAL HISTORY: Peter [name] 83 y.o. male returns for follow up of leukocytosis and thrombocytosis.  Patient presents with his daughter today for continued follow-up. He states that he has been doing relatively well. He recently had a bilateral carotid ultrasound on 01/09/17 which demonstrated bilateral carotid bifurcation and proximal ICA plaque, resulting in 70-99% diameter right ICA stenosis, 50-69% left ICA stenosis. He has yet to see a vascular surgeon for intervention. He denies any recent infections. He continues smoke 15 cigarettes approximately a day. He denies any chest pain, shortness breath, dizziness, headaches, blurry vision, abdominal pain.  MEDICAL HISTORY: Past Medical History:  Diagnosis Date  . Anxiety   . Arthritis   . BPH (benign prostatic hyperplasia)   . Chronic pain   . Depression   . GERD (gastroesophageal reflux disease)   . Hiatal hernia    small  . HTN (hypertension)   . Prostate disease    h/o elevated PSA, neg bx, [facility]  . Sciatica     SURGICAL HISTORY: Past Surgical History:  Procedure Laterality Date  . BIOPSY  01/29/2014   Procedure: BIOPSY;  Surgeon: Daneil Dolin, MD;  [facility];  Service: Endoscopy;;  Gastric and Esophageal  . CATARACT EXTRACTION W/PHACO  05/21/2012   Procedure: CATARACT EXTRACTION PHACO AND INTRAOCULAR LENS PLACEMENT ([place]);  Surgeon: Tonny Branch, MD;  Location: [facility];  Service: Ophthalmology;  Laterality: Right;  CDE 19.34  . COLONOSCOPY  12/2004   diverticulosis, hyperplastic polyps, hemorrhoids  . ESOPHAGOGASTRODUODENOSCOPY  2003   schatzki ring, small vascular abnormality of duodenal bulb ?Dieulafoy's lesion (ablated)  . ESOPHAGOGASTRODUODENOSCOPY  11/10/10   Dr. Gala Romney dilation 2 rings with 57F  . ESOPHAGOGASTRODUODENOSCOPY N/A 01/29/2014     Dr.Rourk- multiple esophageal rings/webs- s/p dialation. small hiatal hernia. abnormal gastric mucosa. bx= mild chronic inactive gastritis (oxyntic mucosa), squamous mucosa with epithelial changes consistent with reflux related injury.    SOCIAL HISTORY: Social History   Social History  . Marital status: Married    Spouse name: N/A  . Number of children: 1  . Years of education: N/A   Occupational History  . retired     Orthoptist   Social History Main Topics  . Smoking status: Current Every Day Smoker    Packs/day: 0.50    Years: 60.00    Types: Cigarettes  . Smokeless tobacco: Never Used  . Alcohol use No     Comment: quit 8  years ago  . Drug use: No  . Sexual activity: Not Currently    Partners: Female    Birth control/ protection: None     Comment: spouse, has to use viagra   Other Topics Concern  . Not on file   Social History Narrative  . No narrative on file    FAMILY HISTORY: Family History  Problem Relation Age of Onset  . Stroke Father 59       deceased  . Heart attack Mother   . Colon cancer Neg Hx    Review of Systems  Constitutional: Negative.        No loss of appetite  HENT: Negative.   Eyes: Negative.   Respiratory: Negative.  Negative for shortness of breath.   Cardiovascular: Negative.  Negative for chest pain.  Gastrointestinal: Negative.  Negative for abdominal pain, blood in stool, constipation, diarrhea and melena.  Genitourinary:  Negative.   Musculoskeletal: Negative.   Skin: Negative.   Neurological: Negative.   Endo/Heme/Allergies: Negative.   Psychiatric/Behavioral: Negative for depression. The patient is not nervous/anxious.   All other systems reviewed and are negative. 14 point review of systems was performed and is negative except as detailed under history of present illness and above  PHYSICAL EXAMINATION ECOG PERFORMANCE STATUS: 0 - Asymptomatic  Vitals:   01/18/17 1443  BP: (!) 161/51  Pulse: 93  Resp: 20      Physical Exam  Constitutional: He is oriented to person, place, and time and well-developed, well-nourished, and in no distress.  HENT:  Head: Normocephalic and atraumatic.  Mouth/Throat: Oropharynx is clear and moist.  Eyes: Conjunctivae and EOM are normal. Pupils are equal, round, and reactive to light.  Neck: Normal range of motion. Neck supple.  Bilateral carotid bruits, R>L  Cardiovascular: Normal rate, regular rhythm and normal heart sounds.   Pulmonary/Chest: Effort normal and breath sounds normal.  Abdominal: Soft. Bowel sounds are normal.  Musculoskeletal: Normal range of motion.  Neurological: He is alert and oriented to person, place, and time. Gait normal.  Skin: Skin is warm and dry.  Nursing note and vitals reviewed.  LABORATORY DATA: CBC    Component Value Date/Time   WBC 12.4 (H) 01/18/2017 1402   RBC 4.51 01/18/2017 1402   HGB 14.1 01/18/2017 1402   HCT 41.5 01/18/2017 1402   PLT 351 01/18/2017 1402   MCV 92.0 01/18/2017 1402   MCH 31.3 01/18/2017 1402   MCHC 34.0 01/18/2017 1402   RDW 12.4 01/18/2017 1402   LYMPHSABS 3.3 01/18/2017 1402   MONOABS 1.4 (H) 01/18/2017 1402   EOSABS 0.4 01/18/2017 1402   BASOSABS 0.1 01/18/2017 1402   CMP     Component Value Date/Time   NA 135 12/20/2016 1604   K 4.1 12/20/2016 1604   CL 96 (L) 12/20/2016 1604   CO2 28 12/20/2016 1604   GLUCOSE 127 (H) 12/20/2016 1604   BUN 8 12/20/2016 1604   CREATININE 0.85 12/20/2016 1604   CALCIUM 9.7 12/20/2016 1604   PROT 7.6 12/20/2016 1604   ALBUMIN 3.9 12/20/2016 1604   AST 16 12/20/2016 1604   ALT 12 (L) 12/20/2016 1604   ALKPHOS 94 12/20/2016 1604   BILITOT 0.7 12/20/2016 1604   GFRNONAA >60 12/20/2016 1604   GFRAA >60 12/20/2016 1604   PATHOLOGY:   ASSESSMENT and THERAPY PLAN:  Leukocytosis likely due to infection and previous steroid use-stable. Thrombocytosis-reactive as an acute phase reactant- resolved. Decreased appetite on marinol. Leukocytosis  workup including JAK2 V617 with reflex to exon 12, MPL, CALR, quantitative BCR-ABL were negative for myeloproliferative disorder and CML respectively. His sed rate and CRP were elvated indicating inflammation, which may explain why his thrombocytosis has resolved since platelets can act as an acute phase reactant.   PLAN: Reviewed his labs with him today. He has a mild leukocytosis which may be related to his chronic smoking. Continue observation of his blood counts at this time. Counseled patient on smoke cessation. RTC in 6 months for follow up with repeat CBC. Advised him to see a vascular surgeon since he has severe stenosis of his carotids on the right and likely will need a carotid endarterectomy.  All questions were answered. The patient knows to call the clinic with any problems, questions or concerns. We can certainly see the patient much sooner if necessary.   This note was electronically signed.  Twana First, MD 01/18/2017

## 2017-01-23 ENCOUNTER — Other Ambulatory Visit: Payer: Self-pay

## 2017-01-23 DIAGNOSIS — I6523 Occlusion and stenosis of bilateral carotid arteries: Secondary | ICD-10-CM

## 2017-02-14 ENCOUNTER — Encounter (HOSPITAL_COMMUNITY): Payer: Medicare Other

## 2017-02-14 ENCOUNTER — Encounter: Payer: Medicare Other | Admitting: Vascular Surgery

## 2017-02-20 ENCOUNTER — Other Ambulatory Visit (HOSPITAL_COMMUNITY): Payer: Self-pay | Admitting: Adult Health

## 2017-02-20 ENCOUNTER — Other Ambulatory Visit: Payer: Self-pay | Admitting: Nurse Practitioner

## 2017-02-20 ENCOUNTER — Other Ambulatory Visit: Payer: Self-pay | Admitting: Internal Medicine

## 2017-02-20 DIAGNOSIS — F329 Major depressive disorder, single episode, unspecified: Secondary | ICD-10-CM

## 2017-02-20 DIAGNOSIS — F32A Depression, unspecified: Secondary | ICD-10-CM

## 2017-02-20 NOTE — Telephone Encounter (Signed)
Please schedule ov.  

## 2017-02-20 NOTE — Telephone Encounter (Signed)
Last seen in 2015. I will send in limited refills. Needs ov for further refills unless [facility] wants to write for them.

## 2017-02-21 ENCOUNTER — Encounter: Payer: Self-pay | Admitting: Vascular Surgery

## 2017-02-21 ENCOUNTER — Encounter: Payer: Self-pay | Admitting: Gastroenterology

## 2017-02-21 NOTE — Telephone Encounter (Signed)
APPT MADE AND LETTER SENT  °

## 2017-03-01 ENCOUNTER — Encounter: Payer: Self-pay | Admitting: Vascular Surgery

## 2017-03-01 ENCOUNTER — Encounter: Payer: Medicare Other | Admitting: Vascular Surgery

## 2017-03-01 ENCOUNTER — Ambulatory Visit (INDEPENDENT_AMBULATORY_CARE_PROVIDER_SITE_OTHER): Payer: Medicare Other | Admitting: Vascular Surgery

## 2017-03-01 ENCOUNTER — Ambulatory Visit (HOSPITAL_COMMUNITY)
Admission: RE | Admit: 2017-03-01 | Discharge: 2017-03-01 | Disposition: A | Discharge status: Home / Self Care | Payer: Medicare Other | Source: Ambulatory Visit | Attending: Vascular Surgery | Admitting: Vascular Surgery

## 2017-03-01 ENCOUNTER — Encounter (HOSPITAL_COMMUNITY): Payer: Medicare Other

## 2017-03-01 VITALS — BP 167/77 | HR 82 | Temp 97.5°F | Resp 18 | Ht 70.0 in | Wt 149.0 lb

## 2017-03-01 DIAGNOSIS — I6523 Occlusion and stenosis of bilateral carotid arteries: Secondary | ICD-10-CM

## 2017-03-01 DIAGNOSIS — I779 Disorder of arteries and arterioles, unspecified: Secondary | ICD-10-CM | POA: Diagnosis not present

## 2017-03-01 DIAGNOSIS — I739 Peripheral vascular disease, unspecified: Principal | ICD-10-CM

## 2017-03-01 LAB — VAS US CAROTID
LCCADDIAS: -18 cm/s
LCCAPSYS: 91 cm/s
LEFT ECA DIAS: -20 cm/s
LEFT VERTEBRAL DIAS: 13 cm/s
LICADDIAS: -27 cm/s
LICAPDIAS: 349 cm/s
LICAPSYS: 770 cm/s
Left CCA dist sys: -85 cm/s
Left CCA prox dias: 14 cm/s
Left ICA dist sys: -64 cm/s
RIGHT CCA MID DIAS: 14 cm/s
RIGHT ECA DIAS: 39 cm/s
RIGHT VERTEBRAL DIAS: 17 cm/s
Right CCA prox dias: -11 cm/s
Right CCA prox sys: -77 cm/s
Right cca dist sys: -67 cm/s

## 2017-03-01 NOTE — Progress Notes (Signed)
New Carotid Patient  Requested by:  Redmond School, [place], [place] [address]  Reason for consultation: bilateral carotid stenosis   History of Present Illness   Peter [name] is a 83 y.o. (September 25, 1932) male who presents with chief complaint: "got some problems with arteries".  Pt was seen in [place] with possible gallbladder disease.  His surgeon noted bruits in his neck on exam and recommended he get evaluated prior to considering any gallbladder interventions.  Previous carotid studies demonstrated: RICA 62-13% stenosis, LICA 08-65% stenosis.  Patient has no history of TIA or stroke symptom.  The patient has never had amaurosis fugax or monocular blindness.  The patient has never had facial drooping or hemiplegia.  The patient has never had receptive or expressive aphasia.     The patient's risks factors for carotid disease include: HTN, active smoking.  Past Medical History:  Diagnosis Date  . Anxiety   . Arthritis   . BPH (benign prostatic hyperplasia)   . Chronic pain   . Depression   . GERD (gastroesophageal reflux disease)   . Hiatal hernia    small  . HTN (hypertension)   . Prostate disease    h/o elevated PSA, neg bx, [facility]  . Sciatica     Past Surgical History:  Procedure Laterality Date  . BIOPSY  01/29/2014   Procedure: BIOPSY;  Surgeon: Daneil Dolin, MD;  [facility];  Service: Endoscopy;;  Gastric and Esophageal  . CATARACT EXTRACTION W/PHACO  05/21/2012   Procedure: CATARACT EXTRACTION PHACO AND INTRAOCULAR LENS PLACEMENT ([place]);  Surgeon: Tonny Branch, MD;  Location: [facility];  Service: Ophthalmology;  Laterality: Right;  CDE 19.34  . COLONOSCOPY  12/2004   diverticulosis, hyperplastic polyps, hemorrhoids  . ESOPHAGOGASTRODUODENOSCOPY  2003   schatzki ring, small vascular abnormality of duodenal bulb ?Dieulafoy's lesion (ablated)  . ESOPHAGOGASTRODUODENOSCOPY  11/10/10   Dr. Gala Romney dilation 2 rings with 65F    . ESOPHAGOGASTRODUODENOSCOPY N/A 01/29/2014   Dr.Rourk- multiple esophageal rings/webs- s/p dialation. small hiatal hernia. abnormal gastric mucosa. bx= mild chronic inactive gastritis (oxyntic mucosa), squamous mucosa with epithelial changes consistent with reflux related injury.    Social History   Social History  . Marital status: Married    Spouse name: N/A  . Number of children: 1  . Years of education: N/A   Occupational History  . retired     Orthoptist   Social History Main Topics  . Smoking status: Current Every Day Smoker    Packs/day: 0.50    Years: 60.00    Types: Cigarettes  . Smokeless tobacco: Never Used  . Alcohol use No     Comment: quit 8  years ago  . Drug use: No  . Sexual activity: Not Currently    Partners: Female    Birth control/ protection: None     Comment: spouse, has to use viagra   Other Topics Concern  . Not on file   Social History Narrative  . No narrative on file    Family History  Problem Relation Age of Onset  . Stroke Father 71       deceased  . Heart attack Mother   . Colon cancer Neg Hx     Current Outpatient Prescriptions  Medication Sig Dispense Refill  . ALPRAZolam (XANAX) 0.5 MG tablet Take 0.5 mg by mouth at bedtime.     Marland Kitchen amLODipine (NORVASC) 5 MG tablet Take 5 mg by mouth daily.     Marland Kitchen  aspirin EC 81 MG tablet Take 81 mg by mouth 2 (two) times a week.     . ENULOSE 10 GM/15ML SOLN TAKE TWO TABLESPOONFULS AT BEDTIME AS NEEDED. 946 mL 0  . finasteride (PROSCAR) 5 MG tablet Take 5 mg by mouth daily with lunch.     Marland Kitchen HYDROcodone-acetaminophen (NORCO/VICODIN) 5-325 MG per tablet Take 1 tablet by mouth daily as needed for moderate pain.    Marland Kitchen ibuprofen (ADVIL,MOTRIN) 200 MG tablet Take 400 mg by mouth every 8 (eight) hours as needed. pain    . lisinopril (PRINIVIL,ZESTRIL) 40 MG tablet Take 40 mg by mouth daily.     . mirtazapine (REMERON) 15 MG tablet TAKE (1) TABLET BY MOUTH AT BEDTIME. 30 tablet 3  . ondansetron (ZOFRAN) 4 MG  tablet Take 1 tablet (4 mg total) by mouth every 6 (six) hours. 12 tablet 0  . oxyCODONE-acetaminophen (PERCOCET/ROXICET) 5-325 MG tablet Take 1-2 tablets by mouth every 4 (four) hours as needed. 15 tablet 0  . pantoprazole (PROTONIX) 40 MG tablet TAKE (1) TABLET BY MOUTH ONCE DAILY. 30 tablet 1   No current facility-administered medications for this visit.    Facility-Administered Medications Ordered in Other Visits  Medication Dose Route Frequency Provider Last Rate Last Dose  . shugarcaine ophthalmic solution    PRN Tonny Branch, MD        Allergies  Allergen Reactions  . Penicillins Rash    Has patient had a PCN reaction causing immediate rash, facial/tongue/throat swelling, SOB or lightheadedness with hypotension: Yes Has patient had a PCN reaction causing severe rash involving mucus membranes or skin necrosis: No Has patient had a PCN reaction that required hospitalization: No Has patient had a PCN reaction occurring within the last 10 years: No If all of the above answers are "NO", then may proceed with Cephalosporin use.     REVIEW OF SYSTEMS (negative unless checked):   Cardiac:  []  Chest pain or chest pressure? []  Shortness of breath upon activity? []  Shortness of breath when lying flat? []  Irregular heart rhythm?  Vascular:  []  Pain in calf, thigh, or hip brought on by walking? []  Pain in feet at night that wakes you up from your sleep? []  Blood clot in your veins? []  Leg swelling?  Pulmonary:  []  Oxygen at home? []  Productive cough? []  Wheezing?  Neurologic:  []  Sudden weakness in arms or legs? []  Sudden numbness in arms or legs? []  Sudden onset of difficult speaking or slurred speech? []  Temporary loss of vision in one eye? []  Problems with dizziness?  Gastrointestinal:  []  Blood in stool? []  Vomited blood?  Genitourinary:  []  Burning when urinating? []  Blood in urine?  Psychiatric:  []  Major depression  Hematologic:  []  Bleeding problems? []   Problems with blood clotting?  Dermatologic:  []  Rashes or ulcers?  Constitutional:  []  Fever or chills?  Ear/Nose/Throat:  []  Change in hearing? []  Nose bleeds? []  Sore throat?  Musculoskeletal:  []  Back pain? []  Joint pain? []  Muscle pain?   For VQI Use Only   PRE-ADM LIVING Home  AMB STATUS Ambulatory  CAD Sx None  PRIOR CHF None  STRESS TEST No    Physical Examination     Vitals:   03/01/17 1359  BP: (!) 167/77  Pulse: 82  Resp: 18  Temp: (!) 97.5 F (36.4 C)  SpO2: 98%  Weight: 149 lb (67.6 kg)  Height: 5\' 10"  (1.778 m)   Body mass index is 21.38 kg/m.  General  Alert, O x 3, WD, Elderly  Head [place]/AT,    Ear/Nose/ Throat Hearing grossly intact, nares without erythema or drainage, oropharynx without Erythema or Exudate, Mallampati score: 3,   Eyes PERRLA, EOMI,    Neck Supple, mid-line trachea,    Pulmonary Sym exp, good B air movt, CTA B  Cardiac RRR, Nl S1, S2, no Murmurs, No rubs, No S3,S4  Vascular Vessel Right Left  Radial Palpable Palpable  Brachial Palpable Palpable  Carotid Palpable, Bruit present Palpable, Bruit present  Aorta Not palpable N/A  Femoral Palpable Palpable  Popliteal Not palpable Not palpable  PT Not palpable Not palpable  DP Faintly palpable Faintly palpable    Gastro- intestinal soft, non-distended, non-tender to palpation, No guarding or rebound, no HSM, no masses, no CVAT B, No palpable prominent aortic pulse,    Musculo- skeletal M/S 5/5 throughout  , Extremities without ischemic changes  , No edema present, No visible varicosities , No Lipodermatosclerosis present  Neurologic Cranial nerves 2-12 intact , Pain and light touch intact in extremities , Motor exam as listed above  Psychiatric Judgement intact, Mood & affect appropriate for pt's clinical situation  Dermatologic See M/S exam for extremity exam, No rashes otherwise noted  Lymphatic  Palpable lymph nodes: None     Non-invasive Vascular Imaging   B  Carotid Duplex (03/01/2017):  R ICA stenosis:  60-79% (371/85 c/s) R VA: patent and antegrade L ICA stenosis:  80-99% (770/349 c/s) L VA: patent and antegrade   Outside Studies/Documentation   15 pages of outside documents were reviewed including: outpatient PCP charts.   Medical Decision Making   KAISEN ACKERS is a 83 y.o. male who presents with: asx R ICA stenosis 70-79% (high end), asx L ICA stenosis >80% (near occluded).   Based on ACAS, this patient needs a L CEA and possible R CEA.  However with his elevated age and co-morbidities, will likely need Cardiology evaluation.  Based on the patient's vascular studies and examination, I have offered the patient: Cardiac clearance and likely L CEA in 2 weeks. I discussed in depth with the patient the nature of atherosclerosis, and emphasized the importance of maximal medical management including strict control of blood pressure, blood glucose, and lipid levels, obtaining regular exercise, antiplatelet agents, and cessation of smoking.   The patient is currently not on a statin.  Needs lipid panel done prior to starting statin.  The patient is currently on an anti-platelet: ASA.  The patient is aware that without maximal medical management the underlying atherosclerotic disease process will progress, limiting the benefit of any interventions.  Thank you for allowing [place] to participate in this patient's care.   [name], MD, FACS [facility] Office: [phone] Pager: [phone]  03/01/2017, 2:45 PM

## 2017-03-06 ENCOUNTER — Encounter: Payer: Self-pay | Admitting: Internal Medicine

## 2017-03-06 ENCOUNTER — Ambulatory Visit (INDEPENDENT_AMBULATORY_CARE_PROVIDER_SITE_OTHER): Payer: Medicare Other | Admitting: Internal Medicine

## 2017-03-06 VITALS — BP 140/80 | HR 89 | Ht 70.0 in | Wt 150.5 lb

## 2017-03-06 DIAGNOSIS — I10 Essential (primary) hypertension: Secondary | ICD-10-CM

## 2017-03-06 DIAGNOSIS — Z720 Tobacco use: Secondary | ICD-10-CM

## 2017-03-06 DIAGNOSIS — Z01810 Encounter for preprocedural cardiovascular examination: Secondary | ICD-10-CM

## 2017-03-06 DIAGNOSIS — I779 Disorder of arteries and arterioles, unspecified: Secondary | ICD-10-CM

## 2017-03-06 DIAGNOSIS — I739 Peripheral vascular disease, unspecified: Secondary | ICD-10-CM

## 2017-03-06 NOTE — Progress Notes (Signed)
Cardiology Office Note   Date:  03/06/2017   ID:  Kelten, Enochs Dec 06, 1932, MRN [id number]  PCP:  Redmond School, MD  Cardiologist:   Dorris Carnes, MD   Pt referred for presurgical evaluation      History of Present Illness: AVERY KLINGBEIL is a 83 y.o. male with a history of cerebrovascular dz  Follwed by Dr Bridgett Larsson.  Also a history of HTN  Pt found to have signif bilateral CV disease  He has no known CAD     Pt denies CP  Breathing is OK  SMokes less than 1 ppd  Doesn't exercise but he does do some activity Walks up/down driveway which is long and inclined    Can take trash cans up and down  No CP  Breathing OK when he does this       Current Meds  Medication Sig  . ALPRAZolam (XANAX) 0.5 MG tablet Take 0.5 mg by mouth at bedtime.   Marland Kitchen amLODipine (NORVASC) 5 MG tablet Take 5 mg by mouth daily.   Marland Kitchen aspirin EC 81 MG tablet Take 81 mg by mouth 2 (two) times a week.   . ENULOSE 10 GM/15ML SOLN TAKE TWO TABLESPOONFULS AT BEDTIME AS NEEDED.  . finasteride (PROSCAR) 5 MG tablet Take 5 mg by mouth daily with lunch.   Marland Kitchen HYDROcodone-acetaminophen (NORCO/VICODIN) 5-325 MG per tablet Take 1 tablet by mouth daily as needed for moderate pain.  Marland Kitchen lisinopril (PRINIVIL,ZESTRIL) 40 MG tablet Take 40 mg by mouth daily.   . mirtazapine (REMERON) 15 MG tablet TAKE (1) TABLET BY MOUTH AT BEDTIME.  . pantoprazole (PROTONIX) 40 MG tablet TAKE (1) TABLET BY MOUTH ONCE DAILY.     Allergies:   Penicillins   Past Medical History:  Diagnosis Date  . Anxiety   . Arthritis   . BPH (benign prostatic hyperplasia)   . Chronic pain   . Depression   . GERD (gastroesophageal reflux disease)   . Hiatal hernia    small  . HTN (hypertension)   . Prostate disease    h/o elevated PSA, neg bx, [facility]  . Sciatica     Past Surgical History:  Procedure Laterality Date  . BIOPSY  01/29/2014   Procedure: BIOPSY;  Surgeon: Daneil Dolin, MD;  [facility];  Service: Endoscopy;;   Gastric and Esophageal  . CATARACT EXTRACTION W/PHACO  05/21/2012   Procedure: CATARACT EXTRACTION PHACO AND INTRAOCULAR LENS PLACEMENT ([place]);  Surgeon: Tonny Branch, MD;  Location: [facility];  Service: Ophthalmology;  Laterality: Right;  CDE 19.34  . COLONOSCOPY  12/2004   diverticulosis, hyperplastic polyps, hemorrhoids  . ESOPHAGOGASTRODUODENOSCOPY  2003   schatzki ring, small vascular abnormality of duodenal bulb ?Dieulafoy's lesion (ablated)  . ESOPHAGOGASTRODUODENOSCOPY  11/10/10   Dr. Gala Romney dilation 2 rings with 30F  . ESOPHAGOGASTRODUODENOSCOPY N/A 01/29/2014   Dr.Rourk- multiple esophageal rings/webs- s/p dialation. small hiatal hernia. abnormal gastric mucosa. bx= mild chronic inactive gastritis (oxyntic mucosa), squamous mucosa with epithelial changes consistent with reflux related injury.     Social History:  The patient  reports that he has been smoking Cigarettes.  He has a 30.00 pack-year smoking history. He has never used smokeless tobacco. He reports that he does not drink alcohol or use drugs.   Family History:  The patient's family history includes Heart attack in his mother; Stroke (age of onset: 14) in his father.    ROS:  Please see the history of present illness. All  other systems are reviewed and  Negative to the above problem except as noted.    PHYSICAL EXAM: VS:  BP 140/80   Pulse 89   Ht 5\' 10"  (1.778 m)   Wt 150 lb 8 oz (68.3 kg)   SpO2 96%   BMI 21.59 kg/m   GEN: Well nourished, well developed, in no acute distress  HEENT: normal  Neck: no JVD, bilateral carotid bruits, or masses Cardiac: RRR; no murmurs, rubs, or gallops,no edema  Respiratory:  Rhonchi bilaterally   GI: soft, nontender, nondistended, + BS  No hepatomegaly  MS: no deformity Moving all extremities   Skin: warm and dry, no rash Neuro:  Strength and sensation are intact Psych: euthymic mood, full affect   EKG:  EKG is ordered today.  SR 89 bpm    Lipid Panel No results found for:  CHOL, TRIG, HDL, CHOLHDL, VLDL, LDLCALC, LDLDIRECT    Wt Readings from Last 3 Encounters:  03/06/17 150 lb 8 oz (68.3 kg)  03/01/17 149 lb (67.6 kg)  01/18/17 148 lb (67.1 kg)      ASSESSMENT AND PLAN: 1  Preop risk assessment  Pt being evaluated for CEA  No known dz but probably has  On questioning he can walk up driveway pushing empty garbage can without problem  No SOB or CP  I would recomm echo to evla LVEF   If normal then OK to proceed with surgery    2 CV dz  On ASA  Needs to be on statin  WIll check lipdis today  3  Tob  Counselled on cessation    4  HTN  Fair BP control  Wuld not change with CV dz    Current medicines are reviewed at length with the patient today.  The patient does not have concerns regarding medicines.  Signed, Dorris Carnes, MD  03/06/2017 3:08 PM    [facility] [place], [place], [place]  [address] Phone: [phone]; Fax: [phone]

## 2017-03-06 NOTE — Patient Instructions (Signed)
Your physician recommends that you continue on your current medications as directed. Please refer to the Current Medication list given to you today.  Your physician recommends that you return for lab work today ([place])  Your physician has requested that you have an echocardiogram. Echocardiography is a painless test that uses sound waves to create images of your heart. It provides your doctor with information about the size and shape of your heart and how well your heart's chambers and valves are working. This procedure takes approximately one hour. There are no restrictions for this procedure.  Follow up with your physician will depend on test results.

## 2017-03-07 LAB — LIPID PANEL
CHOLESTEROL TOTAL: 164 mg/dL (ref 100–199)
Chol/HDL Ratio: 4.4 ratio (ref 0.0–5.0)
HDL: 37 mg/dL — ABNORMAL LOW (ref >39)
LDL CALC: 99 mg/dL (ref 0–99)
TRIGLYCERIDES: 140 mg/dL (ref 0–149)
VLDL CHOLESTEROL CAL: 28 mg/dL (ref 5–40)

## 2017-03-08 ENCOUNTER — Encounter: Payer: Self-pay | Admitting: Vascular Surgery

## 2017-03-08 ENCOUNTER — Ambulatory Visit (HOSPITAL_COMMUNITY)
Admission: RE | Admit: 2017-03-08 | Discharge: 2017-03-08 | Disposition: A | Discharge status: Home / Self Care | Payer: Medicare Other | Source: Ambulatory Visit | Attending: Internal Medicine | Admitting: Internal Medicine

## 2017-03-08 DIAGNOSIS — Z720 Tobacco use: Secondary | ICD-10-CM | POA: Diagnosis not present

## 2017-03-08 DIAGNOSIS — I77810 Thoracic aortic ectasia: Secondary | ICD-10-CM | POA: Diagnosis not present

## 2017-03-08 DIAGNOSIS — I082 Rheumatic disorders of both aortic and tricuspid valves: Secondary | ICD-10-CM | POA: Diagnosis not present

## 2017-03-08 DIAGNOSIS — I779 Disorder of arteries and arterioles, unspecified: Secondary | ICD-10-CM

## 2017-03-08 DIAGNOSIS — I119 Hypertensive heart disease without heart failure: Secondary | ICD-10-CM | POA: Insufficient documentation

## 2017-03-08 DIAGNOSIS — K219 Gastro-esophageal reflux disease without esophagitis: Secondary | ICD-10-CM | POA: Diagnosis not present

## 2017-03-08 DIAGNOSIS — I739 Peripheral vascular disease, unspecified: Secondary | ICD-10-CM

## 2017-03-08 DIAGNOSIS — Z01810 Encounter for preprocedural cardiovascular examination: Secondary | ICD-10-CM | POA: Diagnosis not present

## 2017-03-08 LAB — ECHOCARDIOGRAM COMPLETE
AVLVOTPG: 4 mmHg
CHL CUP STROKE VOLUME: 42 mL
E decel time: 324 msec
EERAT: 13.11
FS: 33 % (ref 28–44)
IVS/LV PW RATIO, ED: 1.29
LA ID, A-P, ES: 34 mm
LA diam index: 1.85 cm/m2
LAVOLA4C: 28.9 mL
LDCA: 2.84 cm2
LEFT ATRIUM END SYS DIAM: 34 mm
LV PW d: 12 mm — AB (ref 0.6–1.1)
LV TDI E'MEDIAL: 6.74
LV sys vol: 19 mL — AB
LVDIAVOL: 61 mL — AB (ref 62–150)
LVDIAVOLIN: 33 mL/m2
LVEEAVG: 13.11
LVEEMED: 13.11
LVELAT: 6.31 cm/s
LVOT SV: 75 mL
LVOT VTI: 26.4 cm
LVOTD: 19 mm
LVOTPV: 102 cm/s
LVSYSVOLIN: 11 mL/m2
MV Dec: 324
MV pk A vel: 113 m/s
MVPG: 3 mmHg
MVPKEVEL: 82.7 m/s
RV TAPSE: 20.8 mm
Simpson's disk: 68
TDI e' lateral: 6.31

## 2017-03-08 NOTE — Progress Notes (Signed)
*  PRELIMINARY RESULTS* Echocardiogram 2D Echocardiogram has been performed.  Samuel Germany 03/08/2017, 4:18 PM

## 2017-03-09 ENCOUNTER — Telehealth: Payer: Self-pay | Admitting: Internal Medicine

## 2017-03-09 DIAGNOSIS — I739 Peripheral vascular disease, unspecified: Principal | ICD-10-CM

## 2017-03-09 DIAGNOSIS — I779 Disorder of arteries and arterioles, unspecified: Secondary | ICD-10-CM

## 2017-03-09 MED ORDER — ATORVASTATIN CALCIUM 40 MG PO TABS: 40.0000 mg | ORAL_TABLET | Freq: Every day | ORAL | 11 refills | Status: DC

## 2017-03-09 NOTE — Telephone Encounter (Signed)
New Message  Pt call for lab results. Please call back to discuss

## 2017-03-09 NOTE — Telephone Encounter (Signed)
Pt has been notified of echo results and lab results by phone with verbal understanding. Pt is agreeable to start Atorvastatin 40 mg daily, Rx sent to [facility]. Pt would like to have his FLP/LFT done in [place] [facility] on 05/10/17. Pt thanked me for my call and help today.

## 2017-03-14 NOTE — Progress Notes (Signed)
Established Carotid Patient   History of Present Illness   Peter [name] is a 83 y.o. (1932-11-30) male who presents with chief complaint: none.  Pt has been cleared to L CEA by cardiology, Dr. Harrington [name].  I offered the patient a L CEA this week, but he refused as he wants his daughter to be present prior to making any decision.  Previous carotid studies demonstrated: RICA 46-50% stenosis, LICA 35-46% stenosis.  Patient has no history of TIA or stroke symptom.  The patient has never had amaurosis fugax or monocular blindness.  The patient has never had facial drooping or hemiplegia.  The patient has never had receptive or expressive aphasia.     The patient's PMH, PSH, SH, and FamHx are unchanged from 03/01/17.  Current Outpatient Prescriptions  Medication Sig Dispense Refill  . ALPRAZolam (XANAX) 0.5 MG tablet Take 0.5 mg by mouth at bedtime.     Marland Kitchen amLODipine (NORVASC) 5 MG tablet Take 5 mg by mouth daily.     Marland Kitchen aspirin EC 81 MG tablet Take 81 mg by mouth 2 (two) times a week.     Marland Kitchen atorvastatin (LIPITOR) 40 MG tablet Take 1 tablet (40 mg total) by mouth daily. 30 tablet 11  . ENULOSE 10 GM/15ML SOLN TAKE TWO TABLESPOONFULS AT BEDTIME AS NEEDED. 946 mL 0  . finasteride (PROSCAR) 5 MG tablet Take 5 mg by mouth daily with lunch.     Marland Kitchen HYDROcodone-acetaminophen (NORCO/VICODIN) 5-325 MG per tablet Take 1 tablet by mouth daily as needed for moderate pain.    Marland Kitchen lisinopril (PRINIVIL,ZESTRIL) 40 MG tablet Take 40 mg by mouth daily.     . mirtazapine (REMERON) 15 MG tablet TAKE (1) TABLET BY MOUTH AT BEDTIME. 30 tablet 3  . pantoprazole (PROTONIX) 40 MG tablet TAKE (1) TABLET BY MOUTH ONCE DAILY. 30 tablet 1   No current facility-administered medications for this visit.    Facility-Administered Medications Ordered in Other Visits  Medication Dose Route Frequency Provider Last Rate Last Dose  . shugarcaine ophthalmic solution    PRN Peter Branch, MD        On ROS today: no CVA or TIA, no chest  pain   Physical Examination   Vitals:   03/17/17 1114 03/17/17 1115  BP: (!) 162/71 (!) 158/78  Pulse: 91   SpO2: 99%   Weight: 151 lb 12.8 oz (68.9 kg)   Height: 5\' 10"  (1.778 m)    Body mass index is 21.78 kg/m. General Alert, O x 3, WD, Elderly  Head [place]/AT,    Ear/Nose/ Throat Hearing grossly intact, nares without erythema or drainage, oropharynx without Erythema or Exudate, Mallampati score: 3,   Eyes PERRLA, EOMI,    Neck Supple, mid-line trachea,    Pulmonary Sym exp, good B air movt, CTA B  Cardiac RRR, Nl S1, S2, no Murmurs, No rubs, No S3,S4  Vascular Vessel Right Left  Radial Palpable Palpable  Brachial Palpable Palpable  Carotid Palpable, Bruit present Palpable, Bruit present  Aorta Not palpable N/A  Femoral Palpable Palpable  Popliteal Not palpable Not palpable  PT Not palpable Not palpable  DP Faintly palpable Faintly palpable    Gastro- intestinal soft, non-distended, non-tender to palpation, No guarding or rebound, no HSM, no masses, no CVAT B, No palpable prominent aortic pulse,    Musculo- skeletal M/S 5/5 throughout  , Extremities without ischemic changes  , No edema present, No visible varicosities , No Lipodermatosclerosis present  Neurologic Cranial nerves 2-12  intact , Pain and light touch intact in extremities , Motor exam as listed above  Psychiatric Judgement intact, Mood & affect appropriate for pt's clinical situation  Dermatologic See M/S exam for extremity exam, No rashes otherwise noted  Lymphatic  Palpable lymph nodes: None     Medical Decision Making   Peter [name] is a 83 y.o. male who presents with: asx R ICA stenosis 70-79 (high end)%, asx L ICA stenosis >80% (near occluded)   Based on the patient's vascular studies and examination, I have offered the patient: L CEA. I discussed with the patient the risks, benefits, and alternatives to carotid endarterectomy.   The patient is not a candidate for carotid artery stenting at this  time as acceptable cardiac risk and increased stroke rate in the elderly patient population with CAS. The patient is aware that the risks of carotid endarterectomy include but are not limited to: bleeding, infection, stroke, myocardial infarction, death, cranial nerve injuries both temporary and permanent, neck hematoma, possible airway compromise, labile blood pressure post-operatively, cerebral hyperperfusion syndrome, and possible need for additional interventions in the future.  The patient is aware of the risks and agrees to proceed forward with the procedure.  Pt will likely need R CEA in a staged fashion.  I discussed in depth with the patient the nature of atherosclerosis, and emphasized the importance of maximal medical management including strict control of blood pressure, blood glucose, and lipid levels, antiplatelet agents, obtaining regular exercise, and cessation of smoking.    The patient is aware that without maximal medical management the underlying atherosclerotic disease process will progress, limiting the benefit of any interventions.  The patient is currently not on a statin.  Needs lipid panel done prior to starting statin.   The patient is currently on an anti-platelet: ASA.  Thank you for allowing [place] to participate in this patient's care.   Peter Barthel, MD, FACS [facility] Office: [phone] Pager: [phone]

## 2017-03-17 ENCOUNTER — Encounter: Payer: Self-pay | Admitting: Vascular Surgery

## 2017-03-17 ENCOUNTER — Ambulatory Visit (INDEPENDENT_AMBULATORY_CARE_PROVIDER_SITE_OTHER): Payer: Medicare Other | Admitting: Vascular Surgery

## 2017-03-17 ENCOUNTER — Other Ambulatory Visit: Payer: Self-pay

## 2017-03-17 ENCOUNTER — Encounter (HOSPITAL_COMMUNITY): Payer: Self-pay | Admitting: *Deleted

## 2017-03-17 VITALS — BP 158/78 | HR 91 | Ht 70.0 in | Wt 151.8 lb

## 2017-03-17 DIAGNOSIS — I779 Disorder of arteries and arterioles, unspecified: Secondary | ICD-10-CM | POA: Diagnosis not present

## 2017-03-17 DIAGNOSIS — I739 Peripheral vascular disease, unspecified: Principal | ICD-10-CM

## 2017-03-17 NOTE — Progress Notes (Signed)
Anesthesia Chart Review:  Pt is a same day work up.   Pt is an 83 year old male scheduled for L CEA on 03/20/2017 with [name], MD  - PCP is Redmond School, MD - Saw cardiologist Dorris Carnes, MD for pre-op eval.  Echo ordered, results below. Pt cleared for surgery.   PMH includes:  HTN, GERD.  Current smoker. BMI 22  Medications include: Amlodipine, ASA 81 mg, Lipitor, lisinopril, Protonix  Labs will be obtained DOS  EKG 03/06/17: NSR. Possible LA enlargement  Echo 03/08/17: - Left ventricle: The cavity size was normal. Wall thickness was increased in a pattern of moderate LVH. Systolic function was normal. The estimated ejection fraction was in the range of 60% to 65%. Wall motion was normal; there were no regional wall motion abnormalities. Doppler parameters are consistent with abnormal left ventricular relaxation (grade 1 diastolic dysfunction). - Aortic valve: Moderately calcified annulus. A functionally bicuspid morphology cannot be excluded. - Aortic root: The aortic root was mildly ectatic. - Mitral valve: Calcified annulus. There was trivial regurgitation. - Right atrium: Central venous pressure (est): 3 mm Hg. - Atrial septum: There was increased thickness of the septum, consistent with lipomatous hypertrophy. - Tricuspid valve: There was physiologic regurgitation. - Pulmonary arteries: Systolic pressure could not be accurately estimated. - Pericardium, extracardiac: There was no pericardial effusion. - Impressions: Moderate LVH with LVEF 60-65% and grade 1 diastolic dysfunction. Mildly calcified mitral annulus with trivial mitral regurgitation. Mildly ectatic aortic root. Moderately calcified aortic valve, cannot exclude functionally bicuspid structure but leaflets not well seen. Lipomatous hypertrophy of the interatrial septum.  Carotid duplex 03/01/17:  - R peak systolic velocities consistent with 80-99% stenosis; however, end-diastolic velocities are consistent with 60-79%  stenosis. - L ICA velocities consistent with 80-99% stenosis  If labs acceptable DOS, I anticipate patient can proceed as scheduled.  Willeen Cass, FNP-BC [facility]/Anesthesiology Phone: [phone] 03/17/2017 3:51 PM

## 2017-03-17 NOTE — Progress Notes (Addendum)
Pt SDW-Pre-op call completed by both pt and daughter [name] with pt verbal consent. Pt denies SOB and chest pain. Pt under the care of Dr. Leonard Downing, Cardiology. Daughter denies that pt had a stress test and cardiac cath. Daughter denies pt had a chest x ray within the last year. Daughter made aware to have pt stop taking vitamins, fish oil and herbal medications. Do not take any NSAIDs ie: Ibuprofen, Advil, Naproxen (Aleve), Motrin, BC and Goody Powder.Daughter verbalized understanding of all pre-op instructions. Anesthesia asked to review pt history and clearance note.

## 2017-03-20 ENCOUNTER — Inpatient Hospital Stay (HOSPITAL_COMMUNITY): Payer: Medicare Other | Admitting: Emergency Medicine

## 2017-03-20 ENCOUNTER — Inpatient Hospital Stay (HOSPITAL_COMMUNITY)
Admission: RE | Admit: 2017-03-20 | Discharge: 2017-03-21 | DRG: 039 | Disposition: A | Discharge status: Home / Self Care | Payer: Medicare Other | Source: Ambulatory Visit | Attending: Vascular Surgery | Admitting: Vascular Surgery

## 2017-03-20 ENCOUNTER — Encounter (HOSPITAL_COMMUNITY): Payer: Self-pay | Admitting: Urology

## 2017-03-20 ENCOUNTER — Encounter (HOSPITAL_COMMUNITY)
Admission: RE | Disposition: A | Discharge status: Home / Self Care | Payer: Self-pay | Source: Ambulatory Visit | Attending: Vascular Surgery

## 2017-03-20 DIAGNOSIS — F329 Major depressive disorder, single episode, unspecified: Secondary | ICD-10-CM | POA: Diagnosis present

## 2017-03-20 DIAGNOSIS — N400 Enlarged prostate without lower urinary tract symptoms: Secondary | ICD-10-CM | POA: Diagnosis not present

## 2017-03-20 DIAGNOSIS — I6523 Occlusion and stenosis of bilateral carotid arteries: Principal | ICD-10-CM | POA: Diagnosis present

## 2017-03-20 DIAGNOSIS — G510 Bell's palsy: Secondary | ICD-10-CM | POA: Diagnosis not present

## 2017-03-20 DIAGNOSIS — Z7982 Long term (current) use of aspirin: Secondary | ICD-10-CM

## 2017-03-20 DIAGNOSIS — I6522 Occlusion and stenosis of left carotid artery: Secondary | ICD-10-CM | POA: Diagnosis not present

## 2017-03-20 DIAGNOSIS — K219 Gastro-esophageal reflux disease without esophagitis: Secondary | ICD-10-CM | POA: Diagnosis not present

## 2017-03-20 DIAGNOSIS — I10 Essential (primary) hypertension: Secondary | ICD-10-CM | POA: Diagnosis present

## 2017-03-20 DIAGNOSIS — I6529 Occlusion and stenosis of unspecified carotid artery: Secondary | ICD-10-CM | POA: Diagnosis present

## 2017-03-20 DIAGNOSIS — K449 Diaphragmatic hernia without obstruction or gangrene: Secondary | ICD-10-CM | POA: Diagnosis not present

## 2017-03-20 DIAGNOSIS — M1990 Unspecified osteoarthritis, unspecified site: Secondary | ICD-10-CM | POA: Diagnosis present

## 2017-03-20 DIAGNOSIS — R1314 Dysphagia, pharyngoesophageal phase: Secondary | ICD-10-CM | POA: Diagnosis not present

## 2017-03-20 DIAGNOSIS — G8929 Other chronic pain: Secondary | ICD-10-CM | POA: Diagnosis not present

## 2017-03-20 DIAGNOSIS — K222 Esophageal obstruction: Secondary | ICD-10-CM | POA: Diagnosis not present

## 2017-03-20 DIAGNOSIS — F419 Anxiety disorder, unspecified: Secondary | ICD-10-CM | POA: Diagnosis present

## 2017-03-20 HISTORY — PX: PATCH ANGIOPLASTY: SHX6230

## 2017-03-20 HISTORY — DX: Other complications of anesthesia, initial encounter: T88.59XA

## 2017-03-20 HISTORY — DX: Occlusion and stenosis of left carotid artery: I65.22

## 2017-03-20 HISTORY — PX: CAROTID ENDARTERECTOMY: SUR193

## 2017-03-20 HISTORY — DX: Presence of spectacles and contact lenses: Z97.3

## 2017-03-20 HISTORY — DX: Presence of dental prosthetic device (complete) (partial): Z97.2

## 2017-03-20 HISTORY — DX: Adverse effect of unspecified anesthetic, initial encounter: T41.45XA

## 2017-03-20 HISTORY — PX: ENDARTERECTOMY: SHX5162

## 2017-03-20 LAB — COMPREHENSIVE METABOLIC PANEL
ALK PHOS: 104 U/L (ref 38–126)
ALT: 15 U/L — AB (ref 17–63)
ANION GAP: 7 (ref 5–15)
AST: 21 U/L (ref 15–41)
Albumin: 3.5 g/dL (ref 3.5–5.0)
BUN: <5 mg/dL — ABNORMAL LOW (ref 6–20)
CALCIUM: 9.2 mg/dL (ref 8.9–10.3)
CO2: 27 mmol/L (ref 22–32)
CREATININE: 0.88 mg/dL (ref 0.61–1.24)
Chloride: 102 mmol/L (ref 101–111)
Glucose, Bld: 109 mg/dL — ABNORMAL HIGH (ref 65–99)
Potassium: 4.2 mmol/L (ref 3.5–5.1)
Sodium: 136 mmol/L (ref 135–145)
TOTAL PROTEIN: 6.6 g/dL (ref 6.5–8.1)
Total Bilirubin: 0.5 mg/dL (ref 0.3–1.2)

## 2017-03-20 LAB — CBC
HCT: 37.1 % — ABNORMAL LOW (ref 39.0–52.0)
HEMATOCRIT: 40.8 % (ref 39.0–52.0)
HEMOGLOBIN: 13.6 g/dL (ref 13.0–17.0)
HEMOGLOBIN: 12.1 g/dL — AB (ref 13.0–17.0)
MCH: 30.7 pg (ref 26.0–34.0)
MCH: 30.3 pg (ref 26.0–34.0)
MCHC: 33.3 g/dL (ref 30.0–36.0)
MCHC: 32.6 g/dL (ref 30.0–36.0)
MCV: 92.1 fL (ref 78.0–100.0)
MCV: 93 fL (ref 78.0–100.0)
Platelets: 318 10*3/uL (ref 150–400)
Platelets: 304 10*3/uL (ref 150–400)
RBC: 4.43 MIL/uL (ref 4.22–5.81)
RBC: 3.99 MIL/uL — AB (ref 4.22–5.81)
RDW: 13.1 % (ref 11.5–15.5)
RDW: 12.9 % (ref 11.5–15.5)
WBC: 12 10*3/uL — ABNORMAL HIGH (ref 4.0–10.5)
WBC: 15.4 10*3/uL — AB (ref 4.0–10.5)

## 2017-03-20 LAB — SURGICAL PCR SCREEN
MRSA, PCR: NEGATIVE
Staphylococcus aureus: NEGATIVE

## 2017-03-20 LAB — TYPE AND SCREEN
ABO/RH(D): O POS
Antibody Screen: NEGATIVE

## 2017-03-20 LAB — CREATININE, SERUM
Creatinine, Ser: 1.04 mg/dL (ref 0.61–1.24)
GFR calc Af Amer: >60 mL/min (ref >60)
GFR calc non Af Amer: >60 mL/min (ref >60)

## 2017-03-20 LAB — PROTIME-INR
INR: 1.01
Prothrombin Time: 13.2 seconds (ref 11.4–15.2)

## 2017-03-20 LAB — ABO/RH: ABO/RH(D): O POS

## 2017-03-20 LAB — APTT: aPTT: 34 seconds (ref 24–36)

## 2017-03-20 SURGERY — ENDARTERECTOMY, CAROTID
Anesthesia: General | Site: Neck | Laterality: Left

## 2017-03-20 MED ORDER — DEXAMETHASONE SODIUM PHOSPHATE 10 MG/ML IJ SOLN
INTRAMUSCULAR | Status: DC | PRN
  Administered 2017-03-20: 10 mg via INTRAVENOUS

## 2017-03-20 MED ORDER — PROPOFOL 10 MG/ML IV BOLUS
INTRAVENOUS | Status: AC
  Filled 2017-03-20: qty 20

## 2017-03-20 MED ORDER — HEPARIN SODIUM (PORCINE) 1000 UNIT/ML IJ SOLN
INTRAMUSCULAR | Status: DC | PRN
  Administered 2017-03-20: 2000 [IU] via INTRAVENOUS
  Administered 2017-03-20: 7000 [IU] via INTRAVENOUS

## 2017-03-20 MED ORDER — SODIUM CHLORIDE 0.9 % IV BOLUS (SEPSIS)
500.0000 mL | Freq: Once | INTRAVENOUS | Status: AC
  Administered 2017-03-20: 500 mL via INTRAVENOUS

## 2017-03-20 MED ORDER — DEXAMETHASONE SODIUM PHOSPHATE 10 MG/ML IJ SOLN
INTRAMUSCULAR | Status: AC
  Filled 2017-03-20: qty 1

## 2017-03-20 MED ORDER — ARTIFICIAL TEARS OPHTHALMIC OINT
TOPICAL_OINTMENT | OPHTHALMIC | Status: AC
  Filled 2017-03-20: qty 3.5

## 2017-03-20 MED ORDER — FENTANYL CITRATE (PF) 250 MCG/5ML IJ SOLN
INTRAMUSCULAR | Status: AC
  Filled 2017-03-20: qty 5

## 2017-03-20 MED ORDER — LISINOPRIL 40 MG PO TABS
40.0000 mg | ORAL_TABLET | Freq: Every day | ORAL | Status: DC
  Administered 2017-03-20 – 2017-03-21 (×2): 40 mg via ORAL
  Filled 2017-03-20 (×2): qty 1

## 2017-03-20 MED ORDER — POTASSIUM CHLORIDE CRYS ER 20 MEQ PO TBCR
20.0000 meq | EXTENDED_RELEASE_TABLET | Freq: Every day | ORAL | Status: DC | PRN
Start: 2017-03-20 — End: 2017-03-21

## 2017-03-20 MED ORDER — PHENOL 1.4 % MT LIQD: 1.0000 | OROMUCOSAL | Status: DC | PRN

## 2017-03-20 MED ORDER — LACTATED RINGERS IV SOLN
INTRAVENOUS | Status: DC | PRN
  Administered 2017-03-20: 07:00:00 via INTRAVENOUS

## 2017-03-20 MED ORDER — HYDROMORPHONE HCL 1 MG/ML IJ SOLN: 0.5000 mg | INTRAMUSCULAR | Status: DC | PRN

## 2017-03-20 MED ORDER — FENTANYL CITRATE (PF) 100 MCG/2ML IJ SOLN
INTRAMUSCULAR | Status: AC
  Administered 2017-03-20: 50 ug via INTRAVENOUS
  Filled 2017-03-20: qty 2

## 2017-03-20 MED ORDER — CHLORHEXIDINE GLUCONATE 4 % EX LIQD: 60.0000 mL | Freq: Once | CUTANEOUS | Status: DC

## 2017-03-20 MED ORDER — VANCOMYCIN HCL IN DEXTROSE 1-5 GM/200ML-% IV SOLN
INTRAVENOUS | Status: AC
  Filled 2017-03-20: qty 200

## 2017-03-20 MED ORDER — ASPIRIN EC 81 MG PO TBEC
81.0000 mg | DELAYED_RELEASE_TABLET | Freq: Every day | ORAL | Status: DC
  Administered 2017-03-21: 81 mg via ORAL
  Filled 2017-03-20 (×2): qty 1

## 2017-03-20 MED ORDER — HEMOSTATIC AGENTS (NO CHARGE) OPTIME
TOPICAL | Status: DC | PRN
  Administered 2017-03-20: 1 via TOPICAL

## 2017-03-20 MED ORDER — VANCOMYCIN HCL IN DEXTROSE 1-5 GM/200ML-% IV SOLN
1000.0000 mg | Freq: Two times a day (BID) | INTRAVENOUS | Status: AC
  Administered 2017-03-20 – 2017-03-21 (×2): 1000 mg via INTRAVENOUS
  Filled 2017-03-20 (×2): qty 200

## 2017-03-20 MED ORDER — LACTATED RINGERS IV SOLN
INTRAVENOUS | Status: DC | PRN
Start: 2017-03-20 — End: 2017-03-20
  Administered 2017-03-20: 07:00:00 via INTRAVENOUS

## 2017-03-20 MED ORDER — LIDOCAINE HCL (PF) 1 % IJ SOLN
INTRAMUSCULAR | Status: DC | PRN
  Administered 2017-03-20: 30 mL

## 2017-03-20 MED ORDER — DOCUSATE SODIUM 100 MG PO CAPS
100.0000 mg | ORAL_CAPSULE | Freq: Every day | ORAL | Status: DC
  Administered 2017-03-21: 100 mg via ORAL
  Filled 2017-03-20: qty 1

## 2017-03-20 MED ORDER — PHENYLEPHRINE HCL 10 MG/ML IJ SOLN
INTRAVENOUS | Status: DC | PRN
  Administered 2017-03-20: 15 ug/min via INTRAVENOUS

## 2017-03-20 MED ORDER — ONDANSETRON HCL 4 MG/2ML IJ SOLN
INTRAMUSCULAR | Status: DC | PRN
  Administered 2017-03-20: 4 mg via INTRAVENOUS

## 2017-03-20 MED ORDER — ALPRAZOLAM 0.5 MG PO TABS
0.5000 mg | ORAL_TABLET | Freq: Every day | ORAL | Status: DC
  Administered 2017-03-20: 0.5 mg via ORAL
  Filled 2017-03-20: qty 1

## 2017-03-20 MED ORDER — AMLODIPINE BESYLATE 5 MG PO TABS
5.0000 mg | ORAL_TABLET | Freq: Every day | ORAL | Status: DC
  Administered 2017-03-21: 5 mg via ORAL
  Filled 2017-03-20 (×2): qty 1

## 2017-03-20 MED ORDER — ACETAMINOPHEN 160 MG/5ML PO SOLN: 325.0000 mg | ORAL | Status: DC | PRN

## 2017-03-20 MED ORDER — OXYCODONE HCL 5 MG/5ML PO SOLN: 5.0000 mg | Freq: Once | ORAL | Status: DC | PRN

## 2017-03-20 MED ORDER — SODIUM CHLORIDE 0.9 % IV SOLN
INTRAVENOUS | Status: DC | PRN
  Administered 2017-03-20: 09:00:00

## 2017-03-20 MED ORDER — ENSURE ENLIVE PO LIQD: 237.0000 mL | Freq: Two times a day (BID) | ORAL | Status: DC

## 2017-03-20 MED ORDER — SENNOSIDES-DOCUSATE SODIUM 8.6-50 MG PO TABS: 1.0000 | ORAL_TABLET | Freq: Every evening | ORAL | Status: DC | PRN

## 2017-03-20 MED ORDER — LABETALOL HCL 5 MG/ML IV SOLN
INTRAVENOUS | Status: DC | PRN
  Administered 2017-03-20: 5 mg via INTRAVENOUS

## 2017-03-20 MED ORDER — BISACODYL 5 MG PO TBEC: 5.0000 mg | DELAYED_RELEASE_TABLET | Freq: Every day | ORAL | Status: DC | PRN

## 2017-03-20 MED ORDER — ENOXAPARIN SODIUM 40 MG/0.4ML ~~LOC~~ SOLN: 40.0000 mg | SUBCUTANEOUS | Status: DC

## 2017-03-20 MED ORDER — SODIUM CHLORIDE 0.9 % IV SOLN
500.0000 mL | Freq: Once | INTRAVENOUS | Status: AC | PRN
  Administered 2017-03-20: 500 mL via INTRAVENOUS

## 2017-03-20 MED ORDER — 0.9 % SODIUM CHLORIDE (POUR BTL) OPTIME
TOPICAL | Status: DC | PRN
  Administered 2017-03-20 (×3): 1000 mL

## 2017-03-20 MED ORDER — HYDROCODONE-ACETAMINOPHEN 5-325 MG PO TABS: 1.0000 | ORAL_TABLET | ORAL | Status: DC | PRN

## 2017-03-20 MED ORDER — PHENYLEPHRINE HCL 10 MG/ML IJ SOLN
INTRAMUSCULAR | Status: DC | PRN
  Administered 2017-03-20: 80 ug via INTRAVENOUS

## 2017-03-20 MED ORDER — HYDRALAZINE HCL 20 MG/ML IJ SOLN: 5.0000 mg | INTRAMUSCULAR | Status: DC | PRN

## 2017-03-20 MED ORDER — ACETAMINOPHEN 325 MG RE SUPP
325.0000 mg | RECTAL | Status: DC | PRN
  Filled 2017-03-20: qty 2

## 2017-03-20 MED ORDER — METOPROLOL TARTRATE 5 MG/5ML IV SOLN: 2.0000 mg | INTRAVENOUS | Status: DC | PRN

## 2017-03-20 MED ORDER — SUGAMMADEX SODIUM 200 MG/2ML IV SOLN
INTRAVENOUS | Status: DC | PRN
  Administered 2017-03-20: 150 mg via INTRAVENOUS

## 2017-03-20 MED ORDER — PROTAMINE SULFATE 10 MG/ML IV SOLN
INTRAVENOUS | Status: DC | PRN
  Administered 2017-03-20: 10 mg via INTRAVENOUS
  Administered 2017-03-20: 40 mg via INTRAVENOUS

## 2017-03-20 MED ORDER — PROTAMINE SULFATE 10 MG/ML IV SOLN
INTRAVENOUS | Status: AC
  Filled 2017-03-20: qty 5

## 2017-03-20 MED ORDER — DEXTRAN 40 IN SALINE 10-0.9 % IV SOLN
INTRAVENOUS | Status: AC | PRN
  Administered 2017-03-20: 500 mL

## 2017-03-20 MED ORDER — OXYCODONE HCL 5 MG PO TABS: 5.0000 mg | ORAL_TABLET | Freq: Once | ORAL | Status: DC | PRN

## 2017-03-20 MED ORDER — LIDOCAINE HCL (PF) 1 % IJ SOLN
INTRAMUSCULAR | Status: AC
  Filled 2017-03-20: qty 30

## 2017-03-20 MED ORDER — ONDANSETRON HCL 4 MG/2ML IJ SOLN: 4.0000 mg | Freq: Four times a day (QID) | INTRAMUSCULAR | Status: DC | PRN

## 2017-03-20 MED ORDER — ONDANSETRON HCL 4 MG/2ML IJ SOLN
INTRAMUSCULAR | Status: AC
  Filled 2017-03-20: qty 2

## 2017-03-20 MED ORDER — FINASTERIDE 5 MG PO TABS
5.0000 mg | ORAL_TABLET | Freq: Every day | ORAL | Status: DC
  Administered 2017-03-20: 5 mg via ORAL
  Filled 2017-03-20 (×2): qty 1

## 2017-03-20 MED ORDER — FENTANYL CITRATE (PF) 100 MCG/2ML IJ SOLN
INTRAMUSCULAR | Status: DC | PRN
  Administered 2017-03-20 (×2): 50 ug via INTRAVENOUS

## 2017-03-20 MED ORDER — LIDOCAINE 2% (20 MG/ML) 5 ML SYRINGE
INTRAMUSCULAR | Status: AC
  Filled 2017-03-20: qty 5

## 2017-03-20 MED ORDER — VANCOMYCIN HCL IN DEXTROSE 1-5 GM/200ML-% IV SOLN
1000.0000 mg | INTRAVENOUS | Status: AC
  Administered 2017-03-20: 1000 mg via INTRAVENOUS

## 2017-03-20 MED ORDER — PANTOPRAZOLE SODIUM 40 MG PO TBEC
40.0000 mg | DELAYED_RELEASE_TABLET | Freq: Every day | ORAL | Status: DC
  Administered 2017-03-21: 40 mg via ORAL
  Filled 2017-03-20 (×2): qty 1

## 2017-03-20 MED ORDER — ALUM & MAG HYDROXIDE-SIMETH 200-200-20 MG/5ML PO SUSP: 15.0000 mL | ORAL | Status: DC | PRN

## 2017-03-20 MED ORDER — ROCURONIUM BROMIDE 10 MG/ML (PF) SYRINGE
PREFILLED_SYRINGE | INTRAVENOUS | Status: AC
  Filled 2017-03-20: qty 5

## 2017-03-20 MED ORDER — MAGNESIUM SULFATE 2 GM/50ML IV SOLN
2.0000 g | Freq: Every day | INTRAVENOUS | Status: DC | PRN
  Filled 2017-03-20: qty 50

## 2017-03-20 MED ORDER — GUAIFENESIN-DM 100-10 MG/5ML PO SYRP: 15.0000 mL | ORAL_SOLUTION | ORAL | Status: DC | PRN

## 2017-03-20 MED ORDER — PROPOFOL 10 MG/ML IV BOLUS
INTRAVENOUS | Status: DC | PRN
  Administered 2017-03-20: 40 mg via INTRAVENOUS
  Administered 2017-03-20: 50 mg via INTRAVENOUS
  Administered 2017-03-20: 20 mg via INTRAVENOUS

## 2017-03-20 MED ORDER — LIDOCAINE HCL (CARDIAC) 20 MG/ML IV SOLN
INTRAVENOUS | Status: DC | PRN
  Administered 2017-03-20: 70 mg via INTRAVENOUS

## 2017-03-20 MED ORDER — HEPARIN SODIUM (PORCINE) 1000 UNIT/ML IJ SOLN
INTRAMUSCULAR | Status: AC
  Filled 2017-03-20: qty 3

## 2017-03-20 MED ORDER — FENTANYL CITRATE (PF) 100 MCG/2ML IJ SOLN
25.0000 ug | INTRAMUSCULAR | Status: DC | PRN
  Administered 2017-03-20: 50 ug via INTRAVENOUS

## 2017-03-20 MED ORDER — DEXTRAN 40 IN SALINE 10-0.9 % IV SOLN
INTRAVENOUS | Status: AC
  Filled 2017-03-20: qty 500

## 2017-03-20 MED ORDER — MIRTAZAPINE 15 MG PO TABS
15.0000 mg | ORAL_TABLET | Freq: Every day | ORAL | Status: DC
  Administered 2017-03-20: 15 mg via ORAL
  Filled 2017-03-20: qty 1

## 2017-03-20 MED ORDER — ACETAMINOPHEN 325 MG PO TABS: 325.0000 mg | ORAL_TABLET | ORAL | Status: DC | PRN

## 2017-03-20 MED ORDER — ATORVASTATIN CALCIUM 40 MG PO TABS
40.0000 mg | ORAL_TABLET | Freq: Every day | ORAL | Status: DC
  Administered 2017-03-20: 40 mg via ORAL
  Filled 2017-03-20: qty 1

## 2017-03-20 MED ORDER — SODIUM CHLORIDE 0.9 % IV SOLN
INTRAVENOUS | Status: DC
  Administered 2017-03-20: 14:00:00 via INTRAVENOUS

## 2017-03-20 MED ORDER — MUPIROCIN 2 % EX OINT
1.0000 "application " | TOPICAL_OINTMENT | Freq: Once | CUTANEOUS | Status: AC
  Administered 2017-03-20: 1 via TOPICAL

## 2017-03-20 MED ORDER — ACETAMINOPHEN 325 MG PO TABS
325.0000 mg | ORAL_TABLET | ORAL | Status: DC | PRN
  Administered 2017-03-20: 650 mg via ORAL
  Filled 2017-03-20: qty 2

## 2017-03-20 MED ORDER — LABETALOL HCL 5 MG/ML IV SOLN: 10.0000 mg | INTRAVENOUS | Status: DC | PRN

## 2017-03-20 MED ORDER — ROCURONIUM BROMIDE 100 MG/10ML IV SOLN
INTRAVENOUS | Status: DC | PRN
  Administered 2017-03-20 (×3): 20 mg via INTRAVENOUS
  Administered 2017-03-20: 60 mg via INTRAVENOUS

## 2017-03-20 MED ORDER — MUPIROCIN 2 % EX OINT
TOPICAL_OINTMENT | CUTANEOUS | Status: AC
  Administered 2017-03-20: 1 via TOPICAL
  Filled 2017-03-20: qty 22

## 2017-03-20 MED ORDER — PHENYLEPHRINE 40 MCG/ML (10ML) SYRINGE FOR IV PUSH (FOR BLOOD PRESSURE SUPPORT)
PREFILLED_SYRINGE | INTRAVENOUS | Status: AC
  Filled 2017-03-20: qty 10

## 2017-03-20 MED ORDER — SODIUM CHLORIDE 0.9 % IV SOLN
INTRAVENOUS | Status: DC
  Administered 2017-03-20: 13:00:00 via INTRAVENOUS

## 2017-03-20 MED ORDER — SODIUM CHLORIDE 0.9 % IV SOLN
0.0125 ug/kg/min | INTRAVENOUS | Status: AC
  Administered 2017-03-20: .2 ug/kg/min via INTRAVENOUS
  Filled 2017-03-20: qty 2000

## 2017-03-20 MED ORDER — LACTULOSE 10 GM/15ML PO SOLN
10.0000 g | Freq: Every evening | ORAL | Status: DC | PRN
  Filled 2017-03-20 (×3): qty 15

## 2017-03-20 SURGICAL SUPPLY — 58 items
ADH SKN CLS APL DERMABOND .7 (GAUZE/BANDAGES/DRESSINGS) ×1
ADPR TBG 2 MALE LL ART (MISCELLANEOUS)
AGENT HMST SPONGE THK3/8 (HEMOSTASIS) ×1
BAG DECANTER FOR FLEXI CONT (MISCELLANEOUS) ×5 IMPLANT
CANISTER SUCT 3000ML PPV (MISCELLANEOUS) ×3 IMPLANT
CATH ROBINSON RED A/P 18FR (CATHETERS) ×3 IMPLANT
CLIP VESOCCLUDE MED 24/CT (CLIP) ×3 IMPLANT
CLIP VESOCCLUDE SM WIDE 24/CT (CLIP) ×3 IMPLANT
COVER PROBE W GEL 5X96 (DRAPES) ×2 IMPLANT
CRADLE DONUT ADULT HEAD (MISCELLANEOUS) ×3 IMPLANT
DERMABOND ADVANCED (GAUZE/BANDAGES/DRESSINGS) ×2
DERMABOND ADVANCED .7 DNX12 (GAUZE/BANDAGES/DRESSINGS) ×1 IMPLANT
ELECT REM PT RETURN 9FT ADLT (ELECTROSURGICAL) ×3
ELECTRODE REM PT RTRN 9FT ADLT (ELECTROSURGICAL) ×1 IMPLANT
GLOVE BIO SURGEON STRL SZ7 (GLOVE) ×3 IMPLANT
GLOVE BIOGEL PI IND STRL 6.5 (GLOVE) IMPLANT
GLOVE BIOGEL PI IND STRL 7.0 (GLOVE) IMPLANT
GLOVE BIOGEL PI IND STRL 7.5 (GLOVE) ×1 IMPLANT
GLOVE BIOGEL PI INDICATOR 6.5 (GLOVE) ×2
GLOVE BIOGEL PI INDICATOR 7.0 (GLOVE) ×2
GLOVE BIOGEL PI INDICATOR 7.5 (GLOVE) ×2
GLOVE ECLIPSE 6.5 STRL STRAW (GLOVE) ×2 IMPLANT
GLOVE ECLIPSE 7.5 STRL STRAW (GLOVE) ×4 IMPLANT
GOWN STRL REUS W/ TWL LRG LVL3 (GOWN DISPOSABLE) ×3 IMPLANT
GOWN STRL REUS W/ TWL XL LVL3 (GOWN DISPOSABLE) IMPLANT
GOWN STRL REUS W/TWL LRG LVL3 (GOWN DISPOSABLE) ×12
GOWN STRL REUS W/TWL XL LVL3 (GOWN DISPOSABLE) ×6
HEMOSTAT SPONGE AVITENE ULTRA (HEMOSTASIS) ×2 IMPLANT
IV ADAPTER SYR DOUBLE MALE LL (MISCELLANEOUS) IMPLANT
KIT BASIN OR (CUSTOM PROCEDURE TRAY) ×3 IMPLANT
KIT ROOM TURNOVER OR (KITS) ×3 IMPLANT
KIT SHUNT ARGYLE CAROTID ART 6 (VASCULAR PRODUCTS) ×2 IMPLANT
NDL HYPO 25GX1X1/2 BEV (NEEDLE) IMPLANT
NEEDLE HYPO 25GX1X1/2 BEV (NEEDLE) ×3 IMPLANT
NS IRRIG 1000ML POUR BTL (IV SOLUTION) ×9 IMPLANT
PACK CAROTID (CUSTOM PROCEDURE TRAY) ×3 IMPLANT
PAD ARMBOARD 7.5X6 YLW CONV (MISCELLANEOUS) ×6 IMPLANT
PATCH VASC XENOSURE 1CMX6CM (Vascular Products) ×3 IMPLANT
PATCH VASC XENOSURE 1X6 (Vascular Products) ×1 IMPLANT
SET COLLECT BLD 21X3/4 12 PB (MISCELLANEOUS) IMPLANT
SHUNT CAROTID BYPASS 10 (VASCULAR PRODUCTS) IMPLANT
SHUNT CAROTID BYPASS 12FRX15.5 (VASCULAR PRODUCTS) IMPLANT
SUT ETHILON 3 0 PS 1 (SUTURE) IMPLANT
SUT MNCRL AB 4-0 PS2 18 (SUTURE) ×3 IMPLANT
SUT PROLENE 6 0 BV (SUTURE) ×9 IMPLANT
SUT PROLENE 7 0 BV 1 (SUTURE) ×2 IMPLANT
SUT SILK 3 0 (SUTURE) ×3
SUT SILK 3-0 18XBRD TIE 12 (SUTURE) IMPLANT
SUT SILK 4 0 (SUTURE) ×3
SUT SILK 4-0 18XBRD TIE 12 (SUTURE) IMPLANT
SUT VIC AB 3-0 SH 27 (SUTURE) ×3
SUT VIC AB 3-0 SH 27X BRD (SUTURE) ×1 IMPLANT
SYR 20CC LL (SYRINGE) ×3 IMPLANT
SYR CONTROL 10ML LL (SYRINGE) ×2 IMPLANT
SYR TB 1ML LUER SLIP (SYRINGE) IMPLANT
SYSTEM CHEST DRAIN TLS 7FR (DRAIN) IMPLANT
TUBING ART PRESS 48 MALE/FEM (TUBING) IMPLANT
WATER STERILE IRR 1000ML POUR (IV SOLUTION) ×3 IMPLANT

## 2017-03-20 NOTE — Anesthesia Procedure Notes (Signed)
Arterial Line Insertion Start/End9/04/2017 7:10 AM, 03/20/2017 7:30 AM Performed by: Dessa Phi, CRNA  Patient location: Pre-op. Preanesthetic checklist: patient identified, IV checked, site marked, risks and benefits discussed, surgical consent, monitors and equipment checked, pre-op evaluation, timeout performed and anesthesia consent Lidocaine 1% used for infiltration Right, radial was placed Catheter size: 20 Fr Hand hygiene performed  and maximum sterile barriers used   Attempts: 2 Procedure performed without using ultrasound guided technique. Following insertion, dressing applied. Post procedure assessment: normal and unchanged  Patient tolerated the procedure well with no immediate complications. Additional procedure comments: Attempt by srna on left, unable to thread catheter, successful attempt on right by crna.

## 2017-03-20 NOTE — Interval H&P Note (Signed)
History and Physical Interval Note:  03/20/2017 7:08 AM  Peter [name]  has presented today for surgery, with the diagnosis of Left Internal Carotid Artery Stenosis  I65.22  The various methods of treatment have been discussed with the patient and family. After consideration of risks, benefits and other options for treatment, the patient has consented to  Procedure(s): ENDARTERECTOMY CAROTID-LEFT (Left) as a surgical intervention .  The patient's history has been reviewed, patient examined, no change in status, stable for surgery.  I have reviewed the patient's chart and labs.  Questions were answered to the patient's satisfaction.     [name]

## 2017-03-20 NOTE — Care Management Note (Signed)
Case Management Note Marvetta Gibbons RN, BSN Unit 4E-Case Manager [phone]  Patient Details  Name: Peter [name] MRN: [id number] Date of Birth: 17-Oct-1932  Subjective/Objective:  Pt admitted s/p left carotid endarterectomy with bovine patch angioplasty                 Action/Plan: PTA pt lived at home- anticipate return home- CM to follow  Expected Discharge Date:  03/21/17               Expected Discharge Plan:  Home/Self Care  In-House Referral:     Discharge planning Services  CM Consult  Post Acute Care Choice:    Choice offered to:     DME Arranged:    DME Agency:     HH Arranged:    [place] Agency:     Status of Service:  In process, will continue to follow  If discussed at Long Length of Stay Meetings, dates discussed:    Additional Comments:  Dawayne Patricia, RN 03/20/2017, 2:35 PM

## 2017-03-20 NOTE — Op Note (Signed)
OPERATIVE NOTE  PROCEDURE:   1.  left carotid endarterectomy with bovine patch angioplasty 2.  left intraoperative carotid ultrasound  PRE-OPERATIVE DIAGNOSIS: left asymptomatic carotid stenosis near occluded  POST-OPERATIVE DIAGNOSIS: same as above   SURGEON: [name], MD  ASSISTANT(S): Gerri Lins, PAC   ANESTHESIA: general  ESTIMATED BLOOD LOSS: 100 cc  FINDING(S): 1.  Continuous Doppler audible flow signatures are appropriate for each carotid artery. 2.  No evidence of intimal flap visualized on transverse or longitudinal ultrasonography. 3.  Carotid plaque: near occluded, heavily calcified 4.  Vagus nerve: normal position 5.  Hypoglossal nerve: visualized, required mobilization  SPECIMEN(S):  None  INDICATIONS:   Peter [name] is a 83 y.o. male who presents with left asymptomatic carotid stenosis near occluded.  I discussed with the patient the risks, benefits, and alternatives to carotid endarterectomy.  The patient is not a candidate for carotid artery stenting. I discussed the procedural details of carotid endarterectomy with the patient.  The patient is aware that the risks of carotid endarterectomy include but are not limited to: bleeding, infection, stroke, myocardial infarction, death, cranial nerve injuries both temporary and permanent, neck hematoma, possible airway compromise, labile blood pressure post-operatively, cerebral hyperperfusion syndrome, and possible need for additional interventions in the future. The patient is aware of the risks and agrees to proceed forward with the procedure.   DESCRIPTION: After full informed written consent was obtained from the patient, the patient was brought back to the operating room and placed supine upon the operating table.  Prior to induction, the patient received IV antibiotics.  After obtaining adequate anesthesia, the patient was placed into semi-Fowler position with a shoulder roll in place and the patient's  neck slightly hyperextended and rotated away from the surgical site.    The patient was prepped in the standard fashion for a left carotid endarterectomy.  I made an incision anterior to the sternocleidomastoid muscle and dissected down through the subcutaneous tissue.  The platysmas was opened with electrocautery.  Then I dissected down to the internal jugular vein.  This was dissected posteriorly until I obtained visualization of the common carotid artery.  This was dissected out and then an umbilical tape was placed around the common carotid artery and I loosely applied a Rumel tourniquet.  I then dissected in a periadventitial fashion along the common carotid artery up to the bifurcation.  I then identified the external carotid artery and the superior thyroid artery.  A 2-0 silk tie was looped around the superior thyroid artery, and I also dissected out the external carotid artery and placed a vessel loop around it.  In continuing the dissection to the internal carotid artery, I identified the facial vein.  This was ligated and then transected, giving me improved exposure of the internal carotid artery.  In the process of this dissection, the hypoglossal nerve was identified.  I then dissected out the internal carotid artery until I identified an area of soft tissue in the internal carotid artery.  I dissected slightly distal to this area, and placed an umbilical tape around the artery and loosely applied a Rumel tourniquet.    At this point, the patient was given 700 units of Heparin intravenously, which was a therapeutic bolus. An additional 2000 units of Heparin was administered every hour after initial bolus to maintain anticoagulation.  In total, 9000 units of Heparin was administrated to achieve and maintain a therapeutic level of anticoagulation.  After waiting 3 minutes, then I clamped  the internal carotid artery, external carotid artery and then the common carotid artery.  I then made an arteriotomy  in the common carotid artery with a 11 blade, and extended the arteriotomy with a Potts scissor down into the common carotid artery, then I carried the arteriotomy through the bifurcation into the internal carotid artery until I reached an area that was not diseased.  At this point, I took the 10 shunt that previously been prepared and I inserted it into the internal carotid artery.  The Rumel tourniquet was then applied to this end of the shunt.  I unclamped the shunt to verify retrograde blood flow in the internal carotid artery.  This flow was limited.  I then placed the other end of the shunt into the common carotid artery after unclamping the artery.  The Rumel was tightened down around the shunt.  At this point, I verified blood flow in the shunt with a continuous doppler.  At this point, I started the endarterectomy in the common carotid artery with a Technical brewer and carried this dissection down into the common carotid artery circumferentially.  Then I transected the plaque at a segment where it was adherent.  I then carried this dissection up into the external carotid artery.  The plaque was extracted by unclamping the external carotid artery and everting the artery.  The dissection was then carried into the internal carotid artery, extracting the remaining portion of the carotid plaque.  I passed the plaque off the field as a specimen.    I then spent the next 30 minutes removing intimal flaps and loose debris.  Eventually I reached the point where the residual plaque was densely adherent and any further dissection would compromise the integrity of the wall.  After verifying that there was no more loose intimal flaps or debris, I re-interrogated the entirety of this carotid artery.  At this point, I was satisfied that the minimal remaining disease was densely adherent to the wall and wall integrity was intact.  At this point, I then fashioned a bovine pericardial patch for the geometry of this  artery and sewed it in place with two running stitch of 6-0 Prolene, one from each end.  Prior to completing this patch angioplasty, I removed the shunt first from the internal carotid artery, from which there was excellent backbleeding, and clamped it.  Then I removed the shunt from the common carotid artery, from which there was excellent antegrade bleeding, and then clamped it.  At this point, I allowed the external carotid artery to backbleed, which was excellent.  Then I instilled heparinized saline in this patched artery and then completed the patch angioplasty in the usual fashion.    At this point, I first released the clamp on the external carotid artery, then I released it on the common carotid artery.  After waiting a few seconds, I then released it on the internal carotid artery.  I then interrogated this patient's arteries with the continuous Doppler.  The audible waveforms in each artery were consistent with the expected characteristics for each artery.  The Sonosite probe was then sterilely draped and used to interrogate the carotid artery in both longitudinal and transverse views.  At this point, I washed out the wound, and placed Avitene throughout.  I also gave the patient 50 mg of protamine to reverse his anticoagulation.   After waiting a few minutes, I removed the Avitene and washed out the wound.  There was no more active bleeding  in the surgical site.     I then reapproximated the platysma muscle with a running stitch of 3-0 Vicryl.  The skin was then reapproximated with a running subcuticular 4-0 Monocryl stitch.  The skin was then cleaned, dried and Dermabond was used to reinforce the skin closure.    The patient woke without any problems, neurologically intact.    COMPLICATIONS: none  CONDITION: stable   [name], MD, [facility] [place] Office: [phone] Pager: [phone]  03/20/2017, 10:43 AM

## 2017-03-20 NOTE — Anesthesia Procedure Notes (Signed)
Procedure Name: Intubation Date/Time: 03/20/2017 8:09 AM Performed by: Candis Shine Pre-anesthesia Checklist: Patient identified, Emergency Drugs available, Suction available and Patient being monitored Patient Re-evaluated:Patient Re-evaluated prior to induction Oxygen Delivery Method: Circle System Utilized Preoxygenation: Pre-oxygenation with 100% oxygen Induction Type: IV induction Ventilation: Mask ventilation without difficulty and Oral airway inserted - appropriate to patient size Laryngoscope Size: Mac and 4 Grade View: Grade I Tube type: Oral Tube size: 7.5 mm Number of attempts: 1 Airway Equipment and Method: Stylet and Oral airway Placement Confirmation: ETT inserted through vocal cords under direct vision,  positive ETCO2 and breath sounds checked- equal and bilateral Secured at: 22 cm Tube secured with: Tape Dental Injury: Teeth and Oropharynx as per pre-operative assessment

## 2017-03-20 NOTE — H&P (View-Only) (Signed)
Established Carotid Patient   History of Present Illness   Peter [name] is a 83 y.o. (Feb 27, 1933) male who presents with chief complaint: none.  Pt has been cleared to L CEA by cardiology, Dr. Harrington Challenger.  I offered the patient a L CEA this week, but he refused as he wants his daughter to be present prior to making any decision.  Previous carotid studies demonstrated: RICA 58-09% stenosis, LICA 98-33% stenosis.  Patient has no history of TIA or stroke symptom.  The patient has never had amaurosis fugax or monocular blindness.  The patient has never had facial drooping or hemiplegia.  The patient has never had receptive or expressive aphasia.     The patient's PMH, PSH, SH, and FamHx are unchanged from 03/01/17.  Current Outpatient Prescriptions  Medication Sig Dispense Refill  . ALPRAZolam (XANAX) 0.5 MG tablet Take 0.5 mg by mouth at bedtime.     Marland Kitchen amLODipine (NORVASC) 5 MG tablet Take 5 mg by mouth daily.     Marland Kitchen aspirin EC 81 MG tablet Take 81 mg by mouth 2 (two) times a week.     Marland Kitchen atorvastatin (LIPITOR) 40 MG tablet Take 1 tablet (40 mg total) by mouth daily. 30 tablet 11  . ENULOSE 10 GM/15ML SOLN TAKE TWO TABLESPOONFULS AT BEDTIME AS NEEDED. 946 mL 0  . finasteride (PROSCAR) 5 MG tablet Take 5 mg by mouth daily with lunch.     Marland Kitchen HYDROcodone-acetaminophen (NORCO/VICODIN) 5-325 MG per tablet Take 1 tablet by mouth daily as needed for moderate pain.    Marland Kitchen lisinopril (PRINIVIL,ZESTRIL) 40 MG tablet Take 40 mg by mouth daily.     . mirtazapine (REMERON) 15 MG tablet TAKE (1) TABLET BY MOUTH AT BEDTIME. 30 tablet 3  . pantoprazole (PROTONIX) 40 MG tablet TAKE (1) TABLET BY MOUTH ONCE DAILY. 30 tablet 1   No current facility-administered medications for this visit.    Facility-Administered Medications Ordered in Other Visits  Medication Dose Route Frequency Provider Last Rate Last Dose  . shugarcaine ophthalmic solution    PRN Tonny Branch, MD        On ROS today: no CVA or TIA, no chest  pain   Physical Examination   Vitals:   03/17/17 1114 03/17/17 1115  BP: (!) 162/71 (!) 158/78  Pulse: 91   SpO2: 99%   Weight: 151 lb 12.8 oz (68.9 kg)   Height: 5\' 10"  (1.778 m)    Body mass index is 21.78 kg/m. General Alert, O x 3, WD, Elderly  Head [place]/AT,    Ear/Nose/ Throat Hearing grossly intact, nares without erythema or drainage, oropharynx without Erythema or Exudate, Mallampati score: 3,   Eyes PERRLA, EOMI,    Neck Supple, mid-line trachea,    Pulmonary Sym exp, good B air movt, CTA B  Cardiac RRR, Nl S1, S2, no Murmurs, No rubs, No S3,S4  Vascular Vessel Right Left  Radial Palpable Palpable  Brachial Palpable Palpable  Carotid Palpable, Bruit present Palpable, Bruit present  Aorta Not palpable N/A  Femoral Palpable Palpable  Popliteal Not palpable Not palpable  PT Not palpable Not palpable  DP Faintly palpable Faintly palpable    Gastro- intestinal soft, non-distended, non-tender to palpation, No guarding or rebound, no HSM, no masses, no CVAT B, No palpable prominent aortic pulse,    Musculo- skeletal M/S 5/5 throughout  , Extremities without ischemic changes  , No edema present, No visible varicosities , No Lipodermatosclerosis present  Neurologic Cranial nerves 2-12  intact , Pain and light touch intact in extremities , Motor exam as listed above  Psychiatric Judgement intact, Mood & affect appropriate for pt's clinical situation  Dermatologic See M/S exam for extremity exam, No rashes otherwise noted  Lymphatic  Palpable lymph nodes: None     Medical Decision Making   Peter [name] is a 83 y.o. male who presents with: asx R ICA stenosis 70-79 (high end)%, asx L ICA stenosis >80% (near occluded)   Based on the patient's vascular studies and examination, I have offered the patient: L CEA. I discussed with the patient the risks, benefits, and alternatives to carotid endarterectomy.   The patient is not a candidate for carotid artery stenting at this  time as acceptable cardiac risk and increased stroke rate in the elderly patient population with CAS. The patient is aware that the risks of carotid endarterectomy include but are not limited to: bleeding, infection, stroke, myocardial infarction, death, cranial nerve injuries both temporary and permanent, neck hematoma, possible airway compromise, labile blood pressure post-operatively, cerebral hyperperfusion syndrome, and possible need for additional interventions in the future.  The patient is aware of the risks and agrees to proceed forward with the procedure.  Pt will likely need R CEA in a staged fashion.  I discussed in depth with the patient the nature of atherosclerosis, and emphasized the importance of maximal medical management including strict control of blood pressure, blood glucose, and lipid levels, antiplatelet agents, obtaining regular exercise, and cessation of smoking.    The patient is aware that without maximal medical management the underlying atherosclerotic disease process will progress, limiting the benefit of any interventions.  The patient is currently not on a statin.  Needs lipid panel done prior to starting statin.   The patient is currently on an anti-platelet: ASA.  Thank you for allowing [place] to participate in this patient's care.   [name], MD, FACS [facility] [place] Office: [phone] Pager: [phone]

## 2017-03-20 NOTE — Progress Notes (Signed)
  Day of Surgery Note    Subjective:  Says he has a a headache that is nagging and his throat is sore   Vitals:   03/20/17 1317 03/20/17 1352  BP: (!) 105/46 112/60  Pulse: 65   Resp: 16   Temp: 98.2 F (36.8 C) 98.2 F (36.8 C)  SpO2: 96% 96%    Incisions:   Clean and dry without hematoma Extremities:  Moving all extremities equally Cardiac:  regular Lungs:  Non labored Neuro:  In tact; tongue is midline; marginal mandibular neuropraxia   Assessment/Plan:  This is a 83 y.o. male who is s/p  Left carotid endarterectomy  -pt doing well post op and neuro intact.  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  -mild nagging headache-Tylenol -pt with cough-incentive spirometry -most likely discharge home tomorrow   Leontine Locket, PA-C 03/20/2017 4:04 PM [phone]

## 2017-03-20 NOTE — Transfer of Care (Signed)
Immediate Anesthesia Transfer of Care Note  Patient: Peter [name]  Procedure(s) Performed: Procedure(s): ENDARTERECTOMY CAROTID-LEFT (Left) PATCH ANGIOPLASTY (Left)  Patient Location: PACU  Anesthesia Type:General  Level of Consciousness: awake, alert  and oriented  Airway & Oxygen Therapy: Patient Spontanous Breathing and Patient connected to nasal cannula oxygen  Post-op Assessment: Report given to RN and Post -op Vital signs reviewed and stable  Post vital signs: Reviewed and stable  Last Vitals:  Vitals:   03/20/17 0612 03/20/17 1107  BP:  (!) 106/52  Pulse: 75 73  Resp:  13  Temp:  36.7 C  SpO2: 99% 100%    Last Pain:  Vitals:   03/20/17 1107  TempSrc:   PainSc: (P) 0-No pain      Patients Stated Pain Goal: 5 (22/48/25 0037)  Complications: No apparent anesthesia complications

## 2017-03-20 NOTE — Anesthesia Preprocedure Evaluation (Signed)
Anesthesia Evaluation  Patient identified by MRN, date of birth, ID band Patient awake    Reviewed: Allergy & Precautions, NPO status , Patient's Chart, lab work & pertinent test results  History of Anesthesia Complications Negative for: history of anesthetic complications  Airway Mallampati: II  TM Distance: >3 FB Neck ROM: Full    Dental  (+) Edentulous Upper, Poor Dentition, Chipped   Pulmonary neg shortness of breath, neg COPD, neg recent URI, Current Smoker,    + rhonchi        Cardiovascular hypertension, Pt. on medications (-) angina+ Peripheral Vascular Disease  (-) Past MI and (-) CHF  Rhythm:Regular + Systolic murmurs    Neuro/Psych PSYCHIATRIC DISORDERS Anxiety Depression  Neuromuscular disease    GI/Hepatic Neg liver ROS, hiatal hernia, GERD  Medicated,  Endo/Other  negative endocrine ROS  Renal/GU negative Renal ROS     Musculoskeletal  (+) Arthritis ,   Abdominal   Peds  Hematology negative hematology ROS (+)   Anesthesia Other Findings   Reproductive/Obstetrics                             Anesthesia Physical Anesthesia Plan  ASA: III  Anesthesia Plan: General   Post-op Pain Management:    Induction: Intravenous  PONV Risk Score and Plan: 1 and Ondansetron and Dexamethasone  Airway Management Planned: Oral ETT  Additional Equipment: Arterial line  Intra-op Plan:   Post-operative Plan: Extubation in OR  Informed Consent: I have reviewed the patients History and Physical, chart, labs and discussed the procedure including the risks, benefits and alternatives for the proposed anesthesia with the patient or authorized representative who has indicated his/her understanding and acceptance.   Dental advisory given  Plan Discussed with: CRNA and Surgeon  Anesthesia Plan Comments:         Anesthesia Quick Evaluation

## 2017-03-20 NOTE — Progress Notes (Signed)
Patient arrived from PACU on bed. Vitals and assessment completed upon arrival please see flow sheets. Patient was alert and oriented.Call bell was placed within reach and bed set at lowest position. Will continue to monitor.

## 2017-03-21 ENCOUNTER — Telehealth: Payer: Self-pay | Admitting: Vascular Surgery

## 2017-03-21 ENCOUNTER — Encounter (HOSPITAL_COMMUNITY): Payer: Self-pay | Admitting: Vascular Surgery

## 2017-03-21 LAB — CBC
HCT: 33.7 % — ABNORMAL LOW (ref 39.0–52.0)
HEMOGLOBIN: 10.9 g/dL — AB (ref 13.0–17.0)
MCH: 29.9 pg (ref 26.0–34.0)
MCHC: 32.3 g/dL (ref 30.0–36.0)
MCV: 92.6 fL (ref 78.0–100.0)
PLATELETS: 278 10*3/uL (ref 150–400)
RBC: 3.64 MIL/uL — ABNORMAL LOW (ref 4.22–5.81)
RDW: 13 % (ref 11.5–15.5)
WBC: 12.1 10*3/uL — ABNORMAL HIGH (ref 4.0–10.5)

## 2017-03-21 LAB — BASIC METABOLIC PANEL
Anion gap: 6 (ref 5–15)
BUN: 11 mg/dL (ref 6–20)
CALCIUM: 8.7 mg/dL — AB (ref 8.9–10.3)
CO2: 22 mmol/L (ref 22–32)
CREATININE: 0.99 mg/dL (ref 0.61–1.24)
Chloride: 104 mmol/L (ref 101–111)
Glucose, Bld: 126 mg/dL — ABNORMAL HIGH (ref 65–99)
Potassium: 4.3 mmol/L (ref 3.5–5.1)
SODIUM: 132 mmol/L — AB (ref 135–145)

## 2017-03-21 NOTE — Progress Notes (Signed)
[facility] [place]  Subjective  - Doing well over all.   Objective 132/60 78 98.7 F (37.1 C) (Oral) 16 96%  Intake/Output Summary (Last 24 hours) at 03/21/17 0744 Last data filed at 03/21/17 0557  Gross per 24 hour  Intake          3097.08 ml  Output              970 ml  Net          2127.08 ml    Left facial droop No tongue deviation Incision on left neck without hematoma Palpable radial pulses B Heart RRR Lungs non labored breathing   Assessment/Planning: POD # 1 Left CEA  Stable for discharge   Laurence Slate [facility] 03/21/2017 7:44 AM --  Laboratory Lab Results:  Recent Labs  03/20/17 1401 03/21/17 0227  WBC 15.4* 12.1*  HGB 12.1* 10.9*  HCT 37.1* 33.7*  PLT 304 278   BMET  Recent Labs  03/20/17 0624 03/20/17 1401 03/21/17 0227  NA 136  --  132*  K 4.2  --  4.3  CL 102  --  104  CO2 27  --  22  GLUCOSE 109*  --  126*  BUN <5*  --  11  CREATININE 0.88 1.04 0.99  CALCIUM 9.2  --  8.7*    COAG Lab Results  Component Value Date   INR 1.01 03/20/2017   No results found for: PTT

## 2017-03-21 NOTE — Anesthesia Postprocedure Evaluation (Signed)
Anesthesia Post Note  Patient: Peter [name]  Procedure(s) Performed: Procedure(s) (LRB): ENDARTERECTOMY CAROTID-LEFT (Left) PATCH ANGIOPLASTY (Left)     Patient location during evaluation: PACU Anesthesia Type: General Level of consciousness: awake and alert Pain management: pain level controlled Vital Signs Assessment: post-procedure vital signs reviewed and stable Respiratory status: spontaneous breathing, nonlabored ventilation, respiratory function stable and patient connected to nasal cannula oxygen Cardiovascular status: blood pressure returned to baseline and stable Postop Assessment: no signs of nausea or vomiting Anesthetic complications: no    Last Vitals:  Vitals:   03/21/17 0500 03/21/17 0700  BP:  (!) 126/53  Pulse: 78 70  Resp: 16 18  Temp:    SpO2: 96% 99%    Last Pain:  Vitals:   03/21/17 0759  TempSrc:   PainSc: 0-No pain                 Vasilia Dise

## 2017-03-21 NOTE — Telephone Encounter (Signed)
-----   Message from Denman George, RN sent at 03/20/2017  5:43 PM EDT ----- Regarding: Needs to f/u in 2 weeks with Dr. Bridgett Larsson   ----- Message ----- From: Conrad Wayland, MD Sent: 03/20/2017  11:00 AM To: [facility] [address]  [name] [id number] 1932/09/11  PROCEDURE:   1.  left carotid endarterectomy with bovine patch angioplasty 2.  left intraoperative carotid ultrasound  Asst: Gerri Lins, [facility]   Follow-up: 2 weeks

## 2017-03-21 NOTE — Telephone Encounter (Signed)
Sched appt 04/07/17 at 11:45. Spoke to pt's wife.

## 2017-03-21 NOTE — Progress Notes (Signed)
   Daily Progress Note   Assessment/Planning:   POD #1 s/p L CEA   No signs of cerebral hyperperfusion  Neuro intact  L marginal mandibular palsy evident  No problems swallowing.  Vomited once last night: felt due to esophageal stricture  Ok to discharge   Subjective  - 1 Day Post-Op   No complaints, minimal pain   Objective   Vitals:   03/20/17 2100 03/21/17 0000 03/21/17 0400 03/21/17 0500  BP: (!) 121/50  132/60   Pulse: 62  76 78  Resp: (!) 26  17 16   Temp:  98.4 F (36.9 C) 98.7 F (37.1 C)   TempSrc:  Oral Oral   SpO2: 97%  96% 96%  Weight:      Height:         Intake/Output Summary (Last 24 hours) at 03/21/17 0734 Last data filed at 03/21/17 0557  Gross per 24 hour  Intake          3097.08 ml  Output              970 ml  Net          2127.08 ml    PULM  CTAB  CV  RRR  GI  soft, NTND  VASC L neck inc c/d/i, no hematoma  NEURO CN 2-12 intact, sym motor 5/5, midline tongue, L angle of mouth droop    Laboratory   CBC CBC Latest Ref Rng & Units 03/21/2017 03/20/2017 03/20/2017  WBC 4.0 - 10.5 K/uL 12.1(H) 15.4(H) 12.0(H)  Hemoglobin 13.0 - 17.0 g/dL 10.9(L) 12.1(L) 13.6  Hematocrit 39.0 - 52.0 % 33.7(L) 37.1(L) 40.8  Platelets 150 - 400 K/uL 278 304 318    BMET    Component Value Date/Time   NA 132 (L) 03/21/2017 0227   K 4.3 03/21/2017 0227   CL 104 03/21/2017 0227   CO2 22 03/21/2017 0227   GLUCOSE 126 (H) 03/21/2017 0227   BUN 11 03/21/2017 0227   CREATININE 0.99 03/21/2017 0227   CALCIUM 8.7 (L) 03/21/2017 0227   GFRNONAA >60 03/21/2017 0227   GFRAA >60 03/21/2017 0227     [name], MD, FACS [facility] Office: [phone] Pager: [phone]  03/21/2017, 7:34 AM

## 2017-03-21 NOTE — Care Management Note (Signed)
Case Management Note Marvetta Gibbons RN, BSN Unit 4E-Case Manager [phone]  Patient Details  Name: Peter [name] MRN: [id number] Date of Birth: June 03, 1933  Subjective/Objective:  Pt admitted s/p left carotid endarterectomy with bovine patch angioplasty                 Action/Plan: PTA pt lived at home- anticipate return home- CM to follow  Expected Discharge Date:  03/21/17               Expected Discharge Plan:  Home/Self Care  In-House Referral:  NA  Discharge planning Services  CM Consult  Post Acute Care Choice:  NA Choice offered to:  NA  DME Arranged:    DME Agency:     HH Arranged:    [place] Agency:     Status of Service:  Completed, signed off  If discussed at [place] of Stay Meetings, dates discussed:    Discharge Disposition: home/self care   Additional Comments:  03/21/17- 1000- Marvetta Gibbons RN, CM- pt for d/c home today- no CM needs noted for discharge  Dawayne Patricia, RN 03/21/2017, 10:07 AM

## 2017-03-22 NOTE — Discharge Summary (Signed)
[facility] Discharge Summary   Patient ID:  Peter [name] MRN: [id number] DOB/AGE: 02-14-1933 83 y.o.  Admit date: 03/20/2017 Discharge date: 03/21/2017 Date of Surgery: 03/20/2017 Surgeon: Surgeon(s): Conrad , MD  Admission Diagnosis: Left Internal Carotid Artery Stenosis  I65.22  Discharge Diagnoses:  Left Internal Carotid Artery Stenosis  I65.22  Secondary Diagnoses: Past Medical History:  Diagnosis Date  . Anxiety   . Arthritis   . BPH (benign prostatic hyperplasia)   . Chronic pain   . Complication of anesthesia    had an anxiety attack once  . Depression   . GERD (gastroesophageal reflux disease)   . Hiatal hernia    small  . HTN (hypertension)   . Internal carotid artery stenosis, left   . Prostate disease    h/o elevated PSA, neg bx, [facility]  . Sciatica   . Wears glasses   . Wears partial dentures     Procedure(s): ENDARTERECTOMY CAROTID-LEFT PATCH ANGIOPLASTY  Discharged Condition: good  HPI: Peter [name] is a 83 y.o. (1932-10-27) male who presents with chief complaint: none.  Pt has been cleared to L CEA by cardiology, Dr. Harrington Challenger.  I offered the patient a L CEA this week, but he refused as he wants his daughter to be present prior to making any decision.  Previous carotid studies demonstrated: RICA 12-75% stenosis, LICA 17-00% stenosis. Patient has nohistory of TIA or stroke symptom. The patient has neverhad amaurosis fugax or monocular blindness. The patient has neverhad facial drooping or hemiplegia. The patient has neverhad receptive or expressive aphasia.   The patient's PMH, PSH, SH, and FamHx are unchanged from 03/01/17.   Carotid duplex:R ICA stenosis 70-79 (high end)%, asx L ICA stenosis >80% (near occluded)  Hospital Course:  Peter [name] is a 83 y.o. male is S/P  Procedure(s): ENDARTERECTOMY CAROTID-LEFT PATCH ANGIOPLASTY  Left facial droop No tongue deviation Incision on left neck without  hematoma Palpable radial pulses B Heart RRR Lungs non labored breathing   Assessment/Planning: POD # 1 Left CEA  Stable for discharge   Significant Diagnostic Studies: CBC Lab Results  Component Value Date   WBC 12.1 (H) 03/21/2017   HGB 10.9 (L) 03/21/2017   HCT 33.7 (L) 03/21/2017   MCV 92.6 03/21/2017   PLT 278 03/21/2017    BMET    Component Value Date/Time   NA 132 (L) 03/21/2017 0227   K 4.3 03/21/2017 0227   CL 104 03/21/2017 0227   CO2 22 03/21/2017 0227   GLUCOSE 126 (H) 03/21/2017 0227   BUN 11 03/21/2017 0227   CREATININE 0.99 03/21/2017 0227   CALCIUM 8.7 (L) 03/21/2017 0227   GFRNONAA >60 03/21/2017 0227   GFRAA >60 03/21/2017 0227   COAG Lab Results  Component Value Date   INR 1.01 03/20/2017     Disposition:  Discharge to :Home Discharge Instructions    Call MD for:  redness, tenderness, or signs of infection (pain, swelling, bleeding, redness, odor or green/yellow discharge around incision site)    Complete by:  As directed    Call MD for:  redness, tenderness, or signs of infection (pain, swelling, bleeding, redness, odor or green/yellow discharge around incision site)    Complete by:  As directed    Call MD for:  severe or increased pain, loss or decreased feeling  in affected limb(s)    Complete by:  As directed    Call MD for:  severe or increased pain, loss or decreased  feeling  in affected limb(s)    Complete by:  As directed    Call MD for:  temperature >100.5    Complete by:  As directed    Call MD for:  temperature >100.5    Complete by:  As directed    Discharge instructions    Complete by:  As directed    You may shower daily in 24 hours.   Discharge instructions    Complete by:  As directed    You may shower in 24 hours as neded   Driving Restrictions    Complete by:  As directed    No driving for 1 week   Driving Restrictions    Complete by:  As directed    No driving for 1 week   Increase activity slowly     Complete by:  As directed    Walk with assistance use walker or cane as needed   Increase activity slowly    Complete by:  As directed    Walk with assistance use walker or cane as needed   Lifting restrictions    Complete by:  As directed    No lifting for 3-4 weeks   Lifting restrictions    Complete by:  As directed    No heavy lifting for 3 weeks   Resume previous diet    Complete by:  As directed    Resume previous diet    Complete by:  As directed      Allergies as of 03/21/2017      Reactions   Penicillins Rash   Has patient had a PCN reaction causing immediate rash, facial/tongue/throat swelling, SOB or lightheadedness with hypotension: Yes Has patient had a PCN reaction causing severe rash involving mucus membranes or skin necrosis: No Has patient had a PCN reaction that required hospitalization: No Has patient had a PCN reaction occurring within the last 10 years: No If all of the above answers are "NO", then may proceed with Cephalosporin use.      Medication List    TAKE these medications   ALPRAZolam 0.5 MG tablet Commonly known as:  XANAX Take 0.5 mg by mouth at bedtime.   amLODipine 5 MG tablet Commonly known as:  NORVASC Take 5 mg by mouth daily.   aspirin EC 81 MG tablet Take 81 mg by mouth daily.   atorvastatin 40 MG tablet Commonly known as:  LIPITOR Take 1 tablet (40 mg total) by mouth daily.   ENULOSE 10 GM/15ML Soln Generic drug:  lactulose (encephalopathy) TAKE TWO TABLESPOONFULS AT BEDTIME AS NEEDED. What changed:  See the new instructions.   finasteride 5 MG tablet Commonly known as:  PROSCAR Take 5 mg by mouth daily with lunch.   HYDROcodone-acetaminophen 5-325 MG tablet Commonly known as:  NORCO/VICODIN Take 1 tablet by mouth daily as needed for moderate pain.   lisinopril 40 MG tablet Commonly known as:  PRINIVIL,ZESTRIL Take 40 mg by mouth daily.   mirtazapine 15 MG tablet Commonly known as:  REMERON TAKE (1) TABLET BY MOUTH  AT BEDTIME.   pantoprazole 40 MG tablet Commonly known as:  PROTONIX TAKE (1) TABLET BY MOUTH ONCE DAILY.            Discharge Care Instructions        Start     Ordered   03/21/17 0000  Resume previous diet     03/21/17 0748   03/21/17 0000  Driving Restrictions    Comments:  No driving for  1 week   03/21/17 0748   03/21/17 0000  Lifting restrictions    Comments:  No heavy lifting for 3 weeks   03/21/17 0748   03/21/17 0000  Call MD for:  temperature >100.5     03/21/17 0748   03/21/17 0000  Call MD for:  redness, tenderness, or signs of infection (pain, swelling, bleeding, redness, odor or green/yellow discharge around incision site)     03/21/17 0748   03/21/17 0000  Call MD for:  severe or increased pain, loss or decreased feeling  in affected limb(s)     03/21/17 0748   03/21/17 0000  Discharge instructions    Comments:  You may shower in 24 hours as neded   03/21/17 0748   03/21/17 0000  Increase activity slowly    Comments:  Walk with assistance use walker or cane as needed   03/21/17 0748   03/20/17 0000  Resume previous diet     03/20/17 1108   03/20/17 0000  Driving Restrictions    Comments:  No driving for 1 week   03/20/17 1108   03/20/17 0000  Lifting restrictions    Comments:  No lifting for 3-4 weeks   03/20/17 1108   03/20/17 0000  Call MD for:  temperature >100.5     03/20/17 1108   03/20/17 0000  Call MD for:  redness, tenderness, or signs of infection (pain, swelling, bleeding, redness, odor or green/yellow discharge around incision site)     03/20/17 1108   03/20/17 0000  Call MD for:  severe or increased pain, loss or decreased feeling  in affected limb(s)     03/20/17 1108   03/20/17 0000  Discharge instructions    Comments:  You may shower daily in 24 hours.   03/20/17 1108   03/20/17 0000  Increase activity slowly    Comments:  Walk with assistance use walker or cane as needed   03/20/17 1108     Verbal and written Discharge  instructions given to the patient. Wound care per Discharge AVS Follow-up Information    Conrad Rea, MD Follow up in 2 week(s).   Specialties:  Vascular Surgery, Cardiology Why:  office will call Contact information: [address] [place] [address] [phone]           Signed: Laurence Slate [facility] 03/22/2017, 1:04 PM   Addendum  This patient underwent L CEA for sub-total occlusion of L ICA.  The procedure was uneventful.  POD #1, patient was neurologically intact with mild left marginal mandibular palsy.  Swallow was intact without any neck hematoma.  The patient will follow up in the office in 2 weeks for wound check.   [name], MD, FACS [facility] [place] Office: [phone] Pager: [phone]  03/22/2017, 5:35 PM   --- For VQI Registry use --- Instructions: Press F2 to tab through selections.  Delete question if not applicable.   Modified Rankin score at D/C (0-6): Rankin Score=0  IV medication needed for:  1. Hypertension: No 2. Hypotension: No  Post-op Complications: No  1. Post-op CVA or TIA: No  If yes: Event classification (right eye, left eye, right cortical, left cortical, verterobasilar, other):   If yes: Timing of event (intra-op, <6 hrs post-op, >=6 hrs post-op, unknown):   2. CN injury: No  If yes: CN  injuried   3. Myocardial infarction: No  If yes: Dx by (EKG or clinical, Troponin):   4.  CHF: No  5.  Dysrhythmia (new): No  6. Wound infection: No  7. Reperfusion symptoms: No  8. Return to OR: No  If yes: return to OR for (bleeding, neurologic, other CEA incision, other):   Discharge medications: Statin use:  Yes ASA use:  Yes Beta blocker use:  No  for medical reason   ACE-Inhibitor use:  Yes P2Y12 Antagonist use: [x ] None, [ ]  Plavix, [ ]  Plasugrel, [ ]  Ticlopinine, [ ]  Ticagrelor, [ ]  Other, [ ]  No for medical reason, [ ]  Non-compliant, [ ]  Not-indicated Anti-coagulant use:  [ x] None, [  ] Warfarin, [ ]  Rivaroxaban, [ ]  Dabigatran, [ ]  Other, [ ]  No for medical reason, [ ]  Non-compliant, [ ]  Not-indicated

## 2017-03-23 NOTE — Consult Note (Signed)
           [facility] CM Primary Care Navigator  03/23/2017  Peter [name] 23-Aug-1932 [id number]   Attempt to seepatient at the bedsideto identify possible discharge needs but he was already discharged per staff report. Patient was discharged home.  Primary care provider's office called (Alyssa) to notify of patient's discharge and need for post hospital follow-up and transition of care.  Noted that patient has a scheduled follow-up appointment with provider (Dr. Bridgett Larsson) on 04/07/17 at 11:45.  For questions, please contact:  Dannielle Huh, BSN, RN- [facility] Primary Care Navigator  Telephone: [phone] [place]

## 2017-03-24 ENCOUNTER — Encounter (HOSPITAL_COMMUNITY): Payer: Medicare Other

## 2017-03-24 ENCOUNTER — Encounter: Payer: Medicare Other | Admitting: Vascular Surgery

## 2017-04-03 ENCOUNTER — Encounter: Payer: Self-pay | Admitting: Family

## 2017-04-03 ENCOUNTER — Ambulatory Visit (INDEPENDENT_AMBULATORY_CARE_PROVIDER_SITE_OTHER): Payer: Self-pay | Admitting: Family

## 2017-04-03 VITALS — BP 164/69 | HR 76 | Temp 97.5°F | Resp 16 | Ht 70.0 in | Wt 151.0 lb

## 2017-04-03 DIAGNOSIS — Z9889 Other specified postprocedural states: Secondary | ICD-10-CM

## 2017-04-03 DIAGNOSIS — L219 Seborrheic dermatitis, unspecified: Secondary | ICD-10-CM

## 2017-04-03 DIAGNOSIS — I779 Disorder of arteries and arterioles, unspecified: Secondary | ICD-10-CM

## 2017-04-03 DIAGNOSIS — I739 Peripheral vascular disease, unspecified: Secondary | ICD-10-CM

## 2017-04-03 NOTE — Progress Notes (Signed)
Postoperative Visit   History of Present Illness  TAHJAE [name] is a 83 y.o. male who is s/p left carotid endarterectomy with bovine patch angioplasty on 03-20-17 by Dr. Bridgett Larsson for left asymptomatic carotid stenosis near occluded.  FINDING(S): 1.  Continuous Doppler audible flow signatures are appropriate for each carotid artery. 2.  No evidence of intimal flap visualized on transverse or longitudinal ultrasonography. 3.  Carotid plaque: near occluded, heavily calcified 4.  Vagus nerve: normal position 5.  Hypoglossal nerve: visualized, required mobilization   Pt returns today with c/o rash on left side of neck. The severely scaly rash actually extends to both sides of his face, behind his ears, both sides of his nose, both sides of his neck, and above both eyebrows.  He has a 2 week follow up scheduled with Dr. Bridgett Larsson on 04-07-17.   The patient's neck incision is healing well, surgical glue and scaly skin on and surrounding incision.  The patient has had no stroke or Peter [name] symptoms.  For VQI Use Only  PRE-ADM LIVING: Home  AMB STATUS: Ambulatory  Past Medical History:  Diagnosis Date  . Anxiety   . Arthritis   . BPH (benign prostatic hyperplasia)   . Chronic pain   . Complication of anesthesia    had an anxiety attack once  . Depression   . GERD (gastroesophageal reflux disease)   . Hiatal hernia    small  . HTN (hypertension)   . Internal carotid artery stenosis, left   . Prostate disease    h/o elevated PSA, neg bx, [facility]  . Sciatica   . Wears glasses   . Wears partial dentures     Past Surgical History:  Procedure Laterality Date  . BIOPSY  01/29/2014   Procedure: BIOPSY;  Surgeon: Daneil Dolin, MD;  Location: [facility];  Service: Endoscopy;;  Gastric and Esophageal  . CAROTID ENDARTERECTOMY Left 03/20/2017  . CATARACT EXTRACTION W/PHACO  05/21/2012   Procedure: CATARACT EXTRACTION PHACO AND INTRAOCULAR LENS PLACEMENT (IOC);  Surgeon: Tonny Branch,  MD;  Location: [facility] ORS;  Service: Ophthalmology;  Laterality: Right;  CDE 19.34  . COLONOSCOPY  12/2004   diverticulosis, hyperplastic polyps, hemorrhoids  . ENDARTERECTOMY Left 03/20/2017   Procedure: ENDARTERECTOMY CAROTID-LEFT;  Surgeon: Conrad Fitchburg, MD;  Location: [place];  Service: Vascular;  Laterality: Left;  . ESOPHAGOGASTRODUODENOSCOPY  2003   schatzki ring, small vascular abnormality of duodenal bulb ?Dieulafoy's lesion (ablated)  . ESOPHAGOGASTRODUODENOSCOPY  11/10/10   Dr. Gala Romney dilation 2 rings with 41F  . ESOPHAGOGASTRODUODENOSCOPY N/A 01/29/2014   Dr.Rourk- multiple esophageal rings/webs- s/p dialation. small hiatal hernia. abnormal gastric mucosa. bx= mild chronic inactive gastritis (oxyntic mucosa), squamous mucosa with epithelial changes consistent with reflux related injury.  Marland Kitchen MULTIPLE TOOTH EXTRACTIONS    . PATCH ANGIOPLASTY Left 03/20/2017  . PATCH ANGIOPLASTY Left 03/20/2017   Procedure: PATCH ANGIOPLASTY;  Surgeon: Conrad Rowland, MD;  Location: [facility] OR;  Service: Vascular;  Laterality: Left;    Social History   Social History  . Marital status: Married    Spouse name: N/A  . Number of children: 1  . Years of education: N/A   Occupational History  . retired     Orthoptist   Social History Main Topics  . Smoking status: Former Smoker    Packs/day: 1.00    Years: 60.00    Types: Cigarettes    Quit date: 03/18/2017  . Smokeless tobacco: Never Used  . Alcohol use  No     Comment: quit 10  years ago  . Drug use: No  . Sexual activity: Not Currently    Partners: Female    Birth control/ protection: None     Comment: spouse, has to use viagra   Other Topics Concern  . Not on file   Social History Narrative  . No narrative on file    Current Outpatient Prescriptions on File Prior to Visit  Medication Sig Dispense Refill  . ALPRAZolam (XANAX) 0.5 MG tablet Take 0.5 mg by mouth at bedtime.     Marland Kitchen amLODipine (NORVASC) 5 MG tablet Take 5 mg by mouth daily.     Marland Kitchen  aspirin EC 81 MG tablet Take 81 mg by mouth daily.     Marland Kitchen atorvastatin (LIPITOR) 40 MG tablet Take 1 tablet (40 mg total) by mouth daily. 30 tablet 11  . ENULOSE 10 GM/15ML SOLN TAKE TWO TABLESPOONFULS AT BEDTIME AS NEEDED. (Patient taking differently: TAKE TWO TABLESPOONFULS AT BEDTIME) 946 mL 0  . finasteride (PROSCAR) 5 MG tablet Take 5 mg by mouth daily with lunch.     Marland Kitchen HYDROcodone-acetaminophen (NORCO/VICODIN) 5-325 MG per tablet Take 1 tablet by mouth daily as needed for moderate pain.    Marland Kitchen lisinopril (PRINIVIL,ZESTRIL) 40 MG tablet Take 40 mg by mouth daily.     . mirtazapine (REMERON) 15 MG tablet TAKE (1) TABLET BY MOUTH AT BEDTIME. 30 tablet 3  . pantoprazole (PROTONIX) 40 MG tablet TAKE (1) TABLET BY MOUTH ONCE DAILY. 30 tablet 1   Current Facility-Administered Medications on File Prior to Visit  Medication Dose Route Frequency Provider Last Rate Last Dose  . shugarcaine ophthalmic solution    PRN Tonny Branch, MD        Physical Examination  Vitals:   04/03/17 1434 04/03/17 1438  BP: (!) 171/72 (!) 164/69  Pulse: 76   Resp: 16   Temp: (!) 97.5 F (36.4 C)   TempSrc: Oral   SpO2: 99%   Weight: 151 lb (68.5 kg)   Height: 5\' 10"  (1.778 m)    Body mass index is 21.67 kg/m.  Left side of Neck: Incision is healing well, no signs of infection.  He has severe scaly seborrheic dermatitis on his face and neck that son states pt had prior to surgery.   Neuro: CN 2-12 are intact, Motor strength is 5/5 bilaterally, sensation is grossly intact  Medical Decision Making  [name] is a 83 y.o. male who presents s/p left CEA on 03-20-17. Family concern re rash appears to be chronic severe scaly seborrheic dermatitis on his face and neck.  I cleansed his left CEA incision with wound cleanser and removed most of the surgical glue; no signs of infection, minimal swelling at caudal aspect of incision.  He has showered; continue daily shower with liquid antibacterial soap.  When  left CEA incision has fully healed, he may use OTC medicated shampoo such as Selsun Blue or Head and Shoulders to address the seborrheic dermatitis.   Follow up with Dr. Bridgett Larsson as scheduled on 04-07-17.   The patient's neck incision is healing with no stroke symptoms.  Thank you for allowing [place] to participate in this patient's care.  Genetta Fiero, Sharmon Leyden, RN, MSN, FNP-C [facility] [place] Office: [phone]  04/03/2017, 2:56 PM  Clinic MD: Trula Slade

## 2017-04-03 NOTE — Patient Instructions (Signed)
Seborrheic Dermatitis, Adult Seborrheic dermatitis is a skin disease that causes red, scaly patches. It usually occurs on the scalp, and it is often called dandruff. The patches may appear on other parts of the body. Skin patches tend to appear where there are many oil glands in the skin. Areas of the body that are commonly affected include:  Scalp.  Skin folds of the body.  Ears.  Eyebrows.  Neck.  Face.  Armpits.  The bearded area of men's faces.  The condition may come and go for no known reason, and it is often long-lasting (chronic). What are the causes? The cause of this condition is not known. What increases the risk? This condition is more likely to develop in people who:  Have certain conditions, such as: ? HIV (human immunodeficiency virus). ? AIDS (acquired immunodeficiency syndrome). ? Parkinson disease. ? Mood disorders, such as depression.  Are 40-60 years old.  What are the signs or symptoms? Symptoms of this condition include:  Thick scales on the scalp.  Redness on the face or in the armpits.  Skin that is flaky. The flakes may be white or yellow.  Skin that seems oily or dry but is not helped with moisturizers.  Itching or burning in the affected areas.  How is this diagnosed? This condition is diagnosed with a medical history and physical exam. A sample of your skin may be tested (skin biopsy). You may need to see a skin specialist (dermatologist). How is this treated? There is no cure for this condition, but treatment can help to manage the symptoms. You may get treatment to remove scales, lower the risk of skin infection, and reduce swelling or itching. Treatment may include:  Creams that reduce swelling and irritation (steroids).  Creams that reduce skin yeast.  Medicated shampoo, soaps, moisturizing creams, or ointments.  Medicated moisturizing creams or ointments.  Follow these instructions at home:  Apply over-the-counter and  prescription medicines only as told by your health care provider.  Use any medicated shampoo, soaps, skin creams, or ointments only as told by your health care provider.  Keep all follow-up visits as told by your health care provider. This is important. Contact a health care provider if:  Your symptoms do not improve with treatment.  Your symptoms get worse.  You have new symptoms. This information is not intended to replace advice given to you by your health care provider. Make sure you discuss any questions you have with your health care provider. Document Released: 06/27/2005 Document Revised: 01/15/2016 Document Reviewed: 10/15/2015 Elsevier Interactive Patient Education  2018 [employer]  

## 2017-04-03 NOTE — Progress Notes (Signed)
    Postoperative Visit   History of Present Illness   Peter [name] is a 83 y.o. male who presents for postoperative follow-up for: left CEA (Date: 03/20/17).  The patient's neck incision is healed.  The patient has had no stroke or TIA symptoms.  The patient continues to have swallow issues related to his esophageal stricture.  He is able to swallow water.   For VQI Use Only   PRE-ADM LIVING: Home  AMB STATUS: Ambulatory   Physical Examination   Vitals:   04/07/17 1129 04/07/17 1131  BP: (!) 159/64 (!) 158/67  Pulse: 67 67  SpO2: 98%   Weight: 152 lb 11.2 oz (69.3 kg)   Height: 5\' 10"  (1.778 m)     left Neck: Incision is healed  Neuro: CN 2-12 are intact , Motor strength is 5/5 bilaterally, sensation is grossly intact   Medical Decision Making   Peter [name] is a 83 y.o. male who presents s/p left CEA for asx sub-total occlusion of L carotid arery   This patient's swallow issues are unrelated to any CN injury.  The patient can swallow water without choking.  The patient's neck incision is healing with no stroke symptoms. I discussed in depth with the patient the nature of atherosclerosis, and emphasized the importance of maximal medical management including strict control of blood pressure, blood glucose, and lipid levels, obtaining regular exercise, anti-platelet use and cessation of smoking.   The patient is currently not on on statin as not medically indicated.  The patient is currently on an anti-platelet: ASA. The patient is aware that without maximal medical management the underlying atherosclerotic disease process will progress, limiting the benefit of any interventions. The patient's surveillance will included routine carotid duplex studies which will be completed in: 9 months, at which time the patient will be re-evaluated.   I emphasized the importance of routine surveillance of the carotid arteries as recurrence of stenosis is possible, especially with  proper management of underlying atherosclerotic disease. The patient agrees to participate in their maximal medical care and routine surveillance.  Thank you for allowing [place] to participate in this patient's care.  [name], MD, FACS [facility] Office: [phone] Pager: [phone]

## 2017-04-07 ENCOUNTER — Encounter: Payer: Self-pay | Admitting: Vascular Surgery

## 2017-04-07 ENCOUNTER — Ambulatory Visit (INDEPENDENT_AMBULATORY_CARE_PROVIDER_SITE_OTHER): Payer: Self-pay | Admitting: Vascular Surgery

## 2017-04-07 VITALS — BP 158/67 | HR 67 | Ht 70.0 in | Wt 152.7 lb

## 2017-04-07 DIAGNOSIS — I779 Disorder of arteries and arterioles, unspecified: Secondary | ICD-10-CM

## 2017-04-07 DIAGNOSIS — I739 Peripheral vascular disease, unspecified: Principal | ICD-10-CM

## 2017-04-11 ENCOUNTER — Ambulatory Visit (INDEPENDENT_AMBULATORY_CARE_PROVIDER_SITE_OTHER): Payer: Medicare Other | Admitting: Gastroenterology

## 2017-04-11 ENCOUNTER — Encounter: Payer: Self-pay | Admitting: Gastroenterology

## 2017-04-11 VITALS — BP 152/65 | HR 65 | Temp 97.0°F | Ht 70.0 in | Wt 152.4 lb

## 2017-04-11 DIAGNOSIS — K219 Gastro-esophageal reflux disease without esophagitis: Secondary | ICD-10-CM | POA: Diagnosis not present

## 2017-04-11 DIAGNOSIS — R1310 Dysphagia, unspecified: Secondary | ICD-10-CM

## 2017-04-11 DIAGNOSIS — K5900 Constipation, unspecified: Secondary | ICD-10-CM | POA: Diagnosis not present

## 2017-04-11 DIAGNOSIS — R1319 Other dysphagia: Secondary | ICD-10-CM

## 2017-04-11 MED ORDER — PANTOPRAZOLE SODIUM 40 MG PO TBEC: 40.0000 mg | DELAYED_RELEASE_TABLET | Freq: Every day | ORAL | 11 refills | Status: DC

## 2017-04-11 MED ORDER — LACTULOSE 10 GM/15ML PO SOLN: 20.0000 g | Freq: Every evening | ORAL | 11 refills | Status: DC | PRN

## 2017-04-11 NOTE — Progress Notes (Signed)
Primary Care Physician: Redmond School, MD  Primary Gastroenterologist:  Garfield Cornea, MD   Chief Complaint  Patient presents with  . Medication Refill    protonix and lactulose     HPI: Peter [name] is a 83 y.o. male here for follow up. Last seen in 2015. H/o schatzki's ring/gerd. Last EGD 01/2014. Had a very friable tender appearing esophageal mucosa. It was significantly dilated with just passage of the scope. Dr. Gala Romney did not pass a bougie. He states improvement dysphagia but short-lived.   Today he continues to have intermittent dysphagia. Can even be to liquids. Happens sporadically. May go weeks in between with no symptoms. Heartburn well controlled. Takes lactulose or metamucil for constipation with good relief. No melena, brbpr, abd pain.   He had left CEA,  3 weeks ago.    Current Outpatient Prescriptions  Medication Sig Dispense Refill  . ALPRAZolam (XANAX) 0.5 MG tablet Take 0.5 mg by mouth at bedtime.     Marland Kitchen amLODipine (NORVASC) 5 MG tablet Take 5 mg by mouth daily.     Marland Kitchen aspirin EC 81 MG tablet Take 81 mg by mouth daily.     Marland Kitchen atorvastatin (LIPITOR) 40 MG tablet Take 1 tablet (40 mg total) by mouth daily. 30 tablet 11  . ENULOSE 10 GM/15ML SOLN TAKE TWO TABLESPOONFULS AT BEDTIME AS NEEDED. (Patient taking differently: TAKE TWO TABLESPOONFULS AT BEDTIME) 946 mL 0  . finasteride (PROSCAR) 5 MG tablet Take 5 mg by mouth daily with lunch.     Marland Kitchen HYDROcodone-acetaminophen (NORCO/VICODIN) 5-325 MG per tablet Take 1 tablet by mouth daily as needed for moderate pain.    Marland Kitchen lisinopril (PRINIVIL,ZESTRIL) 40 MG tablet Take 40 mg by mouth daily.     . mirtazapine (REMERON) 15 MG tablet TAKE (1) TABLET BY MOUTH AT BEDTIME. 30 tablet 3  . pantoprazole (PROTONIX) 40 MG tablet TAKE (1) TABLET BY MOUTH ONCE DAILY. 30 tablet 1   No current facility-administered medications for this visit.    Facility-Administered Medications Ordered in Other Visits  Medication Dose Route  Frequency Provider Last Rate Last Dose  . shugarcaine ophthalmic solution    PRN Tonny Branch, MD        Allergies as of 04/11/2017 - Review Complete 04/11/2017  Allergen Reaction Noted  . Penicillins Rash 05/17/2012    ROS:  General: Negative for anorexia, weight loss, fever, chills, fatigue, weakness. ENT: Negative for hoarseness,  nasal congestion.see hpi. CV: Negative for chest pain, angina, palpitations, dyspnea on exertion, peripheral edema.  Respiratory: Negative for dyspnea at rest, dyspnea on exertion, cough, sputum, wheezing.  GI: See history of present illness. GU:  Negative for dysuria, hematuria, urinary incontinence, urinary frequency, nocturnal urination.  Endo: Negative for unusual weight change.    Physical Examination:   BP (!) 152/65   Pulse 65   Temp (!) 97 F (36.1 C) (Oral)   Ht 5\' 10"  (1.778 m)   Wt 152 lb 6.4 oz (69.1 kg)   BMI 21.87 kg/m   General: Well-nourished, well-developed in no acute distress.  Eyes: No icterus. Mouth: Oropharyngeal mucosa moist and pink , no lesions erythema or exudate. Lungs: Clear to auscultation bilaterally.  Heart: Regular rate and rhythm, no murmurs rubs or gallops.  Abdomen: Bowel sounds are normal, nontender, nondistended, no hepatosplenomegaly or masses, no abdominal bruits or hernia , no rebound or guarding.   Extremities: No lower extremity edema. No clubbing or deformities. Neuro: Alert and oriented x 4  Skin: Warm and dry, no jaundice.   Psych: Alert and cooperative, normal mood and affect.   Imaging Studies: No results found.  Impression/plan:  Clinically GERD is doing well on pantoprazole. Intermittent dysphagia to solids/liquids with prior known esophageal strictures as outlined. Recovering from left CEA. Consider EGD/ED when patient is ready. As per Dr. Gala Romney, he can be triaged.   Constipation well managed with lactulose. Plan to see him back in a year otherwise.

## 2017-04-11 NOTE — Patient Instructions (Signed)
1. Continue pantoprazole and lactulose as before. RXs sent to pharmacy.  2. Call if you decide to pursue stretching of your esophagus.  3. Return to the office in one year or call sooner if needed.

## 2017-04-12 NOTE — Progress Notes (Signed)
cc'ed to pcp °

## 2017-05-09 ENCOUNTER — Other Ambulatory Visit: Payer: Self-pay | Admitting: Gastroenterology

## 2017-05-10 ENCOUNTER — Other Ambulatory Visit: Payer: Self-pay | Admitting: Internal Medicine

## 2017-05-10 DIAGNOSIS — I779 Disorder of arteries and arterioles, unspecified: Secondary | ICD-10-CM | POA: Diagnosis not present

## 2017-05-10 DIAGNOSIS — E782 Mixed hyperlipidemia: Secondary | ICD-10-CM | POA: Diagnosis not present

## 2017-05-11 LAB — HEPATIC FUNCTION PANEL
ALBUMIN: 4.3 g/dL (ref 3.5–4.7)
ALK PHOS: 159 IU/L — AB (ref 39–117)
ALT: 11 IU/L (ref 0–44)
AST: 19 IU/L (ref 0–40)
BILIRUBIN, DIRECT: 0.18 mg/dL (ref 0.00–0.40)
Bilirubin Total: 0.6 mg/dL (ref 0.0–1.2)
Total Protein: 7 g/dL (ref 6.0–8.5)

## 2017-05-11 LAB — LIPID PANEL
Chol/HDL Ratio: 3.1 ratio (ref 0.0–5.0)
Cholesterol, Total: 107 mg/dL (ref 100–199)
HDL: 34 mg/dL — AB (ref >39)
LDL Calculated: 50 mg/dL (ref 0–99)
Triglycerides: 116 mg/dL (ref 0–149)
VLDL Cholesterol Cal: 23 mg/dL (ref 5–40)

## 2017-06-07 NOTE — Addendum Note (Signed)
Addended by: Lianne Cure A on: 06/07/2017 12:57 PM   Modules accepted: Orders

## 2017-07-17 DIAGNOSIS — Z1389 Encounter for screening for other disorder: Secondary | ICD-10-CM | POA: Diagnosis not present

## 2017-07-17 DIAGNOSIS — M7732 Calcaneal spur, left foot: Secondary | ICD-10-CM | POA: Diagnosis not present

## 2017-07-17 DIAGNOSIS — Z6823 Body mass index (BMI) 23.0-23.9, adult: Secondary | ICD-10-CM | POA: Diagnosis not present

## 2017-07-17 DIAGNOSIS — G894 Chronic pain syndrome: Secondary | ICD-10-CM | POA: Diagnosis not present

## 2017-07-25 ENCOUNTER — Inpatient Hospital Stay (HOSPITAL_COMMUNITY): Payer: Medicare Other

## 2017-07-25 ENCOUNTER — Inpatient Hospital Stay (HOSPITAL_COMMUNITY): Payer: Medicare Other | Attending: Hematology and Oncology | Admitting: Oncology

## 2017-07-25 ENCOUNTER — Other Ambulatory Visit: Payer: Self-pay

## 2017-07-25 ENCOUNTER — Encounter (HOSPITAL_COMMUNITY): Payer: Self-pay | Admitting: Oncology

## 2017-07-25 VITALS — BP 168/67 | HR 88 | Temp 97.7°F | Resp 18 | Wt 157.2 lb

## 2017-07-25 DIAGNOSIS — R630 Anorexia: Secondary | ICD-10-CM | POA: Insufficient documentation

## 2017-07-25 DIAGNOSIS — F418 Other specified anxiety disorders: Secondary | ICD-10-CM | POA: Diagnosis not present

## 2017-07-25 DIAGNOSIS — D473 Essential (hemorrhagic) thrombocythemia: Secondary | ICD-10-CM | POA: Diagnosis not present

## 2017-07-25 DIAGNOSIS — D72829 Elevated white blood cell count, unspecified: Secondary | ICD-10-CM | POA: Diagnosis not present

## 2017-07-25 DIAGNOSIS — K219 Gastro-esophageal reflux disease without esophagitis: Secondary | ICD-10-CM | POA: Insufficient documentation

## 2017-07-25 DIAGNOSIS — N400 Enlarged prostate without lower urinary tract symptoms: Secondary | ICD-10-CM | POA: Insufficient documentation

## 2017-07-25 DIAGNOSIS — Z79899 Other long term (current) drug therapy: Secondary | ICD-10-CM | POA: Insufficient documentation

## 2017-07-25 DIAGNOSIS — I10 Essential (primary) hypertension: Secondary | ICD-10-CM | POA: Diagnosis not present

## 2017-07-25 DIAGNOSIS — F17210 Nicotine dependence, cigarettes, uncomplicated: Secondary | ICD-10-CM | POA: Insufficient documentation

## 2017-07-25 LAB — CBC WITH DIFFERENTIAL/PLATELET
BASOS PCT: 1 %
Basophils Absolute: 0.1 10*3/uL (ref 0.0–0.1)
EOS ABS: 0.5 10*3/uL (ref 0.0–0.7)
EOS PCT: 5 %
HCT: 41 % (ref 39.0–52.0)
Hemoglobin: 13.6 g/dL (ref 13.0–17.0)
Lymphocytes Relative: 25 %
Lymphs Abs: 2.6 10*3/uL (ref 0.7–4.0)
MCH: 31.5 pg (ref 26.0–34.0)
MCHC: 33.2 g/dL (ref 30.0–36.0)
MCV: 94.9 fL (ref 78.0–100.0)
MONOS PCT: 11 %
Monocytes Absolute: 1.2 10*3/uL — ABNORMAL HIGH (ref 0.1–1.0)
Neutro Abs: 6.2 10*3/uL (ref 1.7–7.7)
Neutrophils Relative %: 58 %
PLATELETS: 351 10*3/uL (ref 150–400)
RBC: 4.32 MIL/uL (ref 4.22–5.81)
RDW: 12.3 % (ref 11.5–15.5)
WBC: 10.5 10*3/uL (ref 4.0–10.5)

## 2017-07-25 NOTE — Progress Notes (Signed)
PROGRESS NOTE  Redmond School, MD [address] / [place] [address]  DIAGNOSIS:  Leukocytosis Thrombocytosis  CURRENT THERAPY: observation  INTERVAL HISTORY: Peter [name] 84 y.o. male returns for follow up of leukocytosis and thrombocytosis.  Patient presents with his daughter today for continued follow-up.  Patient states overall he is doing well.  He denies any recent infections.  He continues to smoke cigarettes.  He denies any chest pain, shortness of breath, dizziness, headaches, blurry vision or abdominal pain.  He is here for his labs.  He is largely without complaints.  MEDICAL HISTORY: Past Medical History:  Diagnosis Date  . Anxiety   . Arthritis   . BPH (benign prostatic hyperplasia)   . Chronic pain   . Complication of anesthesia    had an anxiety attack once  . Depression   . GERD (gastroesophageal reflux disease)   . Hiatal hernia    small  . HTN (hypertension)   . Internal carotid artery stenosis, left   . Prostate disease    h/o elevated PSA, neg bx, [facility]  . Sciatica   . Wears glasses   . Wears partial dentures     SURGICAL HISTORY: Past Surgical History:  Procedure Laterality Date  . BIOPSY  01/29/2014   Procedure: BIOPSY;  Surgeon: Daneil Dolin, MD;  Location: [facility];  Service: Endoscopy;;  Gastric and Esophageal  . CAROTID ENDARTERECTOMY Left 03/20/2017  . CATARACT EXTRACTION W/PHACO  05/21/2012   Procedure: CATARACT EXTRACTION PHACO AND INTRAOCULAR LENS PLACEMENT (IOC);  Surgeon: Tonny Branch, MD;  Location: [facility] ORS;  Service: Ophthalmology;  Laterality: Right;  CDE 19.34  . COLONOSCOPY  12/2004   diverticulosis, hyperplastic polyps, hemorrhoids  . ENDARTERECTOMY Left 03/20/2017   Procedure: ENDARTERECTOMY CAROTID-LEFT;  Surgeon: Conrad [place], MD;  Location: [place];  Service: Vascular;  Laterality: Left;  . ESOPHAGOGASTRODUODENOSCOPY  2003   schatzki ring, small vascular abnormality of duodenal bulb ?Dieulafoy's  lesion (ablated)  . ESOPHAGOGASTRODUODENOSCOPY  11/10/10   Dr. Gala Romney dilation 2 rings with 26F  . ESOPHAGOGASTRODUODENOSCOPY N/A 01/29/2014   Dr.Rourk- multiple esophageal rings/webs- s/p dialation. small hiatal hernia. abnormal gastric mucosa. bx= mild chronic inactive gastritis (oxyntic mucosa), squamous mucosa with epithelial changes consistent with reflux related injury.  Marland Kitchen MULTIPLE TOOTH EXTRACTIONS    . PATCH ANGIOPLASTY Left 03/20/2017  . PATCH ANGIOPLASTY Left 03/20/2017   Procedure: PATCH ANGIOPLASTY;  Surgeon: Conrad [place], MD;  Location: [facility];  Service: Vascular;  Laterality: Left;    SOCIAL HISTORY: Social History   Socioeconomic History  . Marital status: Married    Spouse name: Not on file  . Number of children: 1  . Years of education: Not on file  . Highest education level: Not on file  Social Needs  . Financial resource strain: Not on file  . Food insecurity - worry: Not on file  . Food insecurity - inability: Not on file  . Transportation needs - medical: Not on file  . Transportation needs - non-medical: Not on file  Occupational History  . Occupation: retired    Comment: Orthoptist  Tobacco Use  . Smoking status: Former Smoker    Packs/day: 1.00    Years: 60.00    Pack years: 60.00    Types: Cigarettes    Last attempt to quit: 03/18/2017    Years since quitting: 0.3  . Smokeless tobacco: Never Used  Substance and Sexual Activity  . Alcohol use: No    Comment: quit 10  years ago  . Drug use: No  . Sexual activity: Not Currently    Partners: Female    Birth control/protection: None    Comment: spouse, has to use viagra  Other Topics Concern  . Not on file  Social History Narrative  . Not on file    FAMILY HISTORY: Family History  Problem Relation Age of Onset  . Stroke Father 52       deceased  . Heart attack Mother   . Colon cancer Neg Hx    Review of Systems  Constitutional: Negative.        No loss of appetite  HENT: Negative.   Eyes:  Negative.   Respiratory: Negative.  Negative for shortness of breath.   Cardiovascular: Negative.  Negative for chest pain.  Gastrointestinal: Negative.  Negative for abdominal pain, blood in stool, constipation, diarrhea and melena.  Genitourinary: Negative.   Musculoskeletal: Negative.   Skin: Negative.   Neurological: Negative.   Endo/Heme/Allergies: Negative.   Psychiatric/Behavioral: Negative for depression. The patient is not nervous/anxious.   All other systems reviewed and are negative. 14 point review of systems was performed and is negative except as detailed under history of present illness and above  PHYSICAL EXAMINATION ECOG PERFORMANCE STATUS: 0 - Asymptomatic  Vitals:   07/25/17 1404  BP: (!) 168/67  Pulse: 88  Resp: 18  Temp: 97.7 F (36.5 C)  SpO2: 99%     Physical Exam  Constitutional: He is oriented to person, place, and time and well-developed, well-nourished, and in no distress.  HENT:  Head: Normocephalic and atraumatic.  Mouth/Throat: Oropharynx is clear and moist.  Eyes: Conjunctivae and EOM are normal. Pupils are equal, round, and reactive to light.  Neck: Normal range of motion. Neck supple.  Cardiovascular: Normal rate, regular rhythm and normal heart sounds.  Pulmonary/Chest: Effort normal and breath sounds normal.  Abdominal: Soft. Bowel sounds are normal.  Musculoskeletal: Normal range of motion.  Neurological: He is alert and oriented to person, place, and time. Gait normal.  Skin: Skin is warm and dry.  Nursing note and vitals reviewed.  LABORATORY DATA: CBC    Component Value Date/Time   WBC 10.5 07/25/2017 1329   RBC 4.32 07/25/2017 1329   HGB 13.6 07/25/2017 1329   HCT 41.0 07/25/2017 1329   PLT 351 07/25/2017 1329   MCV 94.9 07/25/2017 1329   MCH 31.5 07/25/2017 1329   MCHC 33.2 07/25/2017 1329   RDW 12.3 07/25/2017 1329   LYMPHSABS 2.6 07/25/2017 1329   MONOABS 1.2 (H) 07/25/2017 1329   EOSABS 0.5 07/25/2017 1329    BASOSABS 0.1 07/25/2017 1329   CMP     Component Value Date/Time   NA 132 (L) 03/21/2017 0227   K 4.3 03/21/2017 0227   CL 104 03/21/2017 0227   CO2 22 03/21/2017 0227   GLUCOSE 126 (H) 03/21/2017 0227   BUN 11 03/21/2017 0227   CREATININE 0.99 03/21/2017 0227   CALCIUM 8.7 (L) 03/21/2017 0227   PROT 7.0 05/10/2017 0921   ALBUMIN 4.3 05/10/2017 0921   AST 19 05/10/2017 0921   ALT 11 05/10/2017 0921   ALKPHOS 159 (H) 05/10/2017 0921   BILITOT 0.6 05/10/2017 0921   GFRNONAA >60 03/21/2017 0227   GFRAA >60 03/21/2017 0227   PATHOLOGY:   ASSESSMENT and THERAPY PLAN:  Leukocytosis likely due to infection and previous steroid use-stable. Thrombocytosis-reactive as an acute phase reactant- resolved. Decreased appetite on marinol. Previous leukocytosis workup included Jak to V6 17 with  reflex to exon 12, MPL, CALR, quantitative BCR-ABL.  They were all negative for myeloproliferative disorder and CML respectively.  Sedimentation rate and CRP were elevated indicating inflammation which possibly could explain why his thrombocytosis has resolved since platelets can act as an acute phase reactant.  PLAN: Reviewed labs with patient and daughter today.  Both thrombocytosis and leukocytosis have resolved.  Encouraged patient to return in 6 months for follow-up with a repeat CBC.  Patient does not wish to return to the cancer center for any more lab work.  Encourage patient to follow-up with PCP with lab work with he will not come back to see [place].  Will send a copy of this note to PCP.  Greater than 50% was spent in counseling and coordination of care with this patient including but not limited to discussion of the relevant topics above (See A&P) including, but not limited to diagnosis and management of acute and chronic medical conditions.   All questions were answered.  This note was electronically signed.  Jacquelin Hawking, NP 07/26/2017

## 2017-07-25 NOTE — Patient Instructions (Signed)
[facility] Discharge Instructions  RECOMMENDATIONS MADE BY THE CONSULTANT AND ANY TEST RESULTS WILL BE SENT TO YOUR REFERRING PHYSICIAN.  You were seen today by Jenny Burns, NP  Thank you for choosing [facility] at [facility] to provide your oncology and hematology care.  To afford each patient quality time with our provider, please arrive at least 15 minutes before your scheduled appointment time.    If you have a lab appointment with the Cancer Center please come in thru the  Main Entrance and check in at the main information desk  You need to re-schedule your appointment should you arrive 10 or more minutes late.  We strive to give you quality time with our providers, and arriving late affects you and other patients whose appointments are after yours.  Also, if you no show three or more times for appointments you may be dismissed from the clinic at the providers discretion.     Again, thank you for choosing [facility].  Our hope is that these requests will decrease the amount of time that you wait before being seen by our physicians.       _____________________________________________________________  Should you have questions after your visit to  Cancer Center, please contact our office at [phone] between the hours of 8:30 a.m. and 4:30 p.m.  Voicemails left after 4:30 p.m. will not be returned until the following business day.  For prescription refill requests, have your pharmacy contact our office.       Resources For Cancer Patients and their Caregivers ? American Cancer Society: Can assist with transportation, wigs, general needs, runs Look Good Feel Better.        1-888-227-6333 ? Cancer Care: Provides financial assistance, online support groups, medication/co-pay assistance.  1-800-813-HOPE (4673) ? [facility] Assists [place] cancer patients and their  families through emotional , educational and financial support.  336-427-4357 ? [place] DSS Where to apply for food stamps, Medicaid and utility assistance. 336-342-1394 ? [facility]: Transportation to medical appointments. 336-347-2287 ? Social Security Administration: May apply for disability if have a Stage IV cancer. 336-342-7796 1-800-772-1213 ? [place] Aging, Disability and Transit Services: Assists with nutrition, care and transit needs. 336-349-2343  Cancer Center Support Programs: @10RELATIVEDAYS@ > Cancer Support Group  2nd Tuesday of the month 1pm-2pm, Journey Room  > Creative Journey  3rd Tuesday of the month 1130am-1pm, Journey Room  > Look Good Feel Better  1st Wednesday of the month 10am-12 noon, Journey Room (Call American Cancer Society to register 1-800-395-5775)    

## 2017-08-01 ENCOUNTER — Other Ambulatory Visit: Payer: Self-pay | Admitting: Internal Medicine

## 2017-08-31 DIAGNOSIS — M722 Plantar fascial fibromatosis: Secondary | ICD-10-CM | POA: Diagnosis not present

## 2017-08-31 DIAGNOSIS — M79672 Pain in left foot: Secondary | ICD-10-CM | POA: Diagnosis not present

## 2017-08-31 DIAGNOSIS — M2012 Hallux valgus (acquired), left foot: Secondary | ICD-10-CM | POA: Diagnosis not present

## 2017-09-04 DIAGNOSIS — I739 Peripheral vascular disease, unspecified: Secondary | ICD-10-CM | POA: Diagnosis not present

## 2017-09-04 DIAGNOSIS — M79675 Pain in left toe(s): Secondary | ICD-10-CM | POA: Diagnosis not present

## 2017-09-04 DIAGNOSIS — I771 Stricture of artery: Secondary | ICD-10-CM | POA: Diagnosis not present

## 2017-09-04 DIAGNOSIS — M7989 Other specified soft tissue disorders: Secondary | ICD-10-CM | POA: Diagnosis not present

## 2017-09-07 DIAGNOSIS — I739 Peripheral vascular disease, unspecified: Secondary | ICD-10-CM | POA: Diagnosis not present

## 2017-09-07 DIAGNOSIS — M79672 Pain in left foot: Secondary | ICD-10-CM | POA: Diagnosis not present

## 2017-09-07 DIAGNOSIS — M79671 Pain in right foot: Secondary | ICD-10-CM | POA: Diagnosis not present

## 2017-09-22 ENCOUNTER — Other Ambulatory Visit: Payer: Self-pay | Admitting: Gastroenterology

## 2017-10-17 NOTE — Progress Notes (Signed)
Established Carotid Patient New Critical Limb Ischemia   History of Present Illness   Peter [name] is a 84 y.o. ([birth year]) male who presents with chief complaint: left leg pain.  Shortly after discharge from his L CEA, this patient began developing sharp pain in L foot, 1-5/10 intensity, associate with walking and rest.  Over the last few weeks, left foot pain is improved at rest and no longer wakes him from sleep.  The patient's L foot sx worsen with ambulation.  He was sent for outside BLE physiologic studies which demonstrated: R ABI 0.41, L ABI 0.28.  The patient denies any wounds or gangrene at this point.  He still has sensation and motor intact.  Additionally, pt is s/p L CEA (03/20/17) for sub-total occlusion of L carotid artery.  Previous carotid studies demonstrated: RICA 86-76% stenosis, LICA 19-50% stenosis.  Patient has no history of TIA or stroke symptom.  The patient has never had amaurosis fugax or monocular blindness.  The patient has never had facial drooping or hemiplegia.  The patient has never had receptive or expressive aphasia.    Past Medical History:  Diagnosis Date  . Anxiety   . Arthritis   . BPH (benign prostatic hyperplasia)   . Chronic pain   . Complication of anesthesia    had an anxiety attack once  . Depression   . GERD (gastroesophageal reflux disease)   . Hiatal hernia    small  . HTN (hypertension)   . Internal carotid artery stenosis, left   . Prostate disease    h/o elevated PSA, neg bx, [facility]  . Sciatica   . Wears glasses   . Wears partial dentures     Past Surgical History:  Procedure Laterality Date  . BIOPSY  01/29/2014   Procedure: BIOPSY;  Surgeon: Daneil Dolin, MD;  [facility];  Service: Endoscopy;;  Gastric and Esophageal  . CAROTID ENDARTERECTOMY Left 03/20/2017  . CATARACT EXTRACTION W/PHACO  05/21/2012   Procedure: CATARACT EXTRACTION PHACO AND INTRAOCULAR LENS PLACEMENT (IOC);  Surgeon: Tonny Branch, MD;  Location: [facility];  Service: Ophthalmology;  Laterality: Right;  CDE 19.34  . COLONOSCOPY  12/2004   diverticulosis, hyperplastic polyps, hemorrhoids  . ENDARTERECTOMY Left 03/20/2017   Procedure: ENDARTERECTOMY CAROTID-LEFT;  Surgeon: Conrad Coinjock, MD;  Location: [place];  Service: Vascular;  Laterality: Left;  . ESOPHAGOGASTRODUODENOSCOPY  2003   schatzki ring, small vascular abnormality of duodenal bulb ?Dieulafoy's lesion (ablated)  . ESOPHAGOGASTRODUODENOSCOPY  11/10/10   Dr. Gala Romney dilation 2 rings with 18F  . ESOPHAGOGASTRODUODENOSCOPY N/A 01/29/2014   Dr.Rourk- multiple esophageal rings/webs- s/p dialation. small hiatal hernia. abnormal gastric mucosa. bx= mild chronic inactive gastritis (oxyntic mucosa), squamous mucosa with epithelial changes consistent with reflux related injury.  Marland Kitchen MULTIPLE TOOTH EXTRACTIONS    . PATCH ANGIOPLASTY Left 03/20/2017  . PATCH ANGIOPLASTY Left 03/20/2017   Procedure: PATCH ANGIOPLASTY;  Surgeon: Conrad [place], MD;  Location: [facility] OR;  Service: Vascular;  Laterality: Left;    Social History   Socioeconomic History  . Marital status: Married    Spouse name: Not on file  . Number of children: 1  . Years of education: Not on file  . Highest education level: Not on file  Occupational History  . Occupation: retired    Comment: Orthoptist  Social Needs  . Financial resource strain: Not on file  . Food insecurity:    Worry: Not on file    Inability:  Not on file  . Transportation needs:    Medical: Not on file    Non-medical: Not on file  Tobacco Use  . Smoking status: Former Smoker    Packs/day: 1.00    Years: 60.00    Pack years: 60.00    Types: Cigarettes    Last attempt to quit: 03/18/2017    Years since quitting: 0.5  . Smokeless tobacco: Never Used  Substance and Sexual Activity  . Alcohol use: No    Comment: quit 10  years ago  . Drug use: No  . Sexual activity: Not Currently    Partners: Female    Birth control/protection: None     Comment: spouse, has to use viagra  Lifestyle  . Physical activity:    Days per week: Not on file    Minutes per session: Not on file  . Stress: Not on file  Relationships  . Social connections:    Talks on phone: Not on file    Gets together: Not on file    Attends religious service: Not on file    Active member of club or organization: Not on file    Attends meetings of clubs or organizations: Not on file    Relationship status: Not on file  . Intimate partner violence:    Fear of current or ex partner: Not on file    Emotionally abused: Not on file    Physically abused: Not on file    Forced sexual activity: Not on file  Other Topics Concern  . Not on file  Social History Narrative  . Not on file    Family History  Problem Relation Age of Onset  . Stroke Father 69       deceased  . Heart attack Mother   . Colon cancer Neg Hx     Current Outpatient Medications  Medication Sig Dispense Refill  . ALPRAZolam (XANAX) 0.5 MG tablet Take 0.5 mg by mouth at bedtime.     Marland Kitchen amLODipine (NORVASC) 5 MG tablet Take 5 mg by mouth daily.     Marland Kitchen aspirin EC 81 MG tablet Take 81 mg by mouth daily.     Marland Kitchen atorvastatin (LIPITOR) 40 MG tablet     . ENULOSE 10 GM/15ML SOLN TAKE TWO TABLESPOONFULS AT BEDTIME AS NEEDED. 946 mL 0  . finasteride (PROSCAR) 5 MG tablet Take 5 mg by mouth daily with lunch.     Marland Kitchen HYDROcodone-acetaminophen (NORCO/VICODIN) 5-325 MG per tablet Take 1 tablet by mouth daily as needed for moderate pain.    Marland Kitchen lisinopril (PRINIVIL,ZESTRIL) 40 MG tablet Take 40 mg by mouth daily.     . pantoprazole (PROTONIX) 40 MG tablet Take 1 tablet (40 mg total) by mouth daily before breakfast. 30 tablet 11  . atorvastatin (LIPITOR) 40 MG tablet Take 1 tablet (40 mg total) by mouth daily. 30 tablet 11  . lactulose (CHRONULAC) 10 GM/15ML solution TAKE TWO TABLESPOONFULS AT BEDTIME AS NEEDED. (Patient not taking: Reported on 10/18/2017) 946 mL 1  . mirtazapine (REMERON) 15 MG tablet TAKE (1)  TABLET BY MOUTH AT BEDTIME. (Patient not taking: Reported on 10/18/2017) 30 tablet 3   No current facility-administered medications for this visit.    Facility-Administered Medications Ordered in Other Visits  Medication Dose Route Frequency Provider Last Rate Last Dose  . shugarcaine ophthalmic solution    PRN Tonny Branch, MD         Allergies  Allergen Reactions  . Penicillins Rash  Has patient had a PCN reaction causing immediate rash, facial/tongue/throat swelling, SOB or lightheadedness with hypotension: Yes Has patient had a PCN reaction causing severe rash involving mucus membranes or skin necrosis: No Has patient had a PCN reaction that required hospitalization: No Has patient had a PCN reaction occurring within the last 10 years: No If all of the above answers are "NO", then may proceed with Cephalosporin use.     REVIEW OF SYSTEMS (negative unless checked):   Cardiac:  []  Chest pain or chest pressure? [x]  Shortness of breath upon activity? []  Shortness of breath when lying flat? []  Irregular heart rhythm?  Vascular:  [x]  Pain in calf, thigh, or hip brought on by walking? [x]  Pain in feet at night that wakes you up from your sleep? []  Blood clot in your veins? []  Leg swelling?  Pulmonary:  []  Oxygen at home? []  Productive cough? []  Wheezing?  Neurologic:  []  Sudden weakness in arms or legs? []  Sudden numbness in arms or legs? []  Sudden onset of difficult speaking or slurred speech? []  Temporary loss of vision in one eye? []  Problems with dizziness?  Gastrointestinal:  []  Blood in stool? []  Vomited blood?  Genitourinary:  []  Burning when urinating? []  Blood in urine?  Psychiatric:  []  Major depression  Hematologic:  []  Bleeding problems? []  Problems with blood clotting?  Dermatologic:  []  Rashes or ulcers?  Constitutional:  []  Fever or chills?  Ear/Nose/Throat:  []  Change in hearing? []  Nose bleeds? []  Sore throat?  Musculoskeletal:    []  Back pain? []  Joint pain? []  Muscle pain?   Physical Examination   Vitals:   10/18/17 1304  BP: 130/65  Pulse: 78  Resp: 16  Temp: (!) 97 F (36.1 C)  TempSrc: Oral  SpO2: 96%  Weight: 151 lb 1.6 oz (68.5 kg)  Height: 5\' 10"  (1.778 m)   Body mass index is 21.68 kg/m.  General Alert, O x 3, Cachectic, Elderly  Neck Supple, mid-line trachea,  left incision well healed  Pulmonary Sym exp, good B air movt, CTA B  Cardiac RRR, Nl S1, S2, no Murmurs, No rubs, No S3,S4  Vascular Vessel Right Left  Radial Palpable Palpable  Brachial Palpable Palpable  Carotid Palpable, No Bruit Palpable, No Bruit  Aorta Not palpable N/A  Femoral Palpable Palpable  Popliteal Not palpable Not palpable  PT Not palpable Not palpable  DP Not palpable Not palpable    Gastro- intestinal soft, non-distended, non-tender to palpation, No guarding or rebound, no HSM, no masses, no CVAT B, No palpable prominent aortic pulse,    Musculo- skeletal M/S 5/5 throughout  , Extremities without ischemic changes, unsteady gait  Neurologic Cranial nerves 2-12 intact, Pain and light touch intact in extremities, Motor exam as listed above    Non-Invasive Vascular Imaging   Outside BLE Physiologic Studies (09/27/17)  R:   ABI: 0.41  CFA and SFA: Tri  Pop, PT and DP: bi  TBI: 0.46  L:  ABI 0.28  CFA, SFA and pop: bi  PT and DP: mono   Medical Decision Making   Peter [name] is a 84 y.o. male who presents with: s/p L CEA for asx ICA stenosis >90%, asx R ICA stenosis 70-79%, LLE critical limb ischemia, RLE asx mod PAD   Physiologic studies suggest possible inflow issues in both iliac arterial system.    On the left, there is obviously tibial disease also.  Based on the patient's vascular studies and examination, I have offered  the patient: Aortogram, bilateral runoff, and possible left leg intervention. I discussed with the patient the nature of angiographic procedures, especially the  limited patencies of any endovascular intervention.   The patient is aware of that the risks of an angiographic procedure include but are not limited to: bleeding, infection, access site complications, renal failure, embolization, rupture of vessel, dissection, arteriovenous fistula, possible need for emergent surgical intervention, possible need for surgical procedures to treat the patient's pathology, anaphylactic reaction to contrast, and stroke and death.   The patient is aware of the risks and agrees to proceed.  He is scheduled for 18 APR 19.  I discussed in depth with the patient the nature of atherosclerosis, and emphasized the importance of maximal medical management including strict control of blood pressure, blood glucose, and lipid levels, antiplatelet agents, obtaining regular exercise, and cessation of smoking.    The patient is aware that without maximal medical management the underlying atherosclerotic disease process will progress, limiting the benefit of any interventions.  The patient is currently on a statin: Lipitor.   The patient is currently on an anti-platelet: ASA.   The patient was supposed to get a B carotid duplex today also, but will schedule this for his post-angiogram visit.  Thank you for allowing [place] to participate in this patient's care.   [name], MD, FACS [facility] [place] Office: [phone] Pager: [phone]

## 2017-10-17 NOTE — H&P (View-Only) (Signed)
Established Carotid Patient New Critical Limb Ischemia   History of Present Illness   Peter [name] is a 84 y.o. (March 06, 1933) male who presents with chief complaint: left leg pain.  Shortly after discharge from his L CEA, this patient began developing sharp pain in L foot, 1-5/10 intensity, associate with walking and rest.  Over the last few weeks, left foot pain is improved at rest and no longer wakes him from sleep.  The patient's L foot sx worsen with ambulation.  He was sent for outside BLE physiologic studies which demonstrated: R ABI 0.41, L ABI 0.28.  The patient denies any wounds or gangrene at this point.  He still has sensation and motor intact.  Additionally, pt is s/p L CEA (03/20/17) for sub-total occlusion of L carotid artery.  Previous carotid studies demonstrated: RICA 29-52% stenosis, LICA 84-13% stenosis.  Patient has no history of TIA or stroke symptom.  The patient has never had amaurosis fugax or monocular blindness.  The patient has never had facial drooping or hemiplegia.  The patient has never had receptive or expressive aphasia.    Past Medical History:  Diagnosis Date  . Anxiety   . Arthritis   . BPH (benign prostatic hyperplasia)   . Chronic pain   . Complication of anesthesia    had an anxiety attack once  . Depression   . GERD (gastroesophageal reflux disease)   . Hiatal hernia    small  . HTN (hypertension)   . Internal carotid artery stenosis, left   . Prostate disease    h/o elevated PSA, neg bx, [facility]  . Sciatica   . Wears glasses   . Wears partial dentures     Past Surgical History:  Procedure Laterality Date  . BIOPSY  01/29/2014   Procedure: BIOPSY;  Surgeon: Daneil Dolin, MD;  [facility];  Service: Endoscopy;;  Gastric and Esophageal  . CAROTID ENDARTERECTOMY Left 03/20/2017  . CATARACT EXTRACTION W/PHACO  05/21/2012   Procedure: CATARACT EXTRACTION PHACO AND INTRAOCULAR LENS PLACEMENT (IOC);  Surgeon: Tonny Branch, MD;  Location: [facility];  Service: Ophthalmology;  Laterality: Right;  CDE 19.34  . COLONOSCOPY  12/2004   diverticulosis, hyperplastic polyps, hemorrhoids  . ENDARTERECTOMY Left 03/20/2017   Procedure: ENDARTERECTOMY CAROTID-LEFT;  Surgeon: Conrad Animas, MD;  Location: [place];  Service: Vascular;  Laterality: Left;  . ESOPHAGOGASTRODUODENOSCOPY  2003   schatzki ring, small vascular abnormality of duodenal bulb ?Dieulafoy's lesion (ablated)  . ESOPHAGOGASTRODUODENOSCOPY  11/10/10   Dr. Gala Romney dilation 2 rings with 73F  . ESOPHAGOGASTRODUODENOSCOPY N/A 01/29/2014   Dr.Rourk- multiple esophageal rings/webs- s/p dialation. small hiatal hernia. abnormal gastric mucosa. bx= mild chronic inactive gastritis (oxyntic mucosa), squamous mucosa with epithelial changes consistent with reflux related injury.  Marland Kitchen MULTIPLE TOOTH EXTRACTIONS    . PATCH ANGIOPLASTY Left 03/20/2017  . PATCH ANGIOPLASTY Left 03/20/2017   Procedure: PATCH ANGIOPLASTY;  Surgeon: Conrad La [place]ita, MD;  Location: [facility] OR;  Service: Vascular;  Laterality: Left;    Social History   Socioeconomic History  . Marital status: Married    Spouse name: Not on file  . Number of children: 1  . Years of education: Not on file  . Highest education level: Not on file  Occupational History  . Occupation: retired    Comment: Orthoptist  Social Needs  . Financial resource strain: Not on file  . Food insecurity:    Worry: Not on file    Inability:  Not on file  . Transportation needs:    Medical: Not on file    Non-medical: Not on file  Tobacco Use  . Smoking status: Former Smoker    Packs/day: 1.00    Years: 60.00    Pack years: 60.00    Types: Cigarettes    Last attempt to quit: 03/18/2017    Years since quitting: 0.5  . Smokeless tobacco: Never Used  Substance and Sexual Activity  . Alcohol use: No    Comment: quit 10  years ago  . Drug use: No  . Sexual activity: Not Currently    Partners: Female    Birth control/protection: None     Comment: spouse, has to use viagra  Lifestyle  . Physical activity:    Days per week: Not on file    Minutes per session: Not on file  . Stress: Not on file  Relationships  . Social connections:    Talks on phone: Not on file    Gets together: Not on file    Attends religious service: Not on file    Active member of club or organization: Not on file    Attends meetings of clubs or organizations: Not on file    Relationship status: Not on file  . Intimate partner violence:    Fear of current or ex partner: Not on file    Emotionally abused: Not on file    Physically abused: Not on file    Forced sexual activity: Not on file  Other Topics Concern  . Not on file  Social History Narrative  . Not on file    Family History  Problem Relation Age of Onset  . Stroke Father 4       deceased  . Heart attack Mother   . Colon cancer Neg Hx     Current Outpatient Medications  Medication Sig Dispense Refill  . ALPRAZolam (XANAX) 0.5 MG tablet Take 0.5 mg by mouth at bedtime.     Marland Kitchen amLODipine (NORVASC) 5 MG tablet Take 5 mg by mouth daily.     Marland Kitchen aspirin EC 81 MG tablet Take 81 mg by mouth daily.     Marland Kitchen atorvastatin (LIPITOR) 40 MG tablet     . ENULOSE 10 GM/15ML SOLN TAKE TWO TABLESPOONFULS AT BEDTIME AS NEEDED. 946 mL 0  . finasteride (PROSCAR) 5 MG tablet Take 5 mg by mouth daily with lunch.     Marland Kitchen HYDROcodone-acetaminophen (NORCO/VICODIN) 5-325 MG per tablet Take 1 tablet by mouth daily as needed for moderate pain.    Marland Kitchen lisinopril (PRINIVIL,ZESTRIL) 40 MG tablet Take 40 mg by mouth daily.     . pantoprazole (PROTONIX) 40 MG tablet Take 1 tablet (40 mg total) by mouth daily before breakfast. 30 tablet 11  . atorvastatin (LIPITOR) 40 MG tablet Take 1 tablet (40 mg total) by mouth daily. 30 tablet 11  . lactulose (CHRONULAC) 10 GM/15ML solution TAKE TWO TABLESPOONFULS AT BEDTIME AS NEEDED. (Patient not taking: Reported on 10/18/2017) 946 mL 1  . mirtazapine (REMERON) 15 MG tablet TAKE (1)  TABLET BY MOUTH AT BEDTIME. (Patient not taking: Reported on 10/18/2017) 30 tablet 3   No current facility-administered medications for this visit.    Facility-Administered Medications Ordered in Other Visits  Medication Dose Route Frequency Provider Last Rate Last Dose  . shugarcaine ophthalmic solution    PRN Tonny Branch, MD         Allergies  Allergen Reactions  . Penicillins Rash  Has patient had a PCN reaction causing immediate rash, facial/tongue/throat swelling, SOB or lightheadedness with hypotension: Yes Has patient had a PCN reaction causing severe rash involving mucus membranes or skin necrosis: No Has patient had a PCN reaction that required hospitalization: No Has patient had a PCN reaction occurring within the last 10 years: No If all of the above answers are "NO", then may proceed with Cephalosporin use.     REVIEW OF SYSTEMS (negative unless checked):   Cardiac:  []  Chest pain or chest pressure? [x]  Shortness of breath upon activity? []  Shortness of breath when lying flat? []  Irregular heart rhythm?  Vascular:  [x]  Pain in calf, thigh, or hip brought on by walking? [x]  Pain in feet at night that wakes you up from your sleep? []  Blood clot in your veins? []  Leg swelling?  Pulmonary:  []  Oxygen at home? []  Productive cough? []  Wheezing?  Neurologic:  []  Sudden weakness in arms or legs? []  Sudden numbness in arms or legs? []  Sudden onset of difficult speaking or slurred speech? []  Temporary loss of vision in one eye? []  Problems with dizziness?  Gastrointestinal:  []  Blood in stool? []  Vomited blood?  Genitourinary:  []  Burning when urinating? []  Blood in urine?  Psychiatric:  []  Major depression  Hematologic:  []  Bleeding problems? []  Problems with blood clotting?  Dermatologic:  []  Rashes or ulcers?  Constitutional:  []  Fever or chills?  Ear/Nose/Throat:  []  Change in hearing? []  Nose bleeds? []  Sore throat?  Musculoskeletal:    []  Back pain? []  Joint pain? []  Muscle pain?   Physical Examination   Vitals:   10/18/17 1304  BP: 130/65  Pulse: 78  Resp: 16  Temp: (!) 97 F (36.1 C)  TempSrc: Oral  SpO2: 96%  Weight: 151 lb 1.6 oz (68.5 kg)  Height: 5\' 10"  (1.778 m)   Body mass index is 21.68 kg/m.  General Alert, O x 3, Cachectic, Elderly  Neck Supple, mid-line trachea,  left incision well healed  Pulmonary Sym exp, good B air movt, CTA B  Cardiac RRR, Nl S1, S2, no Murmurs, No rubs, No S3,S4  Vascular Vessel Right Left  Radial Palpable Palpable  Brachial Palpable Palpable  Carotid Palpable, No Bruit Palpable, No Bruit  Aorta Not palpable N/A  Femoral Palpable Palpable  Popliteal Not palpable Not palpable  PT Not palpable Not palpable  DP Not palpable Not palpable    Gastro- intestinal soft, non-distended, non-tender to palpation, No guarding or rebound, no HSM, no masses, no CVAT B, No palpable prominent aortic pulse,    Musculo- skeletal M/S 5/5 throughout  , Extremities without ischemic changes, unsteady gait  Neurologic Cranial nerves 2-12 intact, Pain and light touch intact in extremities, Motor exam as listed above    Non-Invasive Vascular Imaging   Outside BLE Physiologic Studies (09/27/17)  R:   ABI: 0.41  CFA and SFA: Tri  Pop, PT and DP: bi  TBI: 0.46  L:  ABI 0.28  CFA, SFA and pop: bi  PT and DP: mono   Medical Decision Making   Peter [name] is a 84 y.o. male who presents with: s/p L CEA for asx ICA stenosis >90%, asx R ICA stenosis 70-79%, LLE critical limb ischemia, RLE asx mod PAD   Physiologic studies suggest possible inflow issues in both iliac arterial system.    On the left, there is obviously tibial disease also.  Based on the patient's vascular studies and examination, I have offered  the patient: Aortogram, bilateral runoff, and possible left leg intervention. I discussed with the patient the nature of angiographic procedures, especially the  limited patencies of any endovascular intervention.   The patient is aware of that the risks of an angiographic procedure include but are not limited to: bleeding, infection, access site complications, renal failure, embolization, rupture of vessel, dissection, arteriovenous fistula, possible need for emergent surgical intervention, possible need for surgical procedures to treat the patient's pathology, anaphylactic reaction to contrast, and stroke and death.   The patient is aware of the risks and agrees to proceed.  He is scheduled for 18 APR 19.  I discussed in depth with the patient the nature of atherosclerosis, and emphasized the importance of maximal medical management including strict control of blood pressure, blood glucose, and lipid levels, antiplatelet agents, obtaining regular exercise, and cessation of smoking.    The patient is aware that without maximal medical management the underlying atherosclerotic disease process will progress, limiting the benefit of any interventions.  The patient is currently on a statin: Lipitor.   The patient is currently on an anti-platelet: ASA.   The patient was supposed to get a B carotid duplex today also, but will schedule this for his post-angiogram visit.  Thank you for allowing [place] to participate in this patient's care.   [name], MD, FACS [facility] [place] Office: [phone] Pager: [phone]

## 2017-10-18 ENCOUNTER — Encounter: Payer: Self-pay | Admitting: Vascular Surgery

## 2017-10-18 ENCOUNTER — Encounter: Payer: Self-pay | Admitting: *Deleted

## 2017-10-18 ENCOUNTER — Other Ambulatory Visit: Payer: Self-pay | Admitting: *Deleted

## 2017-10-18 ENCOUNTER — Ambulatory Visit: Payer: Medicare Other | Admitting: Vascular Surgery

## 2017-10-18 VITALS — BP 130/65 | HR 78 | Temp 97.0°F | Resp 16 | Ht 70.0 in | Wt 151.1 lb

## 2017-10-18 DIAGNOSIS — I6523 Occlusion and stenosis of bilateral carotid arteries: Secondary | ICD-10-CM | POA: Diagnosis not present

## 2017-10-18 DIAGNOSIS — I70229 Atherosclerosis of native arteries of extremities with rest pain, unspecified extremity: Secondary | ICD-10-CM | POA: Insufficient documentation

## 2017-10-18 DIAGNOSIS — I70222 Atherosclerosis of native arteries of extremities with rest pain, left leg: Secondary | ICD-10-CM

## 2017-10-19 ENCOUNTER — Encounter: Payer: Self-pay | Admitting: Podiatry

## 2017-10-26 ENCOUNTER — Ambulatory Visit (HOSPITAL_COMMUNITY)
Admission: RE | Disposition: A | Discharge status: Home / Self Care | Payer: Self-pay | Source: Ambulatory Visit | Attending: Vascular Surgery

## 2017-10-26 ENCOUNTER — Ambulatory Visit (HOSPITAL_COMMUNITY)
Admission: RE | Admit: 2017-10-26 | Discharge: 2017-10-26 | Disposition: A | Discharge status: Home / Self Care | Payer: Medicare Other | Source: Ambulatory Visit | Attending: Vascular Surgery | Admitting: Vascular Surgery

## 2017-10-26 DIAGNOSIS — Z7982 Long term (current) use of aspirin: Secondary | ICD-10-CM | POA: Diagnosis not present

## 2017-10-26 DIAGNOSIS — K219 Gastro-esophageal reflux disease without esophagitis: Secondary | ICD-10-CM | POA: Insufficient documentation

## 2017-10-26 DIAGNOSIS — F419 Anxiety disorder, unspecified: Secondary | ICD-10-CM | POA: Insufficient documentation

## 2017-10-26 DIAGNOSIS — Z8249 Family history of ischemic heart disease and other diseases of the circulatory system: Secondary | ICD-10-CM | POA: Diagnosis not present

## 2017-10-26 DIAGNOSIS — I6522 Occlusion and stenosis of left carotid artery: Secondary | ICD-10-CM | POA: Diagnosis not present

## 2017-10-26 DIAGNOSIS — M1990 Unspecified osteoarthritis, unspecified site: Secondary | ICD-10-CM | POA: Insufficient documentation

## 2017-10-26 DIAGNOSIS — Z823 Family history of stroke: Secondary | ICD-10-CM | POA: Diagnosis not present

## 2017-10-26 DIAGNOSIS — Z87891 Personal history of nicotine dependence: Secondary | ICD-10-CM | POA: Diagnosis not present

## 2017-10-26 DIAGNOSIS — Z79899 Other long term (current) drug therapy: Secondary | ICD-10-CM | POA: Insufficient documentation

## 2017-10-26 DIAGNOSIS — Z880 Allergy status to penicillin: Secondary | ICD-10-CM | POA: Insufficient documentation

## 2017-10-26 DIAGNOSIS — Z9842 Cataract extraction status, left eye: Secondary | ICD-10-CM | POA: Diagnosis not present

## 2017-10-26 DIAGNOSIS — Z9889 Other specified postprocedural states: Secondary | ICD-10-CM | POA: Insufficient documentation

## 2017-10-26 DIAGNOSIS — N400 Enlarged prostate without lower urinary tract symptoms: Secondary | ICD-10-CM | POA: Insufficient documentation

## 2017-10-26 DIAGNOSIS — I10 Essential (primary) hypertension: Secondary | ICD-10-CM | POA: Insufficient documentation

## 2017-10-26 DIAGNOSIS — I998 Other disorder of circulatory system: Secondary | ICD-10-CM | POA: Diagnosis not present

## 2017-10-26 DIAGNOSIS — F329 Major depressive disorder, single episode, unspecified: Secondary | ICD-10-CM | POA: Diagnosis not present

## 2017-10-26 DIAGNOSIS — I70229 Atherosclerosis of native arteries of extremities with rest pain, unspecified extremity: Secondary | ICD-10-CM | POA: Diagnosis present

## 2017-10-26 HISTORY — PX: ABDOMINAL AORTOGRAM W/LOWER EXTREMITY: CATH118223

## 2017-10-26 LAB — POCT I-STAT, CHEM 8
BUN: 3 mg/dL — ABNORMAL LOW (ref 6–20)
Calcium, Ion: 1.11 mmol/L — ABNORMAL LOW (ref 1.15–1.40)
Chloride: 104 mmol/L (ref 101–111)
Creatinine, Ser: 0.5 mg/dL — ABNORMAL LOW (ref 0.61–1.24)
Glucose, Bld: 91 mg/dL (ref 65–99)
HCT: 33 % — ABNORMAL LOW (ref 39.0–52.0)
Hemoglobin: 11.2 g/dL — ABNORMAL LOW (ref 13.0–17.0)
Potassium: 3.3 mmol/L — ABNORMAL LOW (ref 3.5–5.1)
Sodium: 139 mmol/L (ref 135–145)
TCO2: 22 mmol/L (ref 22–32)

## 2017-10-26 LAB — POCT ACTIVATED CLOTTING TIME
Activated Clotting Time: 175 seconds
Activated Clotting Time: 224 seconds

## 2017-10-26 SURGERY — ABDOMINAL AORTOGRAM W/LOWER EXTREMITY
Anesthesia: LOCAL

## 2017-10-26 MED ORDER — HEPARIN (PORCINE) IN NACL 1000-0.9 UT/500ML-% IV SOLN
INTRAVENOUS | Status: AC
  Filled 2017-10-26: qty 1000

## 2017-10-26 MED ORDER — SODIUM CHLORIDE 0.9 % IV SOLN: 250.0000 mL | INTRAVENOUS | Status: DC | PRN

## 2017-10-26 MED ORDER — LABETALOL HCL 5 MG/ML IV SOLN: 10.0000 mg | INTRAVENOUS | Status: DC | PRN

## 2017-10-26 MED ORDER — HEPARIN (PORCINE) IN NACL 2-0.9 UNITS/ML
INTRAMUSCULAR | Status: AC | PRN
  Administered 2017-10-26: 500 mL

## 2017-10-26 MED ORDER — ONDANSETRON HCL 4 MG/2ML IJ SOLN: 4.0000 mg | Freq: Four times a day (QID) | INTRAMUSCULAR | Status: DC | PRN

## 2017-10-26 MED ORDER — SODIUM CHLORIDE 0.9 % IV SOLN
INTRAVENOUS | Status: DC
  Administered 2017-10-26: 08:00:00 via INTRAVENOUS

## 2017-10-26 MED ORDER — HEPARIN SODIUM (PORCINE) 1000 UNIT/ML IJ SOLN
INTRAMUSCULAR | Status: DC | PRN
  Administered 2017-10-26: 3000 [IU] via INTRAVENOUS

## 2017-10-26 MED ORDER — HEPARIN (PORCINE) IN NACL 1000-0.9 UT/500ML-% IV SOLN
INTRAVENOUS | Status: AC
  Filled 2017-10-26: qty 500

## 2017-10-26 MED ORDER — SODIUM CHLORIDE 0.9% FLUSH: 3.0000 mL | Freq: Two times a day (BID) | INTRAVENOUS | Status: DC

## 2017-10-26 MED ORDER — SODIUM CHLORIDE 0.9% FLUSH: 3.0000 mL | INTRAVENOUS | Status: DC | PRN

## 2017-10-26 MED ORDER — FENTANYL CITRATE (PF) 100 MCG/2ML IJ SOLN
INTRAMUSCULAR | Status: AC
Start: 2017-10-26 — End: ?
  Filled 2017-10-26: qty 2

## 2017-10-26 MED ORDER — LIDOCAINE HCL (PF) 1 % IJ SOLN
INTRAMUSCULAR | Status: DC | PRN
  Administered 2017-10-26: 13 mL
  Administered 2017-10-26: 3 mL
  Administered 2017-10-26: 13 mL

## 2017-10-26 MED ORDER — IODIXANOL 320 MG/ML IV SOLN
INTRAVENOUS | Status: DC | PRN
  Administered 2017-10-26: 150 mL via INTRA_ARTERIAL

## 2017-10-26 MED ORDER — NITROGLYCERIN 1 MG/10 ML FOR IR/CATH LAB
INTRA_ARTERIAL | Status: AC
  Filled 2017-10-26: qty 10

## 2017-10-26 MED ORDER — HEPARIN SODIUM (PORCINE) 1000 UNIT/ML IJ SOLN
INTRAMUSCULAR | Status: AC
  Filled 2017-10-26: qty 1

## 2017-10-26 MED ORDER — SODIUM CHLORIDE 0.9 % WEIGHT BASED INFUSION: 1.0000 mL/kg/h | INTRAVENOUS | Status: DC

## 2017-10-26 MED ORDER — NITROGLYCERIN 1 MG/10 ML FOR IR/CATH LAB
INTRA_ARTERIAL | Status: DC | PRN
  Administered 2017-10-26: 200 ug via INTRA_ARTERIAL

## 2017-10-26 MED ORDER — ACETAMINOPHEN 325 MG PO TABS: 650.0000 mg | ORAL_TABLET | ORAL | Status: DC | PRN

## 2017-10-26 MED ORDER — HYDRALAZINE HCL 20 MG/ML IJ SOLN: 5.0000 mg | INTRAMUSCULAR | Status: DC | PRN

## 2017-10-26 MED ORDER — FENTANYL CITRATE (PF) 100 MCG/2ML IJ SOLN
INTRAMUSCULAR | Status: DC | PRN
  Administered 2017-10-26: 50 ug via INTRAVENOUS

## 2017-10-26 MED ORDER — LIDOCAINE HCL (PF) 1 % IJ SOLN
INTRAMUSCULAR | Status: AC
  Filled 2017-10-26: qty 30

## 2017-10-26 SURGICAL SUPPLY — 14 items
CATH ANGIO 5F PIGTAIL 100CM (CATHETERS) ×1 IMPLANT
COVER PRB 48X5XTLSCP FOLD TPE (BAG) IMPLANT
COVER PROBE 5X48 (BAG) ×2
DEVICE CONTINUOUS FLUSH (MISCELLANEOUS) ×1 IMPLANT
KIT MICROPUNCTURE NIT STIFF (SHEATH) ×3 IMPLANT
KIT PV (KITS) ×2 IMPLANT
SHEATH AVANTI 11CM 5FR (SHEATH) ×1 IMPLANT
STOPCOCK MORSE 400PSI 3WAY (MISCELLANEOUS) ×1 IMPLANT
SYR MEDRAD MARK V 150ML (SYRINGE) ×2 IMPLANT
TRANSDUCER W/STOPCOCK (MISCELLANEOUS) ×2 IMPLANT
TRAY PV CATH (CUSTOM PROCEDURE TRAY) ×2 IMPLANT
TUBING HIGH PRESSURE 120CM (CONNECTOR) ×1 IMPLANT
WIRE BENTSON .035X145CM (WIRE) ×1 IMPLANT
WIRE TORQFLEX AUST .018X40CM (WIRE) ×1 IMPLANT

## 2017-10-26 NOTE — Progress Notes (Signed)
Site area: left brachial arterial sheath pulled and pressure held by Alison Murray Site Prior to Removal:  Level 0 Pressure Applied For:  30 minutes Manual:   yes Patient Status During Pull:  stable Post Pull Site:  Level  0 Post Pull Instructions Given:  yes Post Pull Pulses Present: left radial and brachial pulses palpable Dressing Applied:  Gauze and tegaderm Bedrest begins @ 2591 Comments:

## 2017-10-26 NOTE — Progress Notes (Signed)
Left 37F brachial arterial sheath pulled and pressure being held to site by Alison Murray

## 2017-10-26 NOTE — Discharge Instructions (Signed)
BRACHIAL  Site Care Refer to this sheet in the next few weeks. These instructions provide you with information about caring for yourself after your procedure. Your health care provider may also give you more specific instructions. Your treatment has been planned according to current medical practices, but problems sometimes occur. Call your health care provider if you have any problems or questions after your procedure. What can I expect after the procedure? After your procedure, it is typical to have the following:  Bruising at the radial site that usually fades within 1-2 weeks.  Blood collecting in the tissue (hematoma) that may be painful to the touch. It should usually decrease in size and tenderness within 1-2 weeks.  Follow these instructions at home:  Take medicines only as directed by your health care provider.  You may shower 24-48 hours after the procedure or as directed by your health care provider. Remove the bandage (dressing) and gently wash the site with plain soap and water. Pat the area dry with a clean towel. Do not rub the site, because this may cause bleeding.  Do not take baths, swim, or use a hot tub until your health care provider approves.  Check your insertion site every day for redness, swelling, or drainage.  Do not apply powder or lotion to the site.  Do not flex or bend the affected arm for 24 hours or as directed by your health care provider.  Do not push or pull heavy objects with the affected arm for 24 hours or as directed by your health care provider.  Do not lift over 10 lb (4.5 kg) for 5 days after your procedure or as directed by your health care provider.  Ask your health care provider when it is okay to: ? Return to work or school. ? Resume usual physical activities or sports. ? Resume sexual activity.  Do not drive home if you are discharged the same day as the procedure. Have someone else drive you.  You may drive 24 hours after the  procedure unless otherwise instructed by your health care provider.  Do not operate machinery or power tools for 24 hours after the procedure.  If your procedure was done as an outpatient procedure, which means that you went home the same day as your procedure, a responsible adult should be with you for the first 24 hours after you arrive home.  Keep all follow-up visits as directed by your health care provider. This is important. Contact a health care provider if:  You have a fever.  You have chills.  You have increased bleeding from the radial site. Hold pressure on the site. CALL 911 Get help right away if:  You have unusual pain at the radial site.  You have redness, warmth, or swelling at the radial site.  You have drainage (other than a small amount of blood on the dressing) from the radial site.  The radial site is bleeding, and the bleeding does not stop after 30 minutes of holding steady pressure on the site.  Your arm or hand becomes pale, cool, tingly, or numb. This information is not intended to replace advice given to you by your health care provider. Make sure you discuss any questions you have with your health care provider. Document Released: 07/30/2010 Document Revised: 12/03/2015 Document Reviewed: 01/13/2014 Elsevier Interactive Patient Education  2018 [employer].

## 2017-10-26 NOTE — H&P (Signed)
   History and Physical Update  The patient was interviewed and re-examined.  The patient's previous History and Physical has been reviewed and is unchanged from Dr. Bridgett Larsson assessment. Will plan for aortogram with bilateral runoff.   Adel Neyer C. Donzetta Matters, MD [facility] Office: [phone] Pager: [phone]  10/26/2017, 9:44 AM

## 2017-10-26 NOTE — Interval H&P Note (Signed)
   History and Physical Update  The patient was interviewed and re-examined.  The patient's previous History and Physical has been reviewed and is unchanged from my consult.  There is no change in the plan of care: aortogram, bilateral leg runoff, and possible left leg intervention.   I discussed with the patient the nature of angiographic procedures, especially the limited patencies of any endovascular intervention.    The patient is aware of that the risks of an angiographic procedure include but are not limited to: bleeding, infection, access site complications, renal failure, embolization, rupture of vessel, dissection, arteriovenous fistula, possible need for emergent surgical intervention, possible need for surgical procedures to treat the patient's pathology, anaphylactic reaction to contrast, and stroke and death.    The patient is aware of the risks and agrees to proceed.  [name], MD, FACS [facility] Office: [phone] Pager: [phone]  10/26/2017, 8:36 AM

## 2017-10-26 NOTE — Progress Notes (Signed)
Dr Donzetta Matters was contacted for bedrest orders, no bedrest needed for bilateral groins. Brachial site is the only access site.

## 2017-10-26 NOTE — Op Note (Signed)
    Patient name: Peter [name] MRN: [id number] DOB: Apr 03, 1933 Sex: male  10/26/2017 Pre-operative Diagnosis: Critical bilateral lower extremity ischemia Post-operative diagnosis:  Same Surgeon:  Erlene Quan C. Donzetta Matters, MD Procedure Performed: 1.  Ultrasound-guided cannulation of the right and left common femoral arteries as well as left brachial artery 2.  Aortogram with bilateral lower extremity runoff  Indications: 84 year old male with a history of a left carotid endarterectomy now has bilateral lower extremity rest pain left greater than right and significantly decreased ABIs.  He does not have palpable femoral pulses but he is indicated for angiogram possible intervention.  Findings: The bilateral common femoral arteries as well as right external iliac artery and left external and common iliac arteries are occluded.  The common iliac artery on the right is significantly calcified.  He reconstitutes bilateral profunda femoris arteries which give rise to mid thigh superficial femoral arteries.  His popliteal arteries appear to be patent as do the beginnings of the tibial arteries but we could not get better imaging of the tibials given the lack of contrast that would reach that far down the legs.   Procedure:  The patient was identified in the holding area and taken to room 8.  The patient was then placed supine on the table and prepped and draped in the usual sterile fashion.  A time out was called.  Ultrasound was used to evaluate initially the right common femoral artery which was noted to be heavily calcified.  We are able to get a micropuncture needle with backbleeding and get a wire and sheath in place but angiogram demonstrated contrast extravasation and I could not get a wire pass.  I then pulled the sheath and held pressure for 5 minutes.  Similarly results the left common femoral artery and similarly it was heavily calcified.  I was able to get a needle in place followed by microwire I did  track and placed the sheath.  Retrograde angiogram demonstrated a dissection plane all the way up to the level of the aorta.  With this I removed this and held pressure for another 5 minutes.  At this time we palpated the left radial pulse which was strong and there is a strong brachial pulse 2.  We prepped and draped the left arm and use ultrasound guidance to cannulate the left brachial artery.  We then easily went up through the arch  there is no stenotic disease.  A pigtail was placed in the descending thoracic aorta and aortogram with lower extremity runoff to the level of the knees was performed with the above findings.    Patient will need considered for axillary to by profunda bypass with possible jump graft to the popliteal arteries versus aortobifemoral bypass with similar consideration of jump graft to the popliteal arteries.    He tolerated procedure well without immediate complication.   Contrast: 150cc   Travor Royce C. Donzetta Matters, MD [facility] Office: [phone] Pager: [phone]

## 2017-10-27 ENCOUNTER — Encounter (HOSPITAL_COMMUNITY): Payer: Self-pay | Admitting: Vascular Surgery

## 2017-10-27 MED FILL — Heparin Sod (Porcine)-NaCl IV Soln 1000 Unit/500ML-0.9%: INTRAVENOUS | Qty: 500 | Status: AC

## 2017-10-30 ENCOUNTER — Telehealth: Payer: Self-pay | Admitting: Vascular Surgery

## 2017-10-30 NOTE — Telephone Encounter (Signed)
Sched appt 11/29/17 at 11:00. Pt's ph# not working, lm on daughter's # to inform pt of appt.

## 2017-10-30 NOTE — Telephone Encounter (Signed)
-----   Message from Mena Goes, RN sent at 10/26/2017  4:51 PM EDT ----- Regarding: 2 weeks with Dr. Bridgett Larsson to discuss surgery   ----- Message ----- From: Waynetta Sandy, MD Sent: 10/26/2017  11:31 AM To: [facility] [address]  KURT HOFFMEIER [id number] February 27, 1933  10/26/2017 Pre-operative Diagnosis: Critical bilateral lower extremity ischemia  Surgeon:  Erlene Quan C. Donzetta Matters, MD  Procedure Performed: 1.  Ultrasound-guided cannulation of the right and left common femoral arteries as well as left brachial artery 2.  Aortogram with bilateral lower extremity runoff   F/u Dr. Bridgett Larsson in 2-4 weeks for surgical discussion

## 2017-11-02 ENCOUNTER — Telehealth: Payer: Self-pay | Admitting: *Deleted

## 2017-11-02 NOTE — Telephone Encounter (Signed)
Patient called and stated he broke out with a rash 5 days after Angiogram. He is putting some type of lotion on it that makes it feel better. No other treatment. He states it has improved since Tuesday. Denies any breathing or swallowing problems or swelling.  I instructed him to call his PCP to have them see it and make recommendation for treatment.

## 2017-11-02 NOTE — Telephone Encounter (Signed)
Multiple attempts made to call patient back after he left message on triage phone. Line busy.

## 2017-11-10 ENCOUNTER — Other Ambulatory Visit: Payer: Self-pay | Admitting: Gastroenterology

## 2017-11-10 DIAGNOSIS — Z6822 Body mass index (BMI) 22.0-22.9, adult: Secondary | ICD-10-CM | POA: Diagnosis not present

## 2017-11-10 DIAGNOSIS — I10 Essential (primary) hypertension: Secondary | ICD-10-CM | POA: Diagnosis not present

## 2017-11-10 DIAGNOSIS — L570 Actinic keratosis: Secondary | ICD-10-CM | POA: Diagnosis not present

## 2017-11-10 DIAGNOSIS — G894 Chronic pain syndrome: Secondary | ICD-10-CM | POA: Diagnosis not present

## 2017-11-10 DIAGNOSIS — I739 Peripheral vascular disease, unspecified: Secondary | ICD-10-CM | POA: Diagnosis not present

## 2017-11-22 ENCOUNTER — Ambulatory Visit: Payer: Medicare Other | Admitting: Vascular Surgery

## 2017-11-22 ENCOUNTER — Telehealth: Payer: Self-pay | Admitting: Internal Medicine

## 2017-11-22 ENCOUNTER — Other Ambulatory Visit: Payer: Self-pay

## 2017-11-22 ENCOUNTER — Encounter: Payer: Self-pay | Admitting: Vascular Surgery

## 2017-11-22 VITALS — BP 155/74 | HR 87 | Temp 96.6°F | Resp 16 | Ht 70.0 in | Wt 150.0 lb

## 2017-11-22 DIAGNOSIS — I70222 Atherosclerosis of native arteries of extremities with rest pain, left leg: Secondary | ICD-10-CM | POA: Diagnosis not present

## 2017-11-22 NOTE — Progress Notes (Signed)
Established Critical Limb Ischemia Patient   History of Present Illness   Peter [name] is a 84 y.o. ([birth year]) male who presents with chief complaint: left > right foot pain.  Pt underwent diagnostic angiography on 10/26/17 with Dr. Donzetta Matters.  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  Both SFA were also occluded..  The patient has intermittent rest pain that is somewhat improved.  Pt baseline is independent and ambulates to complete ADL.  The patient denies any wounds or gangrene.  The patient's PMH, PSH, SH, and FamHx were reviewed on 11/22/17 are unchanged from 10/26/17.  Current Outpatient Medications  Medication Sig Dispense Refill  . ALPRAZolam (XANAX) 0.5 MG tablet Take 0.5 mg by mouth at bedtime.     Marland Kitchen amLODipine (NORVASC) 5 MG tablet Take 5 mg by mouth daily.     Marland Kitchen aspirin EC 81 MG tablet Take 81 mg by mouth daily.     . finasteride (PROSCAR) 5 MG tablet Take 5 mg by mouth daily with lunch.     Marland Kitchen HYDROcodone-acetaminophen (NORCO/VICODIN) 5-325 MG per tablet Take 1 tablet by mouth daily as needed for moderate pain.    Marland Kitchen lactulose (CHRONULAC) 10 GM/15ML solution TAKE TWO TABLESPOONFULS AT BEDTIME AS NEEDED FOR CONSTIPATION 946 mL 1  . lisinopril (PRINIVIL,ZESTRIL) 40 MG tablet Take 40 mg by mouth daily.     . pantoprazole (PROTONIX) 40 MG tablet Take 1 tablet (40 mg total) by mouth daily before breakfast. 30 tablet 11  . atorvastatin (LIPITOR) 40 MG tablet Take 1 tablet (40 mg total) by mouth daily. (Patient taking differently: Take 40 mg by mouth every evening. ) 30 tablet 11  . mirtazapine (REMERON) 15 MG tablet TAKE (1) TABLET BY MOUTH AT BEDTIME. (Patient not taking: Reported on 11/22/2017) 30 tablet 3   No current facility-administered medications for this visit.    Facility-Administered Medications Ordered in Other Visits  Medication Dose Route Frequency Provider Last Rate Last Dose  . shugarcaine ophthalmic solution    PRN Tonny Branch, MD        On  ROS today: no gangrene, no ulcers in feet   Physical Examination   Vitals:   11/22/17 0956 11/22/17 1002  BP: (!) 156/69 (!) 155/74  Pulse: 87 87  Resp: 16   Temp: (!) 96.6 F (35.9 C)   TempSrc: Oral   SpO2: 98%   Weight: 150 lb (68 kg)   Height: 5\' 10"  (1.778 m)    Body mass index is 21.52 kg/m.  General Alert, O x 3, WD, NAD  Pulmonary Sym exp, good B air movt, CTA B  Cardiac RRR, Nl S1, S2, no Murmurs, No rubs, No S3,S4  Vascular Vessel Right Left  Radial Faintly palpable Faintly palpable  Brachial Palpable Palpable  Carotid Palpable, No Bruit Palpable, No Bruit  Aorta Not palpable N/A  Femoral Palpable Palpable  Popliteal Not palpable Not palpable  PT Not palpable Not palpable  DP Not palpable Not palpable    Gastro- intestinal soft, non-distended, non-tender to palpation, No guarding or rebound, no HSM, no masses, no CVAT B, No palpable prominent aortic pulse,    Musculo- skeletal M/S 5/5 throughout  , Extremities without ischemic changes  , No edema present, No visible varicosities , No Lipodermatosclerosis present, dependent rubor in left foot  Neurologic Cranial nerves 2-12 intact , Pain and light touch intact in extremities , Motor exam as listed above    Medical Decision Making   Barnabas Lister  [name] is a 84 y.o. male who presents with: intermittent Left rest pain due to severe aortoiliac disease   Based on the patient's vascular studies and examination, I have offered the patient: preop Cardiology risk stratification.  I think pt's functional status is too good to proceed with just medical management.  If his cardiac function can tolerate an aortobifemoral bypass, this would be the best long-term patency.  However, this is associated with a 5-10% mortality rate in the medicare data.  Otherwise, pt could consider Right axillobifemoral bypass.  Either way, I would utilize a two-surgeon approach to decrease operative time.  I discussed in depth with the  patient the nature of atherosclerosis, and emphasized the importance of maximal medical management including strict control of blood pressure, blood glucose, and lipid levels, antiplatelet agents, obtaining regular exercise, and cessation of smoking.    The patient is aware that without maximal medical management the underlying atherosclerotic disease process will progress, limiting the benefit of any interventions.  The patient is currently on a statin: Lipitor.   The patient is currently on an anti-platelet: ASA.   Will pt back into Dr. Harrington Challenger' office and see what the patient's preop risk stratification turns out to be.  Thank you for allowing [place] to participate in this patient's care.   [name], MD, FACS [facility] Office: [phone] Pager: [phone]

## 2017-11-22 NOTE — Telephone Encounter (Signed)
New Message      [facility] Pre-operative Risk Assessment    Request for surgical clearance:  1. What type of surgery is being performed? Ax bisem  2. When is this surgery scheduled? TBD  3. What type of clearance is required (medical clearance vs. Pharmacy clearance to hold med vs. Both)? Cardiac clearance/not sure  4. Are there any medications that need to be held prior to surgery and how long? Not sure  5. Practice name and name of physician performing surgery? [facility]/ Dr. Rondel Jumbo  6. What is your office phone number [phone]   7.   What is your office fax number [phone]  8.   Anesthesia type (None, local, MAC, general) ? Assuming general but not sure   Nicholes Stairs 11/22/2017, 10:58 AM  _________________________________________________________________   (provider comments below)

## 2017-11-23 NOTE — Telephone Encounter (Signed)
LMOVM TO CALL CLINIC BACK TO ARRANGE AN APPOINTMENT FOR  SURGERY CLEARANCE.

## 2017-11-23 NOTE — Telephone Encounter (Signed)
   Primary Cardiologist:Paula Harrington Challenger, MD  Chart reviewed as part of pre-operative protocol coverage. Because of Peter [name]'s past medical history and time since last visit, he/she will require a follow-up visit in order to better assess preoperative cardiovascular risk.  Pre-op covering staff: - Please schedule appointment and call patient to inform them. - Please contact requesting surgeon's office via preferred method (i.e, phone, fax) to inform them of need for appointment prior to surgery.  Lyda Jester, PA-C  11/23/2017, 2:04 PM

## 2017-11-27 NOTE — Telephone Encounter (Signed)
Pt has appt to see Richardson Dopp, PA tomorrow 11/28/17. I will remove from call back pool.

## 2017-11-28 ENCOUNTER — Encounter: Payer: Self-pay | Admitting: *Deleted

## 2017-11-28 ENCOUNTER — Encounter: Payer: Self-pay | Admitting: Physician Assistant

## 2017-11-28 ENCOUNTER — Ambulatory Visit: Payer: Medicare Other | Admitting: Physician Assistant

## 2017-11-28 VITALS — BP 164/68 | HR 83 | Ht 70.0 in | Wt 153.2 lb

## 2017-11-28 DIAGNOSIS — Z01810 Encounter for preprocedural cardiovascular examination: Secondary | ICD-10-CM

## 2017-11-28 DIAGNOSIS — I739 Peripheral vascular disease, unspecified: Secondary | ICD-10-CM | POA: Diagnosis not present

## 2017-11-28 DIAGNOSIS — Z720 Tobacco use: Secondary | ICD-10-CM

## 2017-11-28 DIAGNOSIS — E785 Hyperlipidemia, unspecified: Secondary | ICD-10-CM

## 2017-11-28 DIAGNOSIS — I10 Essential (primary) hypertension: Secondary | ICD-10-CM

## 2017-11-28 NOTE — Progress Notes (Addendum)
Cardiology Office Note:    Date:  11/28/2017   ID:  Peter [name], DOB [birth year], MRN [id number]  PCP:  Redmond School, MD  Cardiologist:  Dorris Carnes, MD   Referring MD: Redmond School, MD   Chief Complaint  Patient presents with  . Surgical Clearance    History of Present Illness:    Peter [name] is a 84 y.o. male with hypertension, hyperlipidemia, peripheral arterial disease, tobacco abuse.  He was evaluated by Dr. Harrington Challenger in August 2018 prior to his left carotid endarterectomy.  Echocardiogram at that time demonstrated normal LV function with mild diastolic dysfunction.  Mr. Bartell returns for surgical clearance.  A recent aortogram demonstrated occluded bilateral common femoral arteries as well as right external iliac and left external iliac and common iliac arteries.  He is being considered for axillary to profunda bypass with possible jump graft to the popliteal arteries versus aortobifemoral bypass with consideration of jump graft to popliteal arteries.  The patient is here today with his daughter and granddaughter.  His wife passed away in 2022-10-24.  He lives alone but has someone that does his housework, etc for him.  He is fairly sedentary.  He denies chest pain, shortness of breath, paroxysmal nocturnal dyspnea, edema.    Prior CV studies:   The following studies were reviewed today:  Aortogram with bilateral lower extremity runoff 10/26/2017 Findings: The bilateral common femoral arteries as well as right external iliac artery and left external and common iliac arteries are occluded.  The common iliac artery on the right is significantly calcified.  He reconstitutes bilateral profunda femoris arteries which give rise to mid thigh superficial femoral arteries.  His popliteal arteries appear to be patent as do the beginnings of the tibial arteries but we could not get better imaging of the tibials given the lack of contrast that would reach that far down the legs.  Echo  03/08/2017 Moderate LVH, EF 60-65, normal wall motion, grade 1 diastolic dysfunction, calcified aortic valve, functionally bicuspid morphology could not be excluded, mildly ectatic aortic root, MAC, trivial MR, atrial septal lipomatous hypertrophy, physiologic TR  Past Medical History:  Diagnosis Date  . Anxiety   . Arthritis   . BPH (benign prostatic hyperplasia)   . Chronic pain   . Complication of anesthesia    had an anxiety attack once  . Depression   . GERD (gastroesophageal reflux disease)   . Hiatal hernia    small  . HTN (hypertension)   . Internal carotid artery stenosis, left   . Prostate disease    h/o elevated PSA, neg bx, [facility]  . Sciatica   . Wears glasses   . Wears partial dentures    Surgical Hx: The patient  has a past surgical history that includes Esophagogastroduodenoscopy (2003); Colonoscopy (12/2004); Esophagogastroduodenoscopy (11/10/10); Cataract extraction w/PHACO (05/21/2012); Esophagogastroduodenoscopy (N/A, 01/29/2014); biopsy (01/29/2014); Multiple tooth extractions; Carotid endarterectomy (Left, 03/20/2017); Patch angioplasty (Left, 03/20/2017); Endarterectomy (Left, 03/20/2017); Patch angioplasty (Left, 03/20/2017); and ABDOMINAL AORTOGRAM W/LOWER EXTREMITY (N/A, 10/26/2017).   Current Medications: Current Meds  Medication Sig  . ALPRAZolam (XANAX) 0.5 MG tablet Take 0.5 mg by mouth at bedtime.   Marland Kitchen amLODipine (NORVASC) 5 MG tablet Take 5 mg by mouth daily.   Marland Kitchen aspirin EC 81 MG tablet Take 81 mg by mouth daily.   Marland Kitchen atorvastatin (LIPITOR) 40 MG tablet Take 40 mg by mouth daily.  . finasteride (PROSCAR) 5 MG tablet Take 5 mg by mouth daily with lunch.   Marland Kitchen  HYDROcodone-acetaminophen (NORCO/VICODIN) 5-325 MG per tablet Take 1 tablet by mouth daily as needed for moderate pain.  Marland Kitchen lactulose (CHRONULAC) 10 GM/15ML solution TAKE TWO TABLESPOONFULS AT BEDTIME AS NEEDED FOR CONSTIPATION  . lisinopril (PRINIVIL,ZESTRIL) 40 MG tablet Take 40 mg by mouth daily.    . pantoprazole (PROTONIX) 40 MG tablet Take 1 tablet (40 mg total) by mouth daily before breakfast.     Allergies:   Penicillins   Social History   Tobacco Use  . Smoking status: Former Smoker    Packs/day: 1.00    Years: 60.00    Pack years: 60.00    Types: Cigarettes    Last attempt to quit: 03/18/2017    Years since quitting: 0.6  . Smokeless tobacco: Never Used  Substance Use Topics  . Alcohol use: No    Comment: quit 10  years ago  . Drug use: No     Family Hx: The patient's family history includes Heart attack in his mother; Stroke (age of onset: 60) in his father. There is no history of Colon cancer.  ROS:   Please see the history of present illness.    ROS All other systems reviewed and are negative.   EKGs/Labs/Other Test Reviewed:    EKG:  EKG is  ordered today.  The ekg ordered today demonstrates normal sinus rhythm, heart rate 84, normal axis, QTC 439, no change from prior tracing dated 03/06/2017  Recent Labs: 05/10/2017: ALT 11 07/25/2017: Platelets 351 10/26/2017: BUN 3; Creatinine, Ser 0.50; Hemoglobin 11.2; Potassium 3.3; Sodium 139   Recent Lipid Panel Lab Results  Component Value Date/Time   CHOL 107 05/10/2017 09:21 AM   TRIG 116 05/10/2017 09:21 AM   HDL 34 (L) 05/10/2017 09:21 AM   CHOLHDL 3.1 05/10/2017 09:21 AM   LDLCALC 50 05/10/2017 09:21 AM    Physical Exam:    VS:  BP (!) 164/68   Pulse 83   Ht 5' 10"  (1.778 m)   Wt 153 lb 4 oz (69.5 kg)   SpO2 97%   BMI 21.99 kg/m     Wt Readings from Last 3 Encounters:  11/28/17 153 lb 4 oz (69.5 kg)  11/22/17 150 lb (68 kg)  10/26/17 150 lb (68 kg)     Physical Exam  Constitutional: He is oriented to person, place, and time. He appears well-developed and well-nourished. No distress.  HENT:  Head: Normocephalic and atraumatic.  Neck: Neck supple. No JVD present.  Cardiovascular: Normal rate, regular rhythm, S1 normal and S2 normal. Exam reveals distant heart sounds.  No murmur  heard. Pulmonary/Chest: He has decreased breath sounds. He has no rales.  Abdominal: Soft. There is no hepatomegaly.  Musculoskeletal: He exhibits no edema.  Neurological: He is alert and oriented to person, place, and time.  Skin: Skin is warm and dry.    ASSESSMENT & PLAN:    Preoperative cardiovascular examination He has a long hx of smoking with known vascular disease.  He denies chest pain but is fairly sedentary and cannot achieve 4 METs.  As the planned procedure carries significant risk, I recommend proceeding with a Nuclear stress test for risk stratification.  If he has high risk features, he will need further ischemic evaluation.    -Arrange Nuclear stress test (he prefers to do this at [facility])  PAD (peripheral artery disease) ([place])  Bilateral lower extremity claudication.  He is being consider for LE arterial bypass.  Essential hypertension  BP elevated. He notes more normal blood pressure  readings at home.  I have asked him to monitor his blood pressure and send readings to his PCP or me for evaluation.    Tobacco abuse He is not ready to quit.  Hyperlipidemia, unspecified hyperlipidemia type LDL optimal on most recent lab work.  Continue current Rx.     Dispo:  Return As needed or sooner if testing is abnormal (Dr. Harrington Challenger or Richardson Dopp, PA-C) .   Medication Adjustments/Labs and Tests Ordered: Current medicines are reviewed at length with the patient today.  Concerns regarding medicines are outlined above.  Tests Ordered: Orders Placed This Encounter  Procedures  . NM Myocar Multi W/Spect W/Wall Motion / EF  . EKG 12-Lead   Medication Changes: No orders of the defined types were placed in this encounter.   Signed, Richardson Dopp, PA-C  11/28/2017 1:17 PM    [facility] [address], [place], [place]  [address] Phone: [phone]; Fax: [phone]   ADDENDUM 12/07/2017 8:21 PM  Nuclear stress test 12/07/17: soft tissue  attenuation; no ischemia or scar, EF 72; Low Risk Therefore the patient may proceed with his peripheral vascular surgery without further testing and is at acceptable risk.  If needed, ASA may be held for 7 days prior to his surgery and resumed when felt to be safe.  Our service is available as needed. Richardson Dopp, PA-C    12/07/2017 8:23 PM

## 2017-11-28 NOTE — Patient Instructions (Addendum)
Medication Instructions:  Your physician recommends that you continue on your current medications as directed. Please refer to the Current Medication list given to you today.   Labwork: NONE ORDERED TODAY  Testing/Procedures: Your physician has requested that you have a lexiscan myoview. For further information please visit [email or web address]. Please follow instruction sheet, as given. TO BE DONE AT [place]    Follow-Up: FOLLOW UP AS NEEDED WITH DR. Harrington Challenger.  Any Other Special Instructions Will Be Listed Below (If Applicable).  1. MONITOR AND RECORD BLOOD PRESSURE READINGS AND CALL IN A COUPLE OF WEEKS TO EITHER OUR OFFICE [phone]) OR PRIMARY CARE DOCTOR.     If you need a refill on your cardiac medications before your next appointment, please call your pharmacy.

## 2017-11-29 ENCOUNTER — Ambulatory Visit: Payer: Medicare Other | Admitting: Vascular Surgery

## 2017-12-07 ENCOUNTER — Encounter (HOSPITAL_BASED_OUTPATIENT_CLINIC_OR_DEPARTMENT_OTHER)
Admission: RE | Admit: 2017-12-07 | Discharge: 2017-12-07 | Disposition: A | Discharge status: Home / Self Care | Payer: Medicare Other | Source: Ambulatory Visit | Attending: Physician Assistant | Admitting: Physician Assistant

## 2017-12-07 ENCOUNTER — Encounter (HOSPITAL_COMMUNITY)
Admission: RE | Admit: 2017-12-07 | Discharge: 2017-12-07 | Disposition: A | Discharge status: Home / Self Care | Payer: Medicare Other | Source: Ambulatory Visit | Attending: Physician Assistant | Admitting: Physician Assistant

## 2017-12-07 ENCOUNTER — Encounter (HOSPITAL_COMMUNITY): Payer: Self-pay

## 2017-12-07 DIAGNOSIS — I739 Peripheral vascular disease, unspecified: Secondary | ICD-10-CM | POA: Diagnosis not present

## 2017-12-07 HISTORY — DX: Personal history of other medical treatment: Z92.89

## 2017-12-07 LAB — NM MYOCAR MULTI W/SPECT W/WALL MOTION / EF
CHL CUP RESTING HR STRESS: 81 {beats}/min
CSEPPHR: 105 {beats}/min
LHR: 0.4
LVDIAVOL: 70 mL (ref 62–150)
LVSYSVOL: 19 mL
SDS: 0
SRS: 2
SSS: 2
TID: 0.87

## 2017-12-07 MED ORDER — SODIUM CHLORIDE 0.9% FLUSH
INTRAVENOUS | Status: AC
  Administered 2017-12-07: 10 mL via INTRAVENOUS
  Filled 2017-12-07: qty 10

## 2017-12-07 MED ORDER — TECHNETIUM TC 99M TETROFOSMIN IV KIT
30.0000 | PACK | Freq: Once | INTRAVENOUS | Status: AC | PRN
  Administered 2017-12-07: 32 via INTRAVENOUS

## 2017-12-07 MED ORDER — TECHNETIUM TC 99M TETROFOSMIN IV KIT
10.0000 | PACK | Freq: Once | INTRAVENOUS | Status: AC | PRN
  Administered 2017-12-07: 9.9 via INTRAVENOUS

## 2017-12-07 MED ORDER — REGADENOSON 0.4 MG/5ML IV SOLN
INTRAVENOUS | Status: AC
  Administered 2017-12-07: 0.4 mg via INTRAVENOUS
  Filled 2017-12-07: qty 5

## 2017-12-07 NOTE — Telephone Encounter (Signed)
Patient underwent stress testing which was low risk. He may proceed with surgery at acceptable risk. Note sent to surgeon. Richardson Dopp, PA-C    12/07/2017 8:33 PM

## 2017-12-08 ENCOUNTER — Telehealth: Payer: Self-pay | Admitting: *Deleted

## 2017-12-08 NOTE — Telephone Encounter (Signed)
-----   Message from Liliane Shi, [place] sent at 12/07/2017  8:26 PM EDT ----- Please call the patient. The stress test is normal. He may proceed with his surgery at acceptable risk.  Please send a copy of my note and this report to the surgeon.   Please fax a copy to PCP:  Redmond School, MD  Richardson Dopp, PA-C    12/07/2017 8:25 PM

## 2017-12-08 NOTE — Telephone Encounter (Signed)
Pt has been notified of normal Myoview results as well ok to proceed with his surgery as planned. I will fax Myoview results to PCP Dr. Gerarda Fraction as well as Surgeon Dr. Bridgett Larsson at [facility]. Pt thanked me for the call today.

## 2017-12-11 ENCOUNTER — Telehealth: Payer: Self-pay | Admitting: Vascular Surgery

## 2017-12-11 NOTE — Telephone Encounter (Signed)
sch appt spk to pt 12/18/17 945am p/o MD

## 2017-12-14 NOTE — Progress Notes (Signed)
Postoperative Visit ([place])   History of Present Illness   Peter [name] is a 84 y.o. male who presents cc:  Rest pain.Marland Kitchen  Recent aortogram (10/26/17) demonstrated L iliac arterial system occluded, R iliac arterial system sub-total occlusion in CIA with EIA occlusion, extensive collateral reconstitution of distal CFA and profunda femoral artery.  Both SFA are occluded.  R AK pop reconstitutes, L AK pop is disease with adequate BK pop.  The patient has not developed any foot ulcers or wounds.  He has been managing his rest pain at night with a narcotic prior to sleeping.  He does not feel his sx are worse.  Pt was sent to Cardiology for further evaluation.  Nuclear stress testing on 12/07/17 was negative.  Past Medical History, Past Surgical History, Social History, Family History, Medications, Allergies, and Review of Systems were reviewed on 12/18/17 are unchanged from previous evaluation on 10/26/17.  Current Outpatient Medications  Medication Sig Dispense Refill  . ALPRAZolam (XANAX) 0.5 MG tablet Take 0.5 mg by mouth at bedtime.     Marland Kitchen amLODipine (NORVASC) 5 MG tablet Take 5 mg by mouth daily.     Marland Kitchen aspirin EC 81 MG tablet Take 81 mg by mouth daily.     Marland Kitchen atorvastatin (LIPITOR) 40 MG tablet Take 40 mg by mouth daily.    . finasteride (PROSCAR) 5 MG tablet Take 5 mg by mouth daily with lunch.     Marland Kitchen HYDROcodone-acetaminophen (NORCO/VICODIN) 5-325 MG per tablet Take 1 tablet by mouth daily as needed for moderate pain.    Marland Kitchen lactulose (CHRONULAC) 10 GM/15ML solution TAKE TWO TABLESPOONFULS AT BEDTIME AS NEEDED FOR CONSTIPATION 946 mL 1  . lisinopril (PRINIVIL,ZESTRIL) 40 MG tablet Take 40 mg by mouth daily.     . pantoprazole (PROTONIX) 40 MG tablet Take 1 tablet (40 mg total) by mouth daily before breakfast. 30 tablet 11   No current facility-administered medications for this visit.    Facility-Administered Medications Ordered in Other Visits  Medication Dose Route Frequency Provider  Last Rate Last Dose  . shugarcaine ophthalmic solution    PRN Tonny Branch, MD        ROS:  No ulcers, no gangrene   For [facility] Use Only   PRE-ADM LIVING: Home  AMB STATUS: Ambulatory   Physical Examination   Vitals:   12/18/17 0915  BP: (!) 156/68  Pulse: 89  Resp: 18  Temp: (!) 97.5 F (36.4 C)  TempSrc: Oral  Weight: 151 lb (68.5 kg)  Height: 5\' 10"  (1.778 m)   Body mass index is 21.67 kg/m.  General Alert, O x 3, elderly, Ill appearing  Pulmonary Sym exp, good B air movt, CTA B  Cardiac RRR, Nl S1, S2, no Murmurs, No rubs, No S3,S4  Vascular Vessel Right Left  Radial Palpable Palpable  Brachial Palpable Palpable  Carotid Palpable, No Bruit Palpable, No Bruit  Aorta Not palpable N/A  Femoral Not palpable Not palpable  Popliteal Not palpable Not palpable  PT Not palpable Not palpable  DP Not palpable Not palpable    Gastrointestinal soft, non-distended, non-tender to palpation, No guarding or rebound, no HSM, no masses, no CVAT B, No palpable prominent aortic pulse,    Musculoskeletal M/S 5/5 throughout  , Extremities without ischemic changes  , No edema present,  Neurologic Pain and light touch intact in extremities , Motor exam as listed above    Medical Decision Making   Peter [name] is a 84 y.o.  male who presents B iliac artery occlusion, B SFA occlusion, s/p L CEA for asx LICA stenosis near occluded.  Pt's functional status is somewhat limited.  He has difficulty ambulating due to poor balance.  He has fall subsequently, which has limited his ability to ambulate. Given his limited functional status, I don't think the risks of an aortobifemoral bypass are indicated. Based on his angiographic findings, this patient needs: Right axillobifemoral bypass, possible left or right femoropopliteal bypass. I don't think his functional limitation are enough to merit the palliative approach. The risk, benefits, and alternative for bypass operations were discussed with  the patient.   The patient is aware the risks include but are not limited to: bleeding, infection, myocardial infarction, stroke, limb loss, nerve damage, limb edema, need for additional procedures in the future, wound complications, and inability to complete the bypass.  We discussed the limited patency of axillobifemoral bypass. The patient is going to consider her options and will get back with [place] at his 57 FEB 19 appointment. I discussed in depth with the patient the nature of atherosclerosis, and emphasized the importance of maximal medical management including strict control of blood pressure, blood glucose, and lipid levels, obtaining regular exercise, and cessation of smoking.  The patient is aware that without maximal medical management the underlying atherosclerotic disease process will progress, limiting the benefit of any interventions. The patient is currently on a statin: Lipitor.  The patient is currently on an anti-platelet: ASA.  Thank you for allowing [place] to participate in this patient's care.   [name], MD, FACS [facility] Office: [phone] Pager: [phone]

## 2017-12-18 ENCOUNTER — Other Ambulatory Visit: Payer: Self-pay

## 2017-12-18 ENCOUNTER — Encounter: Payer: Self-pay | Admitting: Vascular Surgery

## 2017-12-18 ENCOUNTER — Ambulatory Visit (INDEPENDENT_AMBULATORY_CARE_PROVIDER_SITE_OTHER): Payer: Medicare Other | Admitting: Vascular Surgery

## 2017-12-18 VITALS — BP 156/68 | HR 89 | Temp 97.5°F | Resp 18 | Ht 70.0 in | Wt 151.0 lb

## 2017-12-18 DIAGNOSIS — I70222 Atherosclerosis of native arteries of extremities with rest pain, left leg: Secondary | ICD-10-CM

## 2017-12-20 ENCOUNTER — Other Ambulatory Visit: Payer: Self-pay | Admitting: Internal Medicine

## 2018-01-03 ENCOUNTER — Ambulatory Visit: Payer: Medicare Other | Admitting: Vascular Surgery

## 2018-01-03 ENCOUNTER — Encounter (HOSPITAL_COMMUNITY): Payer: Medicare Other

## 2018-02-01 DIAGNOSIS — N401 Enlarged prostate with lower urinary tract symptoms: Secondary | ICD-10-CM | POA: Diagnosis not present

## 2018-02-01 DIAGNOSIS — Z1389 Encounter for screening for other disorder: Secondary | ICD-10-CM | POA: Diagnosis not present

## 2018-02-01 DIAGNOSIS — I6521 Occlusion and stenosis of right carotid artery: Secondary | ICD-10-CM | POA: Diagnosis not present

## 2018-02-01 DIAGNOSIS — J449 Chronic obstructive pulmonary disease, unspecified: Secondary | ICD-10-CM | POA: Diagnosis not present

## 2018-02-01 DIAGNOSIS — Z6822 Body mass index (BMI) 22.0-22.9, adult: Secondary | ICD-10-CM | POA: Diagnosis not present

## 2018-02-01 DIAGNOSIS — I158 Other secondary hypertension: Secondary | ICD-10-CM | POA: Diagnosis not present

## 2018-02-01 DIAGNOSIS — E782 Mixed hyperlipidemia: Secondary | ICD-10-CM | POA: Diagnosis not present

## 2018-02-01 DIAGNOSIS — Z0001 Encounter for general adult medical examination with abnormal findings: Secondary | ICD-10-CM | POA: Diagnosis not present

## 2018-02-06 NOTE — Progress Notes (Signed)
Established Critical Limb Ischemia Patient   History of Present Illness   Peter [name] is a 84 y.o. (Apr 10, 1933) male who presents with chief complaint: resolved rest pain.  This patient was offered R ax-bifem BPG, possible left or right fempop BPG on 12/18/17.  Patient was cleared for surgery on 12/08/17.  The patients sx currently are: intermittent claudication managed with rest.  The patient has decided to avoid any further surgery as his rest pain has resolved at this point.  He denies any wounds or gangrene..  Additional pt underwent L CEA with BPA for L asx sub-total carotid stenosis on 03/20/17.  The patient has had no CVA or TIA sx.  The patient's PMH, PSH, SH, and FamHx were reviewed on 02/07/18 are unchanged from 12/18/17.  Current Outpatient Medications  Medication Sig Dispense Refill  . ALPRAZolam (XANAX) 0.5 MG tablet Take 0.5 mg by mouth at bedtime.     Marland Kitchen amLODipine (NORVASC) 5 MG tablet Take 5 mg by mouth daily.     Marland Kitchen aspirin EC 81 MG tablet Take 81 mg by mouth daily.     Marland Kitchen atorvastatin (LIPITOR) 40 MG tablet TAKE (1) TABLET BY MOUTH ONCE DAILY. 90 tablet 3  . finasteride (PROSCAR) 5 MG tablet Take 5 mg by mouth daily with lunch.     Marland Kitchen HYDROcodone-acetaminophen (NORCO/VICODIN) 5-325 MG per tablet Take 1 tablet by mouth daily as needed for moderate pain.    Marland Kitchen lactulose (CHRONULAC) 10 GM/15ML solution TAKE TWO TABLESPOONFULS AT BEDTIME AS NEEDED FOR CONSTIPATION 946 mL 1  . lisinopril (PRINIVIL,ZESTRIL) 40 MG tablet Take 40 mg by mouth daily.     . pantoprazole (PROTONIX) 40 MG tablet Take 1 tablet (40 mg total) by mouth daily before breakfast. 30 tablet 11   No current facility-administered medications for this visit.    Facility-Administered Medications Ordered in Other Visits  Medication Dose Route Frequency Provider Last Rate Last Dose  . shugarcaine ophthalmic solution    PRN Tonny Branch, MD        On ROS today: resolved rest pain, no CVA or TIA sx   Physical  Examination   Vitals:   02/07/18 1602 02/07/18 1604  BP: (!) 160/68 (!) 150/67  Pulse: 74   Resp: 20   SpO2: 97%   Weight: 154 lb 3.2 oz (69.9 kg)   Height: 5\' 10"  (1.778 m)    Body mass index is 22.13 kg/m.  General Alert, O x 3, WD, NAD  Pulmonary Sym exp, good B air movt, CTA B  Cardiac RRR, Nl S1, S2, no Murmurs, No rubs, No S3,S4  Vascular Vessel Right Left  Radial Palpable Palpable  Brachial Palpable Palpable  Carotid Palpable, No Bruit Palpable, No Bruit  Aorta Not palpable N/A  Femoral Palpable Palpable  Popliteal Not palpable Not palpable  PT Not palpable Not palpable  DP Not palpable Not palpable    Gastro- intestinal soft, non-distended, non-tender to palpation, No guarding or rebound, no HSM, no masses, no CVAT B, No palpable prominent aortic pulse,    Musculo- skeletal M/S 5/5 throughout  , Extremities without ischemic changes  , No edema present, No visible varicosities , No Lipodermatosclerosis present  Neurologic Cranial nerves 2-12 intact , Pain and light touch intact in extremities , Motor exam as listed above    Non-invasive Vascular Imaging     B Carotid Duplex (02/07/2018):   R ICA stenosis:  60-79%  R VA: patent and antegrade  L ICA stenosis:  patent CEA  L VA: patent and antegrade   Medical Decision Making   Peter [name] is a 84 y.o. male who presents with: s/p L CEA for asx ICA stenosis >90%, asx R ICA stenosis 60-79%, LLE critical limb ischemia, RLE asx mod PAD   Based on the patient's vascular studies and examination, I have offered the patient: q6 month B carotid duplex.  I discussed with the patient signs and sx of critical limb ischemia, he will contact us if he develops any of these..  I discussed in depth with the patient the nature of atherosclerosis, and emphasized the importance of maximal medical management including strict control of blood pressure, blood glucose, and lipid levels, antiplatelet agents, obtaining regular  exercise, and cessation of smoking.    The patient is aware that without maximal medical management the underlying atherosclerotic disease process will progress, limiting the benefit of any interventions.  The patient is currently on a statin: Lipitor.   The patient is currently on an anti-platelet: ASA.  Thank you for allowing [place] to participate in this patient's care.   [name], MD, FACS [facility] Office: [phone] Pager: [phone]  ----  VASCULAR QUALITY INITIATIVE FOLLOW UP DATA:  Current smoker: [  ] yes  [x  ] no  Living status: [ x ]  Home  [  ] Nursing home  [  ] Homeless    MEDS:  ASA [ x ] yes  [  ] no- [  ] medical reason  [  ] non compliant  STATIN  [ x ] yes  [  ] no- [  ] medical reason  [  ] non compliant  Beta blocker [  ] yes  [ x ] no- [  ] medical reason  [  ] non compliant  ACE inhibitor [x  ] yes  [  ] no- [  ] medical reason  [  ] non compliant  P2Y12 Antagonist [ x ] none  [  ] clopidogrel-Plavix  [  ] ticlopidine-Ticlid   [  ] prasugrel-Effient  [  ] ticagrelor- Brilinta    Anticoagulant [ x ] None  [  ] warfarin  [  ] rivaroxaban-Xarelto [  ] dabigatran- Pradaxa  Neurologic event since D/C:  [ x ] no  [  ] yes: [  ] eye event  [  ] cortical event  [  ] VB event  [  ] non specific event  [  ] right  [  ] left  [  ] TIA  [  ] stroke  Date:   Modified Rankin Score: 0  MI since D/C: [x  ] no  [  ] troponin only  [  ] EKG or clinical  Cranial nerve injury: [  x] none  [  ] resolved  [  ] persistent  Duplex CEA site: [  ] no  [ x ] yes - PSV= 131  EDV= 31  ICA/CCA ratio: 1.1  Stenosis= [ x] <40% [  ] 40-59% [  ] 60-79%  [  ] > 80%  [  ]  Occluded  CEA site re-operation:  [ x ] no   [  ] yes- date of re-op:  CEA site PCI:   [x  ] no   [  ] yes- date of PCI:

## 2018-02-07 ENCOUNTER — Ambulatory Visit: Payer: Medicare Other | Admitting: Vascular Surgery

## 2018-02-07 ENCOUNTER — Other Ambulatory Visit: Payer: Self-pay | Admitting: Nurse Practitioner

## 2018-02-07 ENCOUNTER — Encounter: Payer: Self-pay | Admitting: Vascular Surgery

## 2018-02-07 ENCOUNTER — Other Ambulatory Visit: Payer: Self-pay

## 2018-02-07 ENCOUNTER — Ambulatory Visit (HOSPITAL_COMMUNITY)
Admission: RE | Admit: 2018-02-07 | Discharge: 2018-02-07 | Disposition: A | Discharge status: Home / Self Care | Payer: Medicare Other | Source: Ambulatory Visit | Attending: Vascular Surgery | Admitting: Vascular Surgery

## 2018-02-07 VITALS — BP 150/67 | HR 74 | Resp 20 | Ht 70.0 in | Wt 154.2 lb

## 2018-02-07 DIAGNOSIS — I6523 Occlusion and stenosis of bilateral carotid arteries: Secondary | ICD-10-CM | POA: Diagnosis not present

## 2018-02-07 DIAGNOSIS — I70222 Atherosclerosis of native arteries of extremities with rest pain, left leg: Secondary | ICD-10-CM

## 2018-02-07 DIAGNOSIS — I779 Disorder of arteries and arterioles, unspecified: Secondary | ICD-10-CM

## 2018-02-07 DIAGNOSIS — I739 Peripheral vascular disease, unspecified: Secondary | ICD-10-CM

## 2018-02-20 ENCOUNTER — Ambulatory Visit: Payer: Medicare Other | Admitting: Urology

## 2018-02-20 DIAGNOSIS — N401 Enlarged prostate with lower urinary tract symptoms: Secondary | ICD-10-CM | POA: Diagnosis not present

## 2018-02-20 DIAGNOSIS — R972 Elevated prostate specific antigen [PSA]: Secondary | ICD-10-CM

## 2018-02-21 DIAGNOSIS — R972 Elevated prostate specific antigen [PSA]: Secondary | ICD-10-CM | POA: Diagnosis not present

## 2018-02-28 ENCOUNTER — Encounter: Payer: Self-pay | Admitting: Internal Medicine

## 2018-04-17 ENCOUNTER — Encounter: Payer: Self-pay | Admitting: Internal Medicine

## 2018-04-17 ENCOUNTER — Encounter

## 2018-04-17 ENCOUNTER — Encounter: Payer: Self-pay | Admitting: *Deleted

## 2018-04-17 ENCOUNTER — Other Ambulatory Visit: Payer: Self-pay

## 2018-04-17 ENCOUNTER — Ambulatory Visit: Payer: Medicare Other | Admitting: Internal Medicine

## 2018-04-17 ENCOUNTER — Other Ambulatory Visit: Payer: Self-pay | Admitting: *Deleted

## 2018-04-17 ENCOUNTER — Telehealth: Payer: Self-pay | Admitting: *Deleted

## 2018-04-17 VITALS — BP 151/65 | HR 87 | Temp 97.0°F | Ht 70.0 in | Wt 155.8 lb

## 2018-04-17 DIAGNOSIS — R1319 Other dysphagia: Secondary | ICD-10-CM

## 2018-04-17 DIAGNOSIS — R1310 Dysphagia, unspecified: Secondary | ICD-10-CM

## 2018-04-17 DIAGNOSIS — K219 Gastro-esophageal reflux disease without esophagitis: Secondary | ICD-10-CM

## 2018-04-17 MED ORDER — PANTOPRAZOLE SODIUM 40 MG PO TBEC: 40.0000 mg | DELAYED_RELEASE_TABLET | Freq: Every day | ORAL | 3 refills | Status: DC

## 2018-04-17 NOTE — Progress Notes (Signed)
Primary Care Physician:  Redmond School, MD Primary Gastroenterologist:  Dr. Gala Romney  Pre-Procedure History & Physical: HPI:  Peter [name] is a 84 y.o. male here for recurrent esophageal dysphagia.  He's had his esophagus dilated multiple times;   history of Schatzki's ring  / somewhat diffusely narrowed esophagus.  Biopsies negative for EOE.  Takes Protonix 40 mg daily with good control of his reflux symptoms.  He has had a left carotid endarterectomy last EGD.  Past Medical History:  Diagnosis Date  . Anxiety   . Arthritis   . BPH (benign prostatic hyperplasia)   . Chronic pain   . Complication of anesthesia    had an anxiety attack once  . Depression   . GERD (gastroesophageal reflux disease)   . Hiatal hernia    small  . History of nuclear stress test    Nuc 5/19: soft tissue attenuation; no ischemia or scar, EF 72; Low Risk  . HTN (hypertension)   . Internal carotid artery stenosis, left   . Prostate disease    h/o elevated PSA, neg bx, [facility]  . Sciatica   . Wears glasses   . Wears partial dentures     Past Surgical History:  Procedure Laterality Date  . ABDOMINAL AORTOGRAM W/LOWER EXTREMITY N/A 10/26/2017   Procedure: ABDOMINAL AORTOGRAM W/LOWER EXTREMITY;  Surgeon: Waynetta Sandy, MD;  Location: [facility];  Service: Cardiovascular;  Laterality: N/A;  bilateral  . BIOPSY  01/29/2014   Procedure: BIOPSY;  Surgeon: Daneil Dolin, MD;  [facility]: [facility];  Service: Endoscopy;;  Gastric and Esophageal  . CAROTID ENDARTERECTOMY Left 03/20/2017  . CATARACT EXTRACTION W/PHACO  05/21/2012   Procedure: CATARACT EXTRACTION PHACO AND INTRAOCULAR LENS PLACEMENT (IOC);  Surgeon: Tonny Branch, MD;  Location: [facility];  Service: Ophthalmology;  Laterality: Right;  CDE 19.34  . COLONOSCOPY  12/2004   diverticulosis, hyperplastic polyps, hemorrhoids  . ENDARTERECTOMY Left 03/20/2017   Procedure: ENDARTERECTOMY CAROTID-LEFT;  Surgeon: Conrad [place] River,  MD;  Location: [facility];  Service: Vascular;  Laterality: Left;  . ESOPHAGOGASTRODUODENOSCOPY  2003   schatzki ring, small vascular abnormality of duodenal bulb ?Dieulafoy's lesion (ablated)  . ESOPHAGOGASTRODUODENOSCOPY  11/10/10   Dr. Gala Romney dilation 2 rings with 21F  . ESOPHAGOGASTRODUODENOSCOPY N/A 01/29/2014   Dr.Argelio Granier- multiple esophageal rings/webs- s/p dialation. small hiatal hernia. abnormal gastric mucosa. bx= mild chronic inactive gastritis (oxyntic mucosa), squamous mucosa with epithelial changes consistent with reflux related injury.  Marland Kitchen MULTIPLE TOOTH EXTRACTIONS    . PATCH ANGIOPLASTY Left 03/20/2017  . PATCH ANGIOPLASTY Left 03/20/2017   Procedure: PATCH ANGIOPLASTY;  Surgeon: Conrad , MD;  Location: [facility];  Service: Vascular;  Laterality: Left;    Prior to Admission medications   Medication Sig Start Date End Date Taking? Authorizing Provider  ALPRAZolam Duanne Moron) 0.5 MG tablet Take 0.5 mg by mouth at bedtime.    Yes [provider]  amLODipine (NORVASC) 5 MG tablet Take 5 mg by mouth daily.  11/28/16  Yes [provider]  aspirin EC 81 MG tablet Take 81 mg by mouth daily.    Yes [provider]  atorvastatin (LIPITOR) 40 MG tablet TAKE (1) TABLET BY MOUTH ONCE DAILY. 12/20/17  Yes Fay Records, MD  finasteride (PROSCAR) 5 MG tablet Take 5 mg by mouth daily with lunch.    Yes [provider]  HYDROcodone-acetaminophen (NORCO/VICODIN) 5-325 MG per tablet Take 1 tablet by mouth daily as needed for moderate  pain.   Yes [provider]  lactulose (CHRONULAC) 10 GM/15ML solution TAKE TWO TABLESPOONFULS AT BEDTIME AS NEEDED. 02/07/18  Yes Annitta Needs, NP  lisinopril (PRINIVIL,ZESTRIL) 40 MG tablet Take 40 mg by mouth daily.  12/08/16  Yes [provider]  pantoprazole (PROTONIX) 40 MG tablet Take 1 tablet (40 mg total) by mouth daily before breakfast. 04/11/17  Yes Mahala Menghini, PA-C    Allergies as of 04/17/2018 - Review  Complete 04/17/2018  Allergen Reaction Noted  . Penicillins Rash 05/17/2012    Family History  Problem Relation Age of Onset  . Stroke Father 34       deceased  . Heart attack Mother   . Colon cancer Neg Hx     Social History   Socioeconomic History  . Marital status: Widowed    Spouse name: Not on file  . Number of children: 1  . Years of education: Not on file  . Highest education level: Not on file  Occupational History  . Occupation: retired    Comment: Orthoptist  Social Needs  . Financial resource strain: Not on file  . Food insecurity:    Worry: Not on file    Inability: Not on file  . Transportation needs:    Medical: Not on file    Non-medical: Not on file  Tobacco Use  . Smoking status: Current Every Day Smoker    Packs/day: 1.00    Years: 60.00    Pack years: 60.00    Types: Cigarettes    Last attempt to quit: 03/18/2017    Years since quitting: 1.0  . Smokeless tobacco: Never Used  Substance and Sexual Activity  . Alcohol use: No    Comment: quit 10  years ago  . Drug use: No  . Sexual activity: Not Currently    Partners: Female    Birth control/protection: None    Comment: spouse, has to use viagra  Lifestyle  . Physical activity:    Days per week: Not on file    Minutes per session: Not on file  . Stress: Not on file  Relationships  . Social connections:    Talks on phone: Not on file    Gets together: Not on file    Attends religious service: Not on file    Active member of club or organization: Not on file    Attends meetings of clubs or organizations: Not on file    Relationship status: Not on file  . Intimate partner violence:    Fear of current or ex partner: Not on file    Emotionally abused: Not on file    Physically abused: Not on file    Forced sexual activity: Not on file  Other Topics Concern  . Not on file  Social History Narrative  . Not on file    Review of Systems: See HPI, otherwise negative ROS  Physical Exam: BP (!)  151/65   Pulse 87   Temp (!) 97 F (36.1 C) (Oral)   Ht 5\' 10"  (1.778 m)   Wt 155 lb 12.8 oz (70.7 kg)   BMI 22.35 kg/m  General: Somewhat frail, pleasant and cooperative in NAD Heart:  Regular rate and rhythm; no murmurs, clicks, rubs,  or gallops. Abdomen: Non-distended, normal bowel sounds.  Soft and nontender without appreciable mass or hepatosplenomegaly.  Pulses:  Normal pulses noted. Extremities:  Without clubbing or edema.  Impression/Plan: Pleasant 84 year old man with recurrent esophageal dysphagia in the setting  of recurrent Schatzki's ring.  GERD symptoms well controlled.  EGD with esophageal dilation indicated to treat.  Recommendations:  I have offered the patient an EGD with esophageal dilation as feasible/appropriate utilizing propofol.  The risks, benefits, limitations, alternatives and imponderables have been reviewed with the patient. Potential for esophageal dilation, biopsy, etc. have also been reviewed.  Questions have been answered. All parties agreeable.  Further recommendations to follow.     Notice: This dictation was prepared with Dragon dictation along with smaller phrase technology. Any transcriptional errors that result from this process are unintentional and may not be corrected upon review.

## 2018-04-17 NOTE — Telephone Encounter (Signed)
Spoke with patient and is aware pre-op scheduled for 04/20/18 at 1:00pm. Nothing further needed

## 2018-04-17 NOTE — Patient Instructions (Signed)
   Schedule an EGD with esophageal dilation (recurrent esophageal dysphagia) propofol-ASAP  Continue Protonix 40 mg daily (dispense 90 with 3 refills)  Further recommendations to follow.

## 2018-04-17 NOTE — H&P (View-Only) (Signed)
Primary Care Physician:  Redmond School, MD Primary Gastroenterologist:  Dr. Gala Romney  Pre-Procedure History & Physical: HPI:  Peter [name] is a 84 y.o. male here for recurrent esophageal dysphagia.  He's had his esophagus dilated multiple times;   history of Schatzki's ring  / somewhat diffusely narrowed esophagus.  Biopsies negative for EOE.  Takes Protonix 40 mg daily with good control of his reflux symptoms.  He has had a left carotid endarterectomy last EGD.  Past Medical History:  Diagnosis Date  . Anxiety   . Arthritis   . BPH (benign prostatic hyperplasia)   . Chronic pain   . Complication of anesthesia    had an anxiety attack once  . Depression   . GERD (gastroesophageal reflux disease)   . Hiatal hernia    small  . History of nuclear stress test    Nuc 5/19: soft tissue attenuation; no ischemia or scar, EF 72; Low Risk  . HTN (hypertension)   . Internal carotid artery stenosis, left   . Prostate disease    h/o elevated PSA, neg bx, [facility]  . Sciatica   . Wears glasses   . Wears partial dentures     Past Surgical History:  Procedure Laterality Date  . ABDOMINAL AORTOGRAM W/LOWER EXTREMITY N/A 10/26/2017   Procedure: ABDOMINAL AORTOGRAM W/LOWER EXTREMITY;  Surgeon: Waynetta Sandy, MD;  Location: [facility];  Service: Cardiovascular;  Laterality: N/A;  bilateral  . BIOPSY  01/29/2014   Procedure: BIOPSY;  Surgeon: Daneil Dolin, MD;  [facility];  Service: Endoscopy;;  Gastric and Esophageal  . CAROTID ENDARTERECTOMY Left 03/20/2017  . CATARACT EXTRACTION W/PHACO  05/21/2012   Procedure: CATARACT EXTRACTION PHACO AND INTRAOCULAR LENS PLACEMENT (IOC);  Surgeon: Tonny Branch, MD;  Location: [facility];  Service: Ophthalmology;  Laterality: Right;  CDE 19.34  . COLONOSCOPY  12/2004   diverticulosis, hyperplastic polyps, hemorrhoids  . ENDARTERECTOMY Left 03/20/2017   Procedure: ENDARTERECTOMY CAROTID-LEFT;  Surgeon: Conrad Seneca,  MD;  Location: [facility];  Service: Vascular;  Laterality: Left;  . ESOPHAGOGASTRODUODENOSCOPY  2003   schatzki ring, small vascular abnormality of duodenal bulb ?Dieulafoy's lesion (ablated)  . ESOPHAGOGASTRODUODENOSCOPY  11/10/10   Dr. Gala Romney dilation 2 rings with 33F  . ESOPHAGOGASTRODUODENOSCOPY N/A 01/29/2014   Dr.Cassaundra Rasch- multiple esophageal rings/webs- s/p dialation. small hiatal hernia. abnormal gastric mucosa. bx= mild chronic inactive gastritis (oxyntic mucosa), squamous mucosa with epithelial changes consistent with reflux related injury.  Marland Kitchen MULTIPLE TOOTH EXTRACTIONS    . PATCH ANGIOPLASTY Left 03/20/2017  . PATCH ANGIOPLASTY Left 03/20/2017   Procedure: PATCH ANGIOPLASTY;  Surgeon: Conrad Guayanilla, MD;  Location: [facility];  Service: Vascular;  Laterality: Left;    Prior to Admission medications   Medication Sig Start Date End Date Taking? Authorizing Provider  ALPRAZolam Duanne Moron) 0.5 MG tablet Take 0.5 mg by mouth at bedtime.    Yes [provider]  amLODipine (NORVASC) 5 MG tablet Take 5 mg by mouth daily.  11/28/16  Yes [provider]  aspirin EC 81 MG tablet Take 81 mg by mouth daily.    Yes [provider]  atorvastatin (LIPITOR) 40 MG tablet TAKE (1) TABLET BY MOUTH ONCE DAILY. 12/20/17  Yes Fay Records, MD  finasteride (PROSCAR) 5 MG tablet Take 5 mg by mouth daily with lunch.    Yes [provider]  HYDROcodone-acetaminophen (NORCO/VICODIN) 5-325 MG per tablet Take 1 tablet by mouth daily as needed for moderate  pain.   Yes [provider]  lactulose (CHRONULAC) 10 GM/15ML solution TAKE TWO TABLESPOONFULS AT BEDTIME AS NEEDED. 02/07/18  Yes Annitta Needs, NP  lisinopril (PRINIVIL,ZESTRIL) 40 MG tablet Take 40 mg by mouth daily.  12/08/16  Yes [provider]  pantoprazole (PROTONIX) 40 MG tablet Take 1 tablet (40 mg total) by mouth daily before breakfast. 04/11/17  Yes Mahala Menghini, PA-C    Allergies as of 04/17/2018 - Review  Complete 04/17/2018  Allergen Reaction Noted  . Penicillins Rash 05/17/2012    Family History  Problem Relation Age of Onset  . Stroke Father 101       deceased  . Heart attack Mother   . Colon cancer Neg Hx     Social History   Socioeconomic History  . Marital status: Widowed    Spouse name: Not on file  . Number of children: 1  . Years of education: Not on file  . Highest education level: Not on file  Occupational History  . Occupation: retired    Comment: Orthoptist  Social Needs  . Financial resource strain: Not on file  . Food insecurity:    Worry: Not on file    Inability: Not on file  . Transportation needs:    Medical: Not on file    Non-medical: Not on file  Tobacco Use  . Smoking status: Current Every Day Smoker    Packs/day: 1.00    Years: 60.00    Pack years: 60.00    Types: Cigarettes    Last attempt to quit: 03/18/2017    Years since quitting: 1.0  . Smokeless tobacco: Never Used  Substance and Sexual Activity  . Alcohol use: No    Comment: quit 10  years ago  . Drug use: No  . Sexual activity: Not Currently    Partners: Female    Birth control/protection: None    Comment: spouse, has to use viagra  Lifestyle  . Physical activity:    Days per week: Not on file    Minutes per session: Not on file  . Stress: Not on file  Relationships  . Social connections:    Talks on phone: Not on file    Gets together: Not on file    Attends religious service: Not on file    Active member of club or organization: Not on file    Attends meetings of clubs or organizations: Not on file    Relationship status: Not on file  . Intimate partner violence:    Fear of current or ex partner: Not on file    Emotionally abused: Not on file    Physically abused: Not on file    Forced sexual activity: Not on file  Other Topics Concern  . Not on file  Social History Narrative  . Not on file    Review of Systems: See HPI, otherwise negative ROS  Physical Exam: BP (!)  151/65   Pulse 87   Temp (!) 97 F (36.1 C) (Oral)   Ht 5\' 10"  (1.778 m)   Wt 155 lb 12.8 oz (70.7 kg)   BMI 22.35 kg/m  General: Somewhat frail, pleasant and cooperative in NAD Heart:  Regular rate and rhythm; no murmurs, clicks, rubs,  or gallops. Abdomen: Non-distended, normal bowel sounds.  Soft and nontender without appreciable mass or hepatosplenomegaly.  Pulses:  Normal pulses noted. Extremities:  Without clubbing or edema.  Impression/Plan: Pleasant 84 year old man with recurrent esophageal dysphagia in the setting  of recurrent Schatzki's ring.  GERD symptoms well controlled.  EGD with esophageal dilation indicated to treat.  Recommendations:  I have offered the patient an EGD with esophageal dilation as feasible/appropriate utilizing propofol.  The risks, benefits, limitations, alternatives and imponderables have been reviewed with the patient. Potential for esophageal dilation, biopsy, etc. have also been reviewed.  Questions have been answered. All parties agreeable.  Further recommendations to follow.     Notice: This dictation was prepared with Dragon dictation along with smaller phrase technology. Any transcriptional errors that result from this process are unintentional and may not be corrected upon review.

## 2018-04-18 NOTE — Patient Instructions (Signed)
Peter [name]  04/18/2018     @PREFPERIOPPHARMACY @   Your procedure is scheduled on  04/23/2018 .  Report to [facility] at  1230   P.M.  Call this number if you have problems the morning of surgery:  [phone]   Remember:  Do not eat or drink after midnight.  You may drink clear liquids until  ( follow the instructions given to you) .  Clear liquids allowed are:                    Water, Juice (non-citric and without pulp), Carbonated beverages, Clear Tea, Black Coffee only, Plain Jell-O only, Gatorade and Plain Popsicles only    Take these medicines the morning of surgery with A SIP OF WATER Xanax ( if needed), amlodipine, hydrocodone ( if needed), lisinopril, protonix.    Do not wear jewelry, make-up or nail polish.  Do not wear lotions, powders, or perfumes, or deodorant.  Do not shave 48 hours prior to surgery.  Men may shave face and neck.  Do not bring valuables to the hospital.  [facility] : [facility] is not responsible for any belongings or valuables.  Contacts, dentures or bridgework may not be worn into surgery.  Leave your suitcase in the car.  After surgery it may be brought to your room.  For patients admitted to the hospital, discharge time will be determined by your treatment team.  Patients discharged the day of surgery will not be allowed to drive home.   Name and phone number of your driver:   family Special instructions:  Follow the diet instructions given to you by Dr Roseanne Kaufman office.  Please read over the following fact sheets that you were given. Anesthesia Post-op Instructions and Care and Recovery After Surgery       Esophagogastroduodenoscopy Esophagogastroduodenoscopy (EGD) is a procedure to examine the lining of the esophagus, stomach, and first part of the small intestine (duodenum). This procedure is done to check for problems such as inflammation, bleeding, ulcers, or growths. During this procedure, a long, flexible, lighted tube  with a camera attached (endoscope) is inserted down the throat. Tell a health care provider about:  Any allergies you have.  All medicines you are taking, including vitamins, herbs, eye drops, creams, and over-the-counter medicines.  Any problems you or family members have had with anesthetic medicines.  Any blood disorders you have.  Any surgeries you have had.  Any medical conditions you have.  Whether you are pregnant or may be pregnant. What are the risks? Generally, this is a safe procedure. However, problems may occur, including:  Infection.  Bleeding.  A tear (perforation) in the esophagus, stomach, or duodenum.  Trouble breathing.  Excessive sweating.  Spasms of the larynx.  A slowed heartbeat.  Low blood pressure.  What happens before the procedure?  Follow instructions from your health care provider about eating or drinking restrictions.  Ask your health care provider about: ? Changing or stopping your regular medicines. This is especially important if you are taking diabetes medicines or blood thinners. ? Taking medicines such as aspirin and ibuprofen. These medicines can thin your blood. Do not take these medicines before your procedure if your health care provider instructs you not to.  Plan to have someone take you home after the procedure.  If you wear dentures, be ready to remove them before the procedure. What happens during the procedure?  To reduce  your risk of infection, your health care team will wash or sanitize their hands.  An IV tube will be put in a vein in your hand or arm. You will get medicines and fluids through this tube.  You will be given one or more of the following: ? A medicine to help you relax (sedative). ? A medicine to numb the area (local anesthetic). This medicine may be sprayed into your throat. It will make you feel more comfortable and keep you from gagging or coughing during the procedure. ? A medicine for pain.  A  mouth guard may be placed in your mouth to protect your teeth and to keep you from biting on the endoscope.  You will be asked to lie on your left side.  The endoscope will be lowered down your throat into your esophagus, stomach, and duodenum.  Air will be put into the endoscope. This will help your health care provider see better.  The lining of your esophagus, stomach, and duodenum will be examined.  Your health care provider may: ? Take a tissue sample so it can be looked at in a lab (biopsy). ? Remove growths. ? Remove objects (foreign bodies) that are stuck. ? Treat any bleeding with medicines or other devices that stop tissue from bleeding. ? Widen (dilate) or stretch narrowed areas of your esophagus and stomach.  The endoscope will be taken out. The procedure may vary among health care providers and hospitals. What happens after the procedure?  Your blood pressure, heart rate, breathing rate, and blood oxygen level will be monitored often until the medicines you were given have worn off.  Do not eat or drink anything until the numbing medicine has worn off and your gag reflex has returned. This information is not intended to replace advice given to you by your health care provider. Make sure you discuss any questions you have with your health care provider. Document Released: 10/28/2004 Document Revised: 12/03/2015 Document Reviewed: 05/21/2015 Elsevier Interactive Patient Education  2018 [employer]. Esophagogastroduodenoscopy, Care After Refer to this sheet in the next few weeks. These instructions provide you with information about caring for yourself after your procedure. Your health care provider may also give you more specific instructions. Your treatment has been planned according to current medical practices, but problems sometimes occur. Call your health care provider if you have any problems or questions after your procedure. What can I expect after the  procedure? After the procedure, it is common to have:  A sore throat.  Nausea.  Bloating.  Dizziness.  Fatigue.  Follow these instructions at home:  Do not eat or drink anything until the numbing medicine (local anesthetic) has worn off and your gag reflex has returned. You will know that the local anesthetic has worn off when you can swallow comfortably.  Do not drive for 24 hours if you received a medicine to help you relax (sedative).  If your health care provider took a tissue sample for testing during the procedure, make sure to get your test results. This is your responsibility. Ask your health care provider or the department performing the test when your results will be ready.  Keep all follow-up visits as told by your health care provider. This is important. Contact a health care provider if:  You cannot stop coughing.  You are not urinating.  You are urinating less than usual. Get help right away if:  You have trouble swallowing.  You cannot eat or drink.  You have throat  or chest pain that gets worse.  You are dizzy or light-headed.  You faint.  You have nausea or vomiting.  You have chills.  You have a fever.  You have severe abdominal pain.  You have black, tarry, or bloody stools. This information is not intended to replace advice given to you by your health care provider. Make sure you discuss any questions you have with your health care provider. Document Released: 06/13/2012 Document Revised: 12/03/2015 Document Reviewed: 05/21/2015 Elsevier Interactive Patient Education  2018 [employer].  Esophageal Dilatation Esophageal dilatation is a procedure to open a blocked or narrowed part of the esophagus. The esophagus is the long tube in your throat that carries food and liquid from your mouth to your stomach. The procedure is also called esophageal dilation. You may need this procedure if you have a buildup of scar tissue in your esophagus that  makes it difficult, painful, or even impossible to swallow. This can be caused by gastroesophageal reflux disease (GERD). In rare cases, people need this procedure because they have cancer of the esophagus or a problem with the way food moves through the esophagus. Sometimes you may need to have another dilatation to enlarge the opening of the esophagus gradually. Tell a health care provider about:  Any allergies you have.  All medicines you are taking, including vitamins, herbs, eye drops, creams, and over-the-counter medicines.  Any problems you or family members have had with anesthetic medicines.  Any blood disorders you have.  Any surgeries you have had.  Any medical conditions you have.  Any antibiotic medicines you are required to take before dental procedures. What are the risks? Generally, this is a safe procedure. However, problems can occur and include:  Bleeding from a tear in the lining of the esophagus.  A hole (perforation) in the esophagus.  What happens before the procedure?  Do not eat or drink anything after midnight on the night before the procedure or as directed by your health care provider.  Ask your health care provider about changing or stopping your regular medicines. This is especially important if you are taking diabetes medicines or blood thinners.  Plan to have someone take you home after the procedure. What happens during the procedure?  You will be given a medicine that makes you relaxed and sleepy (sedative).  A medicine may be sprayed or gargled to numb the back of the throat.  Your health care provider can use various instruments to do an esophageal dilatation. During the procedure, the instrument used will be placed in your mouth and passed down into your esophagus. Options include: ? Simple dilators. This instrument is carefully placed in the esophagus to stretch it. ? Guided wire bougies. In this method, a flexible tube (endoscope) is used  to insert a wire into the esophagus. The dilator is passed over this wire to enlarge the esophagus. Then the wire is removed. ? Balloon dilators. An endoscope with a small balloon at the end is passed down into the esophagus. Inflating the balloon gently stretches the esophagus and opens it up. What happens after the procedure?  Your blood pressure, heart rate, breathing rate, and blood oxygen level will be monitored often until the medicines you were given have worn off.  Your throat may feel slightly sore and will probably still feel numb. This will improve slowly over time.  You will not be allowed to eat or drink until the throat numbness has resolved.  If this is a same-day procedure,  you may be allowed to go home once you have been able to drink, urinate, and sit on the edge of the bed without nausea or dizziness.  If this is a same-day procedure, you should have a friend or family member with you for the next 24 hours after the procedure. This information is not intended to replace advice given to you by your health care provider. Make sure you discuss any questions you have with your health care provider. Document Released: 08/18/2005 Document Revised: 12/03/2015 Document Reviewed: 11/06/2013 Elsevier Interactive Patient Education  2018 [employer] Anesthesia is a term that refers to techniques, procedures, and medicines that help a person stay safe and comfortable during a medical procedure. Monitored anesthesia care, or sedation, is one type of anesthesia. Your anesthesia specialist may recommend sedation if you will be having a procedure that does not require you to be unconscious, such as:  Cataract surgery.  A dental procedure.  A biopsy.  A colonoscopy.  During the procedure, you may receive a medicine to help you relax (sedative). There are three levels of sedation:  Mild sedation. At this level, you may feel awake and relaxed. You will be  able to follow directions.  Moderate sedation. At this level, you will be sleepy. You may not remember the procedure.  Deep sedation. At this level, you will be asleep. You will not remember the procedure.  The more medicine you are given, the deeper your level of sedation will be. Depending on how you respond to the procedure, the anesthesia specialist may change your level of sedation or the type of anesthesia to fit your needs. An anesthesia specialist will monitor you closely during the procedure. Let your health care provider know about:  Any allergies you have.  All medicines you are taking, including vitamins, herbs, eye drops, creams, and over-the-counter medicines.  Any use of steroids (by mouth or as a cream).  Any problems you or family members have had with sedatives and anesthetic medicines.  Any blood disorders you have.  Any surgeries you have had.  Any medical conditions you have, such as sleep apnea.  Whether you are pregnant or may be pregnant.  Any use of cigarettes, alcohol, or street drugs. What are the risks? Generally, this is a safe procedure. However, problems may occur, including:  Getting too much medicine (oversedation).  Nausea.  Allergic reaction to medicines.  Trouble breathing. If this happens, a breathing tube may be used to help with breathing. It will be removed when you are awake and breathing on your own.  Heart trouble.  Lung trouble.  Before the procedure Staying hydrated Follow instructions from your health care provider about hydration, which may include:  Up to 2 hours before the procedure - you may continue to drink clear liquids, such as water, clear fruit juice, black coffee, and plain tea.  Eating and drinking restrictions Follow instructions from your health care provider about eating and drinking, which may include:  8 hours before the procedure - stop eating heavy meals or foods such as meat, fried foods, or fatty  foods.  6 hours before the procedure - stop eating light meals or foods, such as toast or cereal.  6 hours before the procedure - stop drinking milk or drinks that contain milk.  2 hours before the procedure - stop drinking clear liquids.  Medicines Ask your health care provider about:  Changing or stopping your regular medicines. This is especially important if you  are taking diabetes medicines or blood thinners.  Taking medicines such as aspirin and ibuprofen. These medicines can thin your blood. Do not take these medicines before your procedure if your health care provider instructs you not to.  Tests and exams  You will have a physical exam.  You may have blood tests done to show: ? How well your kidneys and liver are working. ? How well your blood can clot.  General instructions  Plan to have someone take you home from the hospital or clinic.  If you will be going home right after the procedure, plan to have someone with you for 24 hours.  What happens during the procedure?  Your blood pressure, heart rate, breathing, level of pain and overall condition will be monitored.  An IV tube will be inserted into one of your veins.  Your anesthesia specialist will give you medicines as needed to keep you comfortable during the procedure. This may mean changing the level of sedation.  The procedure will be performed. After the procedure  Your blood pressure, heart rate, breathing rate, and blood oxygen level will be monitored until the medicines you were given have worn off.  Do not drive for 24 hours if you received a sedative.  You may: ? Feel sleepy, clumsy, or nauseous. ? Feel forgetful about what happened after the procedure. ? Have a sore throat if you had a breathing tube during the procedure. ? Vomit. This information is not intended to replace advice given to you by your health care provider. Make sure you discuss any questions you have with your health care  provider. Document Released: 03/23/2005 Document Revised: 12/04/2015 Document Reviewed: 10/18/2015 Elsevier Interactive Patient Education  2018 [place], Care After These instructions provide you with information about caring for yourself after your procedure. Your health care provider may also give you more specific instructions. Your treatment has been planned according to current medical practices, but problems sometimes occur. Call your health care provider if you have any problems or questions after your procedure. What can I expect after the procedure? After your procedure, it is common to:  Feel sleepy for several hours.  Feel clumsy and have poor balance for several hours.  Feel forgetful about what happened after the procedure.  Have poor judgment for several hours.  Feel nauseous or vomit.  Have a sore throat if you had a breathing tube during the procedure.  Follow these instructions at home: For at least 24 hours after the procedure:   Do not: ? Participate in activities in which you could fall or become injured. ? Drive. ? Use heavy machinery. ? Drink alcohol. ? Take sleeping pills or medicines that cause drowsiness. ? Make important decisions or sign legal documents. ? Take care of children on your own.  Rest. Eating and drinking  Follow the diet that is recommended by your health care provider.  If you vomit, drink water, juice, or soup when you can drink without vomiting.  Make sure you have little or no nausea before eating solid foods. General instructions  Have a responsible adult stay with you until you are awake and alert.  Take over-the-counter and prescription medicines only as told by your health care provider.  If you smoke, do not smoke without supervision.  Keep all follow-up visits as told by your health care provider. This is important. Contact a health care provider if:  You keep feeling nauseous or you  keep vomiting.  You feel  light-headed.  You develop a rash.  You have a fever. Get help right away if:  You have trouble breathing. This information is not intended to replace advice given to you by your health care provider. Make sure you discuss any questions you have with your health care provider. Document Released: 10/18/2015 Document Revised: 02/17/2016 Document Reviewed: 10/18/2015 Elsevier Interactive Patient Education  [employer].

## 2018-04-20 ENCOUNTER — Other Ambulatory Visit: Payer: Self-pay

## 2018-04-20 ENCOUNTER — Encounter (HOSPITAL_COMMUNITY)
Admission: RE | Admit: 2018-04-20 | Discharge: 2018-04-20 | Disposition: A | Discharge status: Home / Self Care | Payer: Medicare Other | Source: Ambulatory Visit | Attending: Internal Medicine | Admitting: Internal Medicine

## 2018-04-20 ENCOUNTER — Encounter (HOSPITAL_COMMUNITY): Payer: Self-pay

## 2018-04-20 DIAGNOSIS — Z01818 Encounter for other preprocedural examination: Secondary | ICD-10-CM | POA: Diagnosis not present

## 2018-04-20 DIAGNOSIS — R1310 Dysphagia, unspecified: Secondary | ICD-10-CM | POA: Insufficient documentation

## 2018-04-20 LAB — BASIC METABOLIC PANEL
ANION GAP: 9 (ref 5–15)
BUN: 9 mg/dL (ref 8–23)
CHLORIDE: 96 mmol/L — AB (ref 98–111)
CO2: 24 mmol/L (ref 22–32)
Calcium: 9.2 mg/dL (ref 8.9–10.3)
Creatinine, Ser: 0.71 mg/dL (ref 0.61–1.24)
GFR calc Af Amer: >60 mL/min (ref >60)
GFR calc non Af Amer: >60 mL/min (ref >60)
GLUCOSE: 103 mg/dL — AB (ref 70–99)
POTASSIUM: 4.4 mmol/L (ref 3.5–5.1)
Sodium: 129 mmol/L — ABNORMAL LOW (ref 135–145)

## 2018-04-20 LAB — CBC WITH DIFFERENTIAL/PLATELET
ABS IMMATURE GRANULOCYTES: 0.02 10*3/uL (ref 0.00–0.07)
BASOS PCT: 1 %
Basophils Absolute: 0.1 10*3/uL (ref 0.0–0.1)
Eosinophils Absolute: 0.5 10*3/uL (ref 0.0–0.5)
Eosinophils Relative: 4 %
HCT: 43.5 % (ref 39.0–52.0)
HEMOGLOBIN: 14.6 g/dL (ref 13.0–17.0)
IMMATURE GRANULOCYTES: 0 %
LYMPHS PCT: 23 %
Lymphs Abs: 2.5 10*3/uL (ref 0.7–4.0)
MCH: 31.7 pg (ref 26.0–34.0)
MCHC: 33.6 g/dL (ref 30.0–36.0)
MCV: 94.6 fL (ref 80.0–100.0)
MONO ABS: 1.2 10*3/uL — AB (ref 0.1–1.0)
MONOS PCT: 11 %
NEUTROS ABS: 6.6 10*3/uL (ref 1.7–7.7)
NEUTROS PCT: 61 %
PLATELETS: 367 10*3/uL (ref 150–400)
RBC: 4.6 MIL/uL (ref 4.22–5.81)
RDW: 11.9 % (ref 11.5–15.5)
WBC: 10.9 10*3/uL — ABNORMAL HIGH (ref 4.0–10.5)
nRBC: 0 % (ref 0.0–0.2)

## 2018-04-23 ENCOUNTER — Ambulatory Visit (HOSPITAL_COMMUNITY): Payer: Medicare Other | Admitting: Anesthesiology

## 2018-04-23 ENCOUNTER — Ambulatory Visit (HOSPITAL_COMMUNITY)
Admission: RE | Admit: 2018-04-23 | Discharge: 2018-04-23 | Disposition: A | Discharge status: Home / Self Care | Payer: Medicare Other | Source: Ambulatory Visit | Attending: Internal Medicine | Admitting: Internal Medicine

## 2018-04-23 ENCOUNTER — Encounter (HOSPITAL_COMMUNITY): Payer: Self-pay | Admitting: *Deleted

## 2018-04-23 ENCOUNTER — Other Ambulatory Visit: Payer: Self-pay

## 2018-04-23 ENCOUNTER — Telehealth: Payer: Self-pay

## 2018-04-23 ENCOUNTER — Encounter (HOSPITAL_COMMUNITY)
Admission: RE | Disposition: A | Discharge status: Home / Self Care | Payer: Self-pay | Source: Ambulatory Visit | Attending: Internal Medicine

## 2018-04-23 DIAGNOSIS — R1314 Dysphagia, pharyngoesophageal phase: Secondary | ICD-10-CM | POA: Insufficient documentation

## 2018-04-23 DIAGNOSIS — F329 Major depressive disorder, single episode, unspecified: Secondary | ICD-10-CM | POA: Diagnosis not present

## 2018-04-23 DIAGNOSIS — I10 Essential (primary) hypertension: Secondary | ICD-10-CM | POA: Insufficient documentation

## 2018-04-23 DIAGNOSIS — F419 Anxiety disorder, unspecified: Secondary | ICD-10-CM | POA: Diagnosis not present

## 2018-04-23 DIAGNOSIS — N400 Enlarged prostate without lower urinary tract symptoms: Secondary | ICD-10-CM | POA: Diagnosis not present

## 2018-04-23 DIAGNOSIS — G8929 Other chronic pain: Secondary | ICD-10-CM | POA: Diagnosis not present

## 2018-04-23 DIAGNOSIS — K222 Esophageal obstruction: Secondary | ICD-10-CM | POA: Insufficient documentation

## 2018-04-23 DIAGNOSIS — I739 Peripheral vascular disease, unspecified: Secondary | ICD-10-CM | POA: Diagnosis not present

## 2018-04-23 DIAGNOSIS — F17210 Nicotine dependence, cigarettes, uncomplicated: Secondary | ICD-10-CM | POA: Diagnosis not present

## 2018-04-23 DIAGNOSIS — Z79891 Long term (current) use of opiate analgesic: Secondary | ICD-10-CM | POA: Diagnosis not present

## 2018-04-23 DIAGNOSIS — Z79899 Other long term (current) drug therapy: Secondary | ICD-10-CM | POA: Insufficient documentation

## 2018-04-23 DIAGNOSIS — K449 Diaphragmatic hernia without obstruction or gangrene: Secondary | ICD-10-CM | POA: Diagnosis not present

## 2018-04-23 DIAGNOSIS — Z7982 Long term (current) use of aspirin: Secondary | ICD-10-CM | POA: Insufficient documentation

## 2018-04-23 DIAGNOSIS — K219 Gastro-esophageal reflux disease without esophagitis: Secondary | ICD-10-CM | POA: Diagnosis not present

## 2018-04-23 DIAGNOSIS — R1310 Dysphagia, unspecified: Secondary | ICD-10-CM | POA: Diagnosis not present

## 2018-04-23 HISTORY — PX: ESOPHAGOGASTRODUODENOSCOPY (EGD) WITH PROPOFOL: SHX5813

## 2018-04-23 HISTORY — PX: BALLOON DILATION: SHX5330

## 2018-04-23 SURGERY — ESOPHAGOGASTRODUODENOSCOPY (EGD) WITH PROPOFOL
Anesthesia: Monitor Anesthesia Care

## 2018-04-23 MED ORDER — LABETALOL HCL 5 MG/ML IV SOLN
INTRAVENOUS | Status: AC
  Filled 2018-04-23: qty 4

## 2018-04-23 MED ORDER — PROPOFOL 500 MG/50ML IV EMUL
INTRAVENOUS | Status: DC | PRN
  Administered 2018-04-23: 150 ug/kg/min via INTRAVENOUS

## 2018-04-23 MED ORDER — LACTATED RINGERS IV SOLN
INTRAVENOUS | Status: DC | PRN
  Administered 2018-04-23: 14:00:00 via INTRAVENOUS

## 2018-04-23 MED ORDER — PROPOFOL 10 MG/ML IV BOLUS
INTRAVENOUS | Status: AC
  Filled 2018-04-23: qty 60

## 2018-04-23 NOTE — Discharge Instructions (Signed)
EGD Discharge instructions Please read the instructions outlined below and refer to this sheet in the next few weeks. These discharge instructions provide you with general information on caring for yourself after you leave the hospital. Your doctor may also give you specific instructions. While your treatment has been planned according to the most current medical practices available, unavoidable complications occasionally occur. If you have any problems or questions after discharge, please call your doctor. ACTIVITY  You may resume your regular activity but move at a slower pace for the next 24 hours.   Take frequent rest periods for the next 24 hours.   Walking will help expel (get rid of) the air and reduce the bloated feeling in your abdomen.   No driving for 24 hours (because of the anesthesia (medicine) used during the test).   You may shower.   Do not sign any important legal documents or operate any machinery for 24 hours (because of the anesthesia used during the test).  NUTRITION  Drink plenty of fluids.   You may resume your normal diet.   Begin with a light meal and progress to your normal diet.   Avoid alcoholic beverages for 24 hours or as instructed by your caregiver.  MEDICATIONS  You may resume your normal medications unless your caregiver tells you otherwise.  WHAT YOU CAN EXPECT TODAY  You may experience abdominal discomfort such as a feeling of fullness or gas pains.  FOLLOW-UP  Your doctor will discuss the results of your test with you.  SEEK IMMEDIATE MEDICAL ATTENTION IF ANY OF THE FOLLOWING OCCUR:  Excessive nausea (feeling sick to your stomach) and/or vomiting.   Severe abdominal pain and distention (swelling).   Trouble swallowing.   Temperature over 101 F (37.8 C).   Rectal bleeding or vomiting of blood.    GERD information provided  Information on dysphagia 2 diet provided  Continue Protonix 40 mg daily  Add Carafate suspension 1 g 4  times daily x5 days  Repeat EGD with dilation under propofol 6 weeks

## 2018-04-23 NOTE — Anesthesia Preprocedure Evaluation (Signed)
Anesthesia Evaluation  Patient identified by MRN, date of birth, ID band Patient awake    Reviewed: Allergy & Precautions, H&P , NPO status , Patient's Chart, lab work & pertinent test results, reviewed documented beta blocker date and time   History of Anesthesia Complications (+) history of anesthetic complications  Airway Mallampati: II  TM Distance: >3 FB Neck ROM: full    Dental  (+) Edentulous Upper, Edentulous Lower   Pulmonary neg pulmonary ROS, Current Smoker,    Pulmonary exam normal breath sounds clear to auscultation       Cardiovascular Exercise Tolerance: Good hypertension, + Peripheral Vascular Disease  negative cardio ROS   Rhythm:regular Rate:Normal     Neuro/Psych PSYCHIATRIC DISORDERS Anxiety Depression  Neuromuscular disease negative neurological ROS  negative psych ROS   GI/Hepatic negative GI ROS, Neg liver ROS, hiatal hernia, GERD  ,  Endo/Other  negative endocrine ROS  Renal/GU negative Renal ROS  negative genitourinary   Musculoskeletal  (+) Arthritis ,   Abdominal   Peds  Hematology negative hematology ROS (+)   Anesthesia Other Findings   Reproductive/Obstetrics negative OB ROS                             Anesthesia Physical Anesthesia Plan  ASA: III  Anesthesia Plan: MAC   Post-op Pain Management:    Induction:   PONV Risk Score and Plan:   Airway Management Planned:   Additional Equipment:   Intra-op Plan:   Post-operative Plan:   Informed Consent: I have reviewed the patients History and Physical, chart, labs and discussed the procedure including the risks, benefits and alternatives for the proposed anesthesia with the patient or authorized representative who has indicated his/her understanding and acceptance.     Plan Discussed with: CRNA  Anesthesia Plan Comments:         Anesthesia Quick Evaluation

## 2018-04-23 NOTE — Addendum Note (Signed)
Addendum  created 04/23/18 1434 by Charmaine Downs, CRNA   Intraprocedure Meds edited

## 2018-04-23 NOTE — Op Note (Signed)
[facility] Patient Name: Peter [name] Procedure Date: 04/23/2018 1:06 PM MRN: [id number] Date of Birth: January 24, 1933 Attending MD: Norvel Richards , MD CSN: [id number] Age: 84 Admit Type: Outpatient Procedure:                Upper GI endoscopy Indications:              Dysphagia Providers:                Norvel Richards, MD, Jeanann Lewandowsky. Sharon Seller, RN,                            Nelma Rothman, Technician Referring MD:             Redmond [employer], MD Medicines:                Propofol per Anesthesia Complications:            No immediate complications. Estimated Blood Loss:     Estimated blood loss was minimal. Procedure:                Pre-Anesthesia Assessment:                           - Prior to the procedure, a History and Physical                            was performed, and patient medications and                            allergies were reviewed. The patient's tolerance of                            previous anesthesia was also reviewed. The risks                            and benefits of the procedure and the sedation                            options and risks were discussed with the patient.                            All questions were answered, and informed consent                            was obtained. Prior Anticoagulants: The patient has                            taken no previous anticoagulant or antiplatelet                            agents. ASA Grade Assessment: II - A patient with                            mild systemic disease. After reviewing the risks  and benefits, the patient was deemed in                            satisfactory condition to undergo the procedure.                           After obtaining informed consent, the endoscope was                            passed under direct vision. Throughout the                            procedure, the patient's blood pressure, pulse, and                            oxygen  saturations were monitored continuously. The                            GIF-H190 ([id number]) scope was introduced through the                            and advanced to the second part of duodenum. The                            upper GI endoscopy was accomplished without                            difficulty. The patient tolerated the procedure                            well. Scope In: 2:16:46 PM Scope Out: 2:25:03 PM Total Procedure Duration: 0 hours 8 minutes 17 seconds  Findings:      A obstructing Schatzki ring was found at the gastroesophageal junction.       Applying gentle pressure on the scope allowed for scope advancement. The       scope popped through the ring. Examination of the stomach revealed a       hiatal hernia only.      A medium-sized hiatal hernia was present.      The exam was otherwise without abnormality.      The duodenal bulb and second portion of the duodenum were normal. TTS       balloon was placed across the ring and inflated to 13.5 mm x 1 minute       taken down. This was associated with additional dilation of the ring.       The ring still produced moderate stenosis. I subsequently took       four-quadrant biopsy bites with cold biopsy forceps to disrupt it       additionally. This was done without difficulty or apparent complication. Impression:               - Obstructing Schatzki ring. Dilated and disrupted.                            Lumen remains narrowed but patent at the level of  the ring.                           - Medium-sized hiatal hernia.                           - The examination was otherwise normal.                           - Normal duodenal bulb and second portion of the                            duodenum.                           - No specimens collected. Moderate Sedation:      Moderate (conscious) sedation was personally administered by an       anesthesia professional. The following parameters were  monitored: oxygen       saturation, heart rate, blood pressure, respiratory rate, EKG, adequacy       of pulmonary ventilation, and response to care. Recommendation:           - Patient has a contact number available for                            emergencies. The signs and symptoms of potential                            delayed complications were discussed with the                            patient. Return to normal activities tomorrow.                            Written discharge instructions were provided to the                            patient.                           - Chopped diet for 6 weeks. Continue Protonix 40 mg                            daily. Carafate 1 g suspension 4 times daily x5                            days. Repeat EGD with dilation in 6 weeks Procedure Code(s):        --- Professional ---                           [phone], Esophagogastroduodenoscopy, flexible,                            transoral; diagnostic, including collection of  specimen(s) by brushing or washing, when performed                            (separate procedure) Diagnosis Code(s):        --- Professional ---                           K22.2, Esophageal obstruction                           K44.9, Diaphragmatic hernia without obstruction or                            gangrene                           R13.10, Dysphagia, unspecified CPT copyright 2018 American [employer] Association. All rights reserved. The codes documented in this report are preliminary and upon coder review may  be revised to meet current compliance requirements. Cristopher Estimable. Sharvi Mooneyhan, MD Norvel Richards, MD 04/23/2018 2:35:53 PM This report has been signed electronically. Number of Addenda: 0

## 2018-04-23 NOTE — Transfer of Care (Signed)
Immediate Anesthesia Transfer of Care Note  Patient: ZEUS MARQUIS  Procedure(s) Performed: ESOPHAGOGASTRODUODENOSCOPY (EGD) WITH PROPOFOL (N/A ) BALLOON DILATION  Patient Location: PACU  Anesthesia Type:MAC  Level of Consciousness: awake and patient cooperative  Airway & Oxygen Therapy: Patient Spontanous Breathing and Patient connected to nasal cannula oxygen  Post-op Assessment: Report given to RN, Post -op Vital signs reviewed and stable and Patient moving all extremities  Post vital signs: Reviewed and stable  Last Vitals:  Vitals Value Taken Time  BP    Temp    Pulse 76 04/23/2018  2:30 PM  Resp    SpO2 100 % 04/23/2018  2:30 PM  Vitals shown include unvalidated device data.  Last Pain:  Vitals:   04/23/18 1409  TempSrc:   PainSc: 0-No pain      Patients Stated Pain Goal: 5 (53/20/23 3435)  Complications: No apparent anesthesia complications

## 2018-04-23 NOTE — Anesthesia Postprocedure Evaluation (Signed)
Anesthesia Post Note  Patient: [name]  Procedure(s) Performed: ESOPHAGOGASTRODUODENOSCOPY (EGD) WITH PROPOFOL (N/A ) BALLOON DILATION  Patient location during evaluation: PACU Anesthesia Type: MAC Level of consciousness: awake and patient cooperative Pain management: pain level controlled Vital Signs Assessment: post-procedure vital signs reviewed and stable Respiratory status: spontaneous breathing, nonlabored ventilation and respiratory function stable Cardiovascular status: blood pressure returned to baseline Postop Assessment: no apparent nausea or vomiting Anesthetic complications: no     Last Vitals:  Vitals:   04/23/18 1336  BP: (!) 164/66  Pulse: 86  Resp: 19  Temp: 36.6 C  SpO2: 98%    Last Pain:  Vitals:   04/23/18 1409  TempSrc:   PainSc: 0-No pain                 Peter [name]

## 2018-04-23 NOTE — Telephone Encounter (Signed)
Pts daughter called due to pts Carafate medication being over $100.00. Are you ok with changing the Carafate suspension to pills so it can be more affordable for pt? Pt has a pill crusher. Please advise.

## 2018-04-23 NOTE — Interval H&P Note (Signed)
History and Physical Interval Note:  04/23/2018 1:55 PM  VEER ELAMIN  has presented today for surgery, with the diagnosis of recurrent esophageal dysphagia  The various methods of treatment have been discussed with the patient and family. After consideration of risks, benefits and other options for treatment, the patient has consented to  Procedure(s) with comments: ESOPHAGOGASTRODUODENOSCOPY (EGD) WITH PROPOFOL (N/A) - 2:00pm MALONEY DILATION (N/A) as a surgical intervention .  The patient's history has been reviewed, patient examined, no change in status, stable for surgery.  I have reviewed the patient's chart and labs.  Questions were answered to the patient's satisfaction.     Jousha Schwandt  No change.  EGD/ED per plan.  Limitations on dilation and potential for re-dilation and potential for this procedure not to be associated with resolution of all his dysphagia symptoms discussed with patient and daughters at length.  The risks, benefits, limitations, alternatives and imponderables have been reviewed with the patient. Potential for esophageal dilation, biopsy, etc. have also been reviewed.  Questions have been answered. All parties agreeable.

## 2018-04-24 ENCOUNTER — Telehealth: Payer: Self-pay | Admitting: Internal Medicine

## 2018-04-24 NOTE — Telephone Encounter (Signed)
Pt called again today asking about his prescription. He said it was too expensive. [phone]

## 2018-04-24 NOTE — Telephone Encounter (Signed)
Message was sent to [facility]. Waiting on a response.

## 2018-04-24 NOTE — Telephone Encounter (Signed)
The idea is to have Carafate in the liquid form to coat the esophagus.  Taking pills will not be helpful.  Carafate pills can be crushed up in water I believe.  Need to consult with the pharmacist.  I think they can be solved in water.  Need to check with the pharmacist to get the recommendations.

## 2018-04-24 NOTE — Telephone Encounter (Signed)
Spoke with the Pharamcist at [facility], he said pills can be dropped in water and they dissolve in less than 1 minute. They do that for a lot of the nursing home patients.

## 2018-04-27 ENCOUNTER — Telehealth: Payer: Self-pay

## 2018-04-27 NOTE — Telephone Encounter (Signed)
-----   Message from Daneil Dolin, MD sent at 04/23/2018  2:27 PM EDT ----- Needs repeat EGD with dilation under propofol in 6 weeks.  Okay to triage to get him back on the schedule

## 2018-04-27 NOTE — Telephone Encounter (Signed)
LMOVM

## 2018-04-27 NOTE — Telephone Encounter (Signed)
Please schedule pt

## 2018-04-30 ENCOUNTER — Other Ambulatory Visit: Payer: Self-pay | Admitting: *Deleted

## 2018-04-30 ENCOUNTER — Encounter (HOSPITAL_COMMUNITY): Payer: Self-pay | Admitting: Internal Medicine

## 2018-04-30 DIAGNOSIS — R1319 Other dysphagia: Secondary | ICD-10-CM

## 2018-04-30 DIAGNOSIS — R1310 Dysphagia, unspecified: Secondary | ICD-10-CM

## 2018-04-30 NOTE — Telephone Encounter (Signed)
Patient called back. He is scheduled for 06/14/18 at 10:00am. Instructions mailed to him. I will call back about pre-op appt.

## 2018-04-30 NOTE — Telephone Encounter (Signed)
LMOVM

## 2018-04-30 NOTE — Telephone Encounter (Signed)
Pre-op scheduled for 06/11/18 at 9:00am. Letter mailed. Patient aware.

## 2018-05-14 ENCOUNTER — Telehealth: Payer: Self-pay | Admitting: Internal Medicine

## 2018-05-14 ENCOUNTER — Other Ambulatory Visit: Payer: Self-pay | Admitting: Gastroenterology

## 2018-05-14 NOTE — Telephone Encounter (Signed)
Pt would like a refill of Lactose sent to [facility].

## 2018-05-14 NOTE — Telephone Encounter (Signed)
Pt said [facility] hasn't received anything from [place] about refilling his Lactose Rx.

## 2018-05-21 MED ORDER — LACTULOSE 10 GM/15ML PO SOLN: ORAL | 1 refills | Status: DC

## 2018-05-21 NOTE — Telephone Encounter (Signed)
Done

## 2018-05-21 NOTE — Addendum Note (Signed)
Addended by: Annitta Needs on: 05/21/2018 12:18 PM   Modules accepted: Orders

## 2018-06-05 DIAGNOSIS — R0781 Pleurodynia: Secondary | ICD-10-CM | POA: Diagnosis not present

## 2018-06-05 DIAGNOSIS — Z6823 Body mass index (BMI) 23.0-23.9, adult: Secondary | ICD-10-CM | POA: Diagnosis not present

## 2018-06-06 ENCOUNTER — Encounter (HOSPITAL_COMMUNITY): Payer: Self-pay

## 2018-06-11 ENCOUNTER — Encounter (HOSPITAL_COMMUNITY)
Admission: RE | Admit: 2018-06-11 | Discharge: 2018-06-11 | Disposition: A | Discharge status: Home / Self Care | Payer: Medicare Other | Source: Ambulatory Visit | Attending: Internal Medicine | Admitting: Internal Medicine

## 2018-06-13 MED ORDER — LACTATED RINGERS IV SOLN
INTRAVENOUS | Status: DC
  Administered 2018-06-14: 10:00:00 via INTRAVENOUS

## 2018-06-14 ENCOUNTER — Ambulatory Visit (HOSPITAL_COMMUNITY): Payer: Medicare Other | Admitting: Anesthesiology

## 2018-06-14 ENCOUNTER — Encounter (HOSPITAL_COMMUNITY): Payer: Self-pay | Admitting: *Deleted

## 2018-06-14 ENCOUNTER — Encounter (HOSPITAL_COMMUNITY)
Admission: RE | Disposition: A | Discharge status: Home / Self Care | Payer: Self-pay | Source: Ambulatory Visit | Attending: Internal Medicine

## 2018-06-14 ENCOUNTER — Ambulatory Visit (HOSPITAL_COMMUNITY)
Admission: RE | Admit: 2018-06-14 | Discharge: 2018-06-14 | Disposition: A | Discharge status: Home / Self Care | Payer: Medicare Other | Source: Ambulatory Visit | Attending: Internal Medicine | Admitting: Internal Medicine

## 2018-06-14 DIAGNOSIS — Z7982 Long term (current) use of aspirin: Secondary | ICD-10-CM | POA: Diagnosis not present

## 2018-06-14 DIAGNOSIS — Z79899 Other long term (current) drug therapy: Secondary | ICD-10-CM | POA: Diagnosis not present

## 2018-06-14 DIAGNOSIS — Z880 Allergy status to penicillin: Secondary | ICD-10-CM | POA: Diagnosis not present

## 2018-06-14 DIAGNOSIS — K219 Gastro-esophageal reflux disease without esophagitis: Secondary | ICD-10-CM | POA: Insufficient documentation

## 2018-06-14 DIAGNOSIS — R1310 Dysphagia, unspecified: Secondary | ICD-10-CM | POA: Diagnosis not present

## 2018-06-14 DIAGNOSIS — K222 Esophageal obstruction: Secondary | ICD-10-CM | POA: Insufficient documentation

## 2018-06-14 DIAGNOSIS — K449 Diaphragmatic hernia without obstruction or gangrene: Secondary | ICD-10-CM | POA: Insufficient documentation

## 2018-06-14 DIAGNOSIS — F418 Other specified anxiety disorders: Secondary | ICD-10-CM | POA: Insufficient documentation

## 2018-06-14 DIAGNOSIS — R1319 Other dysphagia: Secondary | ICD-10-CM

## 2018-06-14 DIAGNOSIS — I10 Essential (primary) hypertension: Secondary | ICD-10-CM | POA: Insufficient documentation

## 2018-06-14 DIAGNOSIS — F17210 Nicotine dependence, cigarettes, uncomplicated: Secondary | ICD-10-CM | POA: Diagnosis not present

## 2018-06-14 DIAGNOSIS — N400 Enlarged prostate without lower urinary tract symptoms: Secondary | ICD-10-CM | POA: Diagnosis not present

## 2018-06-14 HISTORY — PX: BIOPSY: SHX5522

## 2018-06-14 HISTORY — PX: MALONEY DILATION: SHX5535

## 2018-06-14 HISTORY — PX: ESOPHAGOGASTRODUODENOSCOPY (EGD) WITH PROPOFOL: SHX5813

## 2018-06-14 SURGERY — ESOPHAGOGASTRODUODENOSCOPY (EGD) WITH PROPOFOL
Anesthesia: Monitor Anesthesia Care

## 2018-06-14 MED ORDER — PROMETHAZINE HCL 25 MG/ML IJ SOLN: 6.2500 mg | INTRAMUSCULAR | Status: DC | PRN

## 2018-06-14 MED ORDER — PROPOFOL 500 MG/50ML IV EMUL
INTRAVENOUS | Status: DC | PRN
  Administered 2018-06-14: 150 ug/kg/min via INTRAVENOUS

## 2018-06-14 MED ORDER — PROPOFOL 10 MG/ML IV BOLUS
INTRAVENOUS | Status: DC | PRN
  Administered 2018-06-14: 20 mg via INTRAVENOUS

## 2018-06-14 MED ORDER — HYDROMORPHONE HCL 1 MG/ML IJ SOLN: 0.2500 mg | INTRAMUSCULAR | Status: DC | PRN

## 2018-06-14 MED ORDER — PROPOFOL 10 MG/ML IV BOLUS
INTRAVENOUS | Status: AC
  Filled 2018-06-14: qty 20

## 2018-06-14 MED ORDER — CHLORHEXIDINE GLUCONATE CLOTH 2 % EX PADS: 6.0000 | MEDICATED_PAD | Freq: Once | CUTANEOUS | Status: DC

## 2018-06-14 MED ORDER — LACTATED RINGERS IV SOLN: INTRAVENOUS | Status: DC

## 2018-06-14 MED ORDER — PROPOFOL 10 MG/ML IV BOLUS
INTRAVENOUS | Status: AC
  Filled 2018-06-14: qty 40

## 2018-06-14 MED ORDER — HYDROCODONE-ACETAMINOPHEN 7.5-325 MG PO TABS: 1.0000 | ORAL_TABLET | Freq: Once | ORAL | Status: DC | PRN

## 2018-06-14 MED ORDER — MEPERIDINE HCL 100 MG/ML IJ SOLN: 6.2500 mg | INTRAMUSCULAR | Status: DC | PRN

## 2018-06-14 MED ORDER — LIDOCAINE 2% (20 MG/ML) 5 ML SYRINGE
INTRAMUSCULAR | Status: AC
  Filled 2018-06-14: qty 5

## 2018-06-14 NOTE — Anesthesia Procedure Notes (Signed)
Procedure Name: MAC Date/Time: 06/14/2018 10:08 AM Performed by: Andree Elk Lynia Landry A, CRNA Pre-anesthesia Checklist: Patient identified, Emergency Drugs available, Suction available, Timeout performed and Patient being monitored Patient Re-evaluated:Patient Re-evaluated prior to induction Oxygen Delivery Method: Non-rebreather mask

## 2018-06-14 NOTE — Discharge Instructions (Signed)
EGD Discharge instructions Please read the instructions outlined below and refer to this sheet in the next few weeks. These discharge instructions provide you with general information on caring for yourself after you leave the hospital. Your doctor may also give you specific instructions. While your treatment has been planned according to the most current medical practices available, unavoidable complications occasionally occur. If you have any problems or questions after discharge, please call your doctor. ACTIVITY  You may resume your regular activity but move at a slower pace for the next 24 hours.   Take frequent rest periods for the next 24 hours.   Walking will help expel (get rid of) the air and reduce the bloated feeling in your abdomen.   No driving for 24 hours (because of the anesthesia (medicine) used during the test).   You may shower.   Do not sign any important legal documents or operate any machinery for 24 hours (because of the anesthesia used during the test).  NUTRITION  Drink plenty of fluids.   You may resume your normal diet.   Begin with a light meal and progress to your normal diet.   Avoid alcoholic beverages for 24 hours or as instructed by your caregiver.  MEDICATIONS  You may resume your normal medications unless your caregiver tells you otherwise.  WHAT YOU CAN EXPECT TODAY  You may experience abdominal discomfort such as a feeling of fullness or gas pains.  FOLLOW-UP  Your doctor will discuss the results of your test with you.  SEEK IMMEDIATE MEDICAL ATTENTION IF ANY OF THE FOLLOWING OCCUR:  Excessive nausea (feeling sick to your stomach) and/or vomiting.   Severe abdominal pain and distention (swelling).   Trouble swallowing.   Temperature over 101 F (37.8 C).   Rectal bleeding or vomiting of blood.    GERD information provided  Continue Protonix daily  Office visit with [place] in 6 months  Further recommendations to follow pending  review of pathology report     Gastroesophageal Reflux Disease, Adult Normally, food travels down the esophagus and stays in the stomach to be digested. If a person has gastroesophageal reflux disease (GERD), food and stomach acid move back up into the esophagus. When this happens, the esophagus becomes sore and swollen (inflamed). Over time, GERD can make small holes (ulcers) in the lining of the esophagus. Follow these instructions at home: Diet  Follow a diet as told by your doctor. You may need to avoid foods and drinks such as: ? Coffee and tea (with or without caffeine). ? Drinks that contain alcohol. ? Energy drinks and sports drinks. ? Carbonated drinks or sodas. ? Chocolate and cocoa. ? Peppermint and mint flavorings. ? Garlic and onions. ? Horseradish. ? Spicy and acidic foods, such as peppers, chili powder, curry powder, vinegar, hot sauces, and BBQ sauce. ? Citrus fruit juices and citrus fruits, such as oranges, lemons, and limes. ? Tomato-based foods, such as red sauce, chili, salsa, and pizza with red sauce. ? Fried and fatty foods, such as donuts, french fries, potato chips, and high-fat dressings. ? High-fat meats, such as hot dogs, rib eye steak, sausage, ham, and bacon. ? High-fat dairy items, such as whole milk, butter, and cream cheese.  Eat small meals often. Avoid eating large meals.  Avoid drinking large amounts of liquid with your meals.  Avoid eating meals during the 2-3 hours before bedtime.  Avoid lying down right after you eat.  Do not exercise right after you eat. General instructions  Pay attention to any changes in your symptoms.  Take over-the-counter and prescription medicines only as told by your doctor. Do not take aspirin, ibuprofen, or other NSAIDs unless your doctor says it is okay.  Do not use any tobacco products, including cigarettes, chewing tobacco, and e-cigarettes. If you need help quitting, ask your doctor.  Wear loose clothes.  Do not wear anything tight around your waist.  Raise (elevate) the head of your bed about 6 inches (15 cm).  Try to lower your stress. If you need help doing this, ask your doctor.  If you are overweight, lose an amount of weight that is healthy for you. Ask your doctor about a safe weight loss goal.  Keep all follow-up visits as told by your doctor. This is important. Contact a doctor if:  You have new symptoms.  You lose weight and you do not know why it is happening.  You have trouble swallowing, or it hurts to swallow.  You have wheezing or a cough that keeps happening.  Your symptoms do not get better with treatment.  You have a hoarse voice. Get help right away if:  You have pain in your arms, neck, jaw, teeth, or back.  You feel sweaty, dizzy, or light-headed.  You have chest pain or shortness of breath.  You throw up (vomit) and your throw up looks like blood or coffee grounds.  You pass out (faint).  Your poop (stool) is bloody or black.  You cannot swallow, drink, or eat. This information is not intended to replace advice given to you by your health care provider. Make sure you discuss any questions you have with your health care provider. Document Released: 12/14/2007 Document Revised: 12/03/2015 Document Reviewed: 10/22/2014 Elsevier Interactive Patient Education  2018 [place], Care After These instructions provide you with information about caring for yourself after your procedure. Your health care provider may also give you more specific instructions. Your treatment has been planned according to current medical practices, but problems sometimes occur. Call your health care provider if you have any problems or questions after your procedure. What can I expect after the procedure? After your procedure, it is common to:  Feel sleepy for several hours.  Feel clumsy and have poor balance for several hours.  Feel  forgetful about what happened after the procedure.  Have poor judgment for several hours.  Feel nauseous or vomit.  Have a sore throat if you had a breathing tube during the procedure.  Follow these instructions at home: For at least 24 hours after the procedure:   Do not: ? Participate in activities in which you could fall or become injured. ? Drive. ? Use heavy machinery. ? Drink alcohol. ? Take sleeping pills or medicines that cause drowsiness. ? Make important decisions or sign legal documents. ? Take care of children on your own.  Rest. Eating and drinking  Follow the diet that is recommended by your health care provider.  If you vomit, drink water, juice, or soup when you can drink without vomiting.  Make sure you have little or no nausea before eating solid foods. General instructions  Have a responsible adult stay with you until you are awake and alert.  Take over-the-counter and prescription medicines only as told by your health care provider.  If you smoke, do not smoke without supervision.  Keep all follow-up visits as told by your health care provider. This is important. Contact a health care  provider if:  You keep feeling nauseous or you keep vomiting.  You feel light-headed.  You develop a rash.  You have a fever. Get help right away if:  You have trouble breathing. This information is not intended to replace advice given to you by your health care provider. Make sure you discuss any questions you have with your health care provider. Document Released: 10/18/2015 Document Revised: 02/17/2016 Document Reviewed: 10/18/2015 Elsevier Interactive Patient Education  [employer].

## 2018-06-14 NOTE — H&P (Signed)
@LOGO @   Primary Care Physician:  Redmond School, MD Primary Gastroenterologist:  Dr. Gala Romney  Pre-Procedure History & Physical: HPI:  Peter [name] is a 84 y.o. male here for repeat esophageal dilation of a tight Schatzki's ring dilated just 6 weeks ago.  Past Medical History:  Diagnosis Date  . Anxiety   . Arthritis   . BPH (benign prostatic hyperplasia)   . Chronic pain   . Complication of anesthesia    had an anxiety attack once  . Depression   . GERD (gastroesophageal reflux disease)   . Hiatal hernia    small  . History of nuclear stress test    Nuc 5/19: soft tissue attenuation; no ischemia or scar, EF 72; Low Risk  . HTN (hypertension)   . Internal carotid artery stenosis, left   . Prostate disease    h/o elevated PSA, neg bx, [facility]  . Sciatica   . Wears glasses   . Wears partial dentures     Past Surgical History:  Procedure Laterality Date  . ABDOMINAL AORTOGRAM W/LOWER EXTREMITY N/A 10/26/2017   Procedure: ABDOMINAL AORTOGRAM W/LOWER EXTREMITY;  Surgeon: Waynetta Sandy, MD;  Location: [place] [facility];  Service: Cardiovascular;  Laterality: N/A;  bilateral  . BALLOON DILATION  04/23/2018   Procedure: BALLOON DILATION;  Surgeon: Daneil Dolin, MD;  Location: [facility];  Service: Endoscopy;;  esophagus  . BIOPSY  01/29/2014   Procedure: BIOPSY;  Surgeon: Daneil Dolin, MD;  Location: [facility];  Service: Endoscopy;;  Gastric and Esophageal  . CAROTID ENDARTERECTOMY Left 03/20/2017  . CATARACT EXTRACTION W/PHACO  05/21/2012   Procedure: CATARACT EXTRACTION PHACO AND INTRAOCULAR LENS PLACEMENT (IOC);  Surgeon: Tonny Branch, MD;  Location: [facility];  Service: Ophthalmology;  Laterality: Right;  CDE 19.34  . COLONOSCOPY  12/2004   diverticulosis, hyperplastic polyps, hemorrhoids  . ENDARTERECTOMY Left 03/20/2017   Procedure: ENDARTERECTOMY CAROTID-LEFT;  Surgeon: Conrad [place], MD;  Location: [place];  Service: Vascular;  Laterality: Left;   . ESOPHAGOGASTRODUODENOSCOPY  2003   schatzki ring, small vascular abnormality of duodenal bulb ?Dieulafoy's lesion (ablated)  . ESOPHAGOGASTRODUODENOSCOPY  11/10/10   Dr. Gala Romney dilation 2 rings with 30F  . ESOPHAGOGASTRODUODENOSCOPY N/A 01/29/2014   Dr.Felipe Cabell- multiple esophageal rings/webs- s/p dialation. small hiatal hernia. abnormal gastric mucosa. bx= mild chronic inactive gastritis (oxyntic mucosa), squamous mucosa with epithelial changes consistent with reflux related injury.  . ESOPHAGOGASTRODUODENOSCOPY (EGD) WITH PROPOFOL N/A 04/23/2018   Procedure: ESOPHAGOGASTRODUODENOSCOPY (EGD) WITH PROPOFOL;  Surgeon: Daneil Dolin, MD;  [facility];  Service: Endoscopy;  Laterality: N/A;  2:00pm  . MULTIPLE TOOTH EXTRACTIONS    . PATCH ANGIOPLASTY Left 03/20/2017  . PATCH ANGIOPLASTY Left 03/20/2017   Procedure: PATCH ANGIOPLASTY;  Surgeon: Conrad Kanawha, MD;  Location: [place];  Service: Vascular;  Laterality: Left;    Prior to Admission medications   Medication Sig Start Date End Date Taking? Authorizing Provider  ALPRAZolam Duanne Moron) 0.5 MG tablet Take 0.5 mg by mouth at bedtime.    Yes [provider]  amLODipine (NORVASC) 5 MG tablet Take 5 mg by mouth daily.  11/28/16  Yes [provider]  aspirin EC 81 MG tablet Take 81 mg by mouth daily.    Yes [provider]  finasteride (PROSCAR) 5 MG tablet Take 5 mg by mouth daily after lunch.    Yes [provider]  HYDROcodone-acetaminophen (NORCO/VICODIN) 5-325 MG per tablet Take 1 tablet by mouth daily  as needed for moderate pain (back pain).    Yes [provider]  lactulose (CHRONULAC) 10 GM/15ML solution TAKE TWO TABLESPOONFULS AT BEDTIME AS NEEDED. Patient taking differently: Take 30 g by mouth at bedtime. TAKE TWO TABLESPOONFULS AT BEDTIME AS NEEDED. 05/21/18  Yes Annitta Needs, NP  lisinopril (PRINIVIL,ZESTRIL) 40 MG tablet Take 40 mg by mouth daily.  12/08/16  Yes [provider]  pantoprazole (PROTONIX) 40 MG tablet Take 1 tablet (40 mg total) by mouth daily. 04/17/18  Yes Daneil Dolin, MD    Allergies as of 04/30/2018 - Review Complete 04/23/2018  Allergen Reaction Noted  . Penicillins Rash 05/17/2012    Family History  Problem Relation Age of Onset  . Stroke Father 16       deceased  . Heart attack Mother   . Colon cancer Neg Hx     Social History   Socioeconomic History  . Marital status: Widowed    Spouse name: Not on file  . Number of children: 1  . Years of education: Not on file  . Highest education level: Not on file  Occupational History  . Occupation: retired    Comment: Orthoptist  Social Needs  . Financial resource strain: Not on file  . Food insecurity:    Worry: Not on file    Inability: Not on file  . Transportation needs:    Medical: Not on file    Non-medical: Not on file  Tobacco Use  . Smoking status: Current Every Day Smoker    Packs/day: 1.00    Years: 60.00    Pack years: 60.00    Types: Cigarettes  . Smokeless tobacco: Never Used  Substance and Sexual Activity  . Alcohol use: No    Comment: quit 10  years ago  . Drug use: No  . Sexual activity: Not Currently    Partners: Female    Birth control/protection: None    Comment: spouse, has to use viagra  Lifestyle  . Physical activity:    Days per week: Not on file    Minutes per session: Not on file  . Stress: Not on file  Relationships  . Social connections:    Talks on phone: Not on file    Gets together: Not on file    Attends religious service: Not on file    Active member of club or organization: Not on file    Attends meetings of clubs or organizations: Not on file    Relationship status: Not on file  . Intimate partner violence:    Fear of current or ex partner: Not on file    Emotionally abused: Not on file    Physically abused: Not on file    Forced sexual activity: Not on file  Other Topics Concern  . Not on file  Social History Narrative  .  Not on file    Review of Systems: See HPI, otherwise negative ROS  Physical Exam: BP 127/64   Pulse 82   Temp 97.8 F (36.6 C)   Resp (!) 22   Ht 5\' 10"  (1.778 m)   Wt 71.7 kg   BMI 22.67 kg/m  General:   Alert,  pleasant and cooperative in NAD Neck:  Supple; no masses or thyromegaly. No significant cervical adenopathy. Lungs:  Clear throughout to auscultation.   No wheezes, crackles, or rhonchi. No acute distress. Heart:  Regular rate and rhythm; no murmurs, clicks, rubs,  or gallops. Abdomen: Non-distended, normal bowel sounds.  Soft and nontender without appreciable mass or hepatosplenomegaly.  Pulses:  Normal pulses noted. Extremities:  Without clubbing or edema.  Impression/Plan: Critical Schatzki's ring in a 84 year old gentleman  -  underwent balloon dilation 6 weeks ago.  Here for repeat dilation. The risks, benefits, limitations, alternatives and imponderables have been reviewed with the patient. Potential for esophageal dilation, biopsy, etc. have also been reviewed.  Questions have been answered. All parties agreeable.     Notice: This dictation was prepared with Dragon dictation along with smaller phrase technology. Any transcriptional errors that result from this process are unintentional and may not be corrected upon review.

## 2018-06-14 NOTE — Transfer of Care (Signed)
Immediate Anesthesia Transfer of Care Note  Patient: Peter [name]  Procedure(s) Performed: ESOPHAGOGASTRODUODENOSCOPY (EGD) WITH PROPOFOL (N/A ) MALONEY DILATION (N/A ) BIOPSY  Patient Location: PACU  Anesthesia Type:MAC  Level of Consciousness: awake, alert , oriented and patient cooperative  Airway & Oxygen Therapy: Patient Spontanous Breathing  Post-op Assessment: Report given to RN and Post -op Vital signs reviewed and stable  Post vital signs: Reviewed and stable  Last Vitals:  Vitals Value Taken Time  BP 97/63 06/14/2018 10:28 AM  Temp    Pulse 98 06/14/2018 10:30 AM  Resp 16 06/14/2018 10:29 AM  SpO2 98 % 06/14/2018 10:30 AM  Vitals shown include unvalidated device data.  Last Pain:  Vitals:   06/14/18 1009  PainSc: 0-No pain      Patients Stated Pain Goal: 5 (98/33/82 [id number])  Complications: No apparent anesthesia complications

## 2018-06-14 NOTE — Op Note (Signed)
[facility] Patient Name: Peter [name] Procedure Date: 06/14/2018 9:52 AM MRN: [id number] Date of Birth: [birth year] Attending MD: Norvel Richards , MD CSN: [id number] Age: 84 Admit Type: Ambulatory Procedure:                Upper GI endoscopy Indications:              Dysphagia Providers:                Norvel Richards, MD, Lurline Del, RN, Rosina Lowenstein, RN Referring MD:              Medicines:                Propofol per Anesthesia Complications:            No immediate complications. Estimated Blood Loss:     Estimated blood loss was minimal. Procedure:                After obtaining informed consent, the endoscope was                            passed under direct vision. Throughout the                            procedure, the patient's blood pressure, pulse, and                            oxygen saturations were monitored continuously. The                            [id number]-H190 ([id number]) scope was introduced through the                            and advanced to the second part of duodenum. Scope In: 10:11:19 AM Scope Out: 10:20:50 AM Total Procedure Duration: 0 hours 9 minutes 31 seconds  Findings:      A obstructing Schatzki ring was found at the gastroesophageal junction.       Initially obstructed the scope but the ring popped open partially with       gentle pressure. No esophagitis seen. No tumor. Further examination       esophagus revealed smaller rings cephalad to the Schatzki's ring at the       GE junction. No Barrett's epithelium seen The scope was withdrawn.       Dilation was performed with a Maloney dilator with mild resistance at 50       Fr. The dilation site was examined following endoscope reinsertion and       showed mild mucosal disruption. Estimated blood loss was minimal. The       scope was withdrawn. Dilation was performed with a Maloney dilator with       mild resistance at 52 Fr. The dilation site was examined  following       endoscope reinsertion and showed moderate mucosal disruption. Estimated       blood loss was minimal. Finally, biopsies of the mid and distal       esophagus taken.  A medium-sized hiatal hernia was present.      The exam was otherwise without abnormality.      The duodenal bulb and second portion of the duodenum were normal. Impression:               - Obstructing Schatzki ring. Status post Maloney                            dilation and subsequent biopsy.                           - Medium-sized hiatal hernia.                           - The examination was otherwise normal.                           - Normal duodenal bulb and second portion of the                            duodenum. Biopsied. Moderate Sedation:      Moderate (conscious) sedation was personally administered by an       anesthesia professional. The following parameters were monitored: oxygen       saturation, heart rate, blood pressure, respiratory rate, EKG, adequacy       of pulmonary ventilation, and response to care. Recommendation:           - Patient has a contact number available for                            emergencies. The signs and symptoms of potential                            delayed complications were discussed with the                            patient. Return to normal activities tomorrow.                            Written discharge instructions were provided to the                            patient.                           - Advance diet as tolerated. Continue Protonix 40                            mg daily.                           - Await pathology results.                           - Repeat upper endoscopy (date not yet determined)  for surveillance based on pathology results.                           - Return to GI clinic in 6 months. Procedure Code(s):        --- Professional ---                           [phone], Esophagogastroduodenoscopy,  flexible,                            transoral; with biopsy, single or multiple                           [id number], Dilation of esophagus, by unguided sound or                            bougie, single or multiple passes Diagnosis Code(s):        --- Professional ---                           K22.2, Esophageal obstruction                           K44.9, Diaphragmatic hernia without obstruction or                            gangrene                           R13.10, Dysphagia, unspecified CPT copyright 2018 [employer]. All rights reserved. The codes documented in this report are preliminary and upon coder review may  be revised to meet current compliance requirements. Cristopher Estimable. Marilu Rylander, MD Norvel Richards, MD 06/14/2018 10:29:35 AM This report has been signed electronically. Number of Addenda: 0

## 2018-06-14 NOTE — Anesthesia Preprocedure Evaluation (Signed)
Anesthesia Evaluation    Airway Mallampati: II       Dental  (+) Upper Dentures, Partial Lower   Pulmonary Current Smoker,     + decreased breath sounds      Cardiovascular hypertension, On Medications + Peripheral Vascular Disease       Neuro/Psych PSYCHIATRIC DISORDERS Anxiety Depression  Neuromuscular disease    GI/Hepatic hiatal hernia, GERD  ,  Endo/Other    Renal/GU      Musculoskeletal   Abdominal   Peds  Hematology   Anesthesia Other Findings Multiple EGD/Dilitations for Schatzki's ring, GERD S/P CEA for stenosis, h/o TIA NPO Ongoing tob abuse, today  Reproductive/Obstetrics                             Anesthesia Physical Anesthesia Plan  ASA: III  Anesthesia Plan: MAC   Post-op Pain Management:    Induction:   PONV Risk Score and Plan:   Airway Management Planned:   Additional Equipment:   Intra-op Plan:   Post-operative Plan:   Informed Consent:   Plan Discussed with: Anesthesiologist  Anesthesia Plan Comments:         Anesthesia Quick Evaluation

## 2018-06-14 NOTE — Anesthesia Postprocedure Evaluation (Signed)
Anesthesia Post Note  Patient: Peter [name]  Procedure(s) Performed: ESOPHAGOGASTRODUODENOSCOPY (EGD) WITH PROPOFOL (N/A ) MALONEY DILATION (N/A ) BIOPSY  Patient location during evaluation: PACU Anesthesia Type: MAC Level of consciousness: awake and alert and oriented Pain management: pain level controlled Vital Signs Assessment: post-procedure vital signs reviewed and stable Respiratory status: spontaneous breathing Cardiovascular status: stable Postop Assessment: no apparent nausea or vomiting Anesthetic complications: no     Last Vitals:  Vitals:   06/14/18 0840  BP: 127/64  Pulse: 82  Resp: (!) 22  Temp: 36.6 C    Last Pain:  Vitals:   06/14/18 1009  PainSc: 0-No pain                 Duell Holdren A

## 2018-06-17 ENCOUNTER — Encounter: Payer: Self-pay | Admitting: Internal Medicine

## 2018-06-20 ENCOUNTER — Other Ambulatory Visit: Payer: Self-pay

## 2018-06-20 ENCOUNTER — Encounter (HOSPITAL_COMMUNITY): Payer: Self-pay | Admitting: Internal Medicine

## 2018-06-20 DIAGNOSIS — I6523 Occlusion and stenosis of bilateral carotid arteries: Secondary | ICD-10-CM

## 2018-06-20 DIAGNOSIS — Z9889 Other specified postprocedural states: Secondary | ICD-10-CM

## 2018-06-20 DIAGNOSIS — I70222 Atherosclerosis of native arteries of extremities with rest pain, left leg: Secondary | ICD-10-CM

## 2018-07-12 ENCOUNTER — Encounter (HOSPITAL_COMMUNITY): Payer: Medicare Other

## 2018-07-12 ENCOUNTER — Ambulatory Visit: Payer: Medicare Other | Admitting: Family

## 2018-08-24 NOTE — Progress Notes (Signed)
HISTORY AND PHYSICAL     CC:  follow up. Requesting Provider:  Redmond School, MD  HPI: This is a 85 y.o. male who is here today for follow up.  He has hx of rest pain that was resolved.  He was offered a right ax-bifem bypass graft by Dr. Bridgett Larsson, however, since his rest pain resolved, pt wanted to avoid surgery.  He was having intermittent claudication.  He did not have any wounds at his last visit.   He also has hx of left CEA on 03/20/17 by Dr. Bridgett Larsson for asymptomatic carotid stenosis.  He was discharged home on POD 1.   The pt returns today for follow up carotid duplex and ABI's and accompanied by his daughter.   He denies any stroke sx such as temporary blindness, speech difficulties, clumsiness or hemiparesis or weakness of an extremity.  He denies pain in his feet or non healing wounds.  He states that he gets around with his cane.  If he goes out, it is to the drug or grocery store.  His wife died last 10/30/22 and he states he does not enjoy living by himself.  He continues to smoke.  Not too much interest in quitting.   The pt is not on a statin for cholesterol management.    The pt does not have diabetes. The pt is on CCB and ACEI for hypertension.  The pt is on an aspirin.  Tobacco hx:  current Other AC:  none  Past Medical History:  Diagnosis Date  . Anxiety   . Arthritis   . BPH (benign prostatic hyperplasia)   . Chronic pain   . Complication of anesthesia    had an anxiety attack once  . Depression   . GERD (gastroesophageal reflux disease)   . Hiatal hernia    small  . History of nuclear stress test    Nuc 5/19: soft tissue attenuation; no ischemia or scar, EF 72; Low Risk  . HTN (hypertension)   . Internal carotid artery stenosis, left   . Prostate disease    h/o elevated PSA, neg bx, [facility]  . Sciatica   . Wears glasses   . Wears partial dentures     Past Surgical History:  Procedure Laterality Date  . ABDOMINAL AORTOGRAM W/LOWER EXTREMITY N/A  10/26/2017   Procedure: ABDOMINAL AORTOGRAM W/LOWER EXTREMITY;  Surgeon: Waynetta Sandy, MD;  Location: [facility];  Service: Cardiovascular;  Laterality: N/A;  bilateral  . BALLOON DILATION  04/23/2018   Procedure: BALLOON DILATION;  Surgeon: Daneil Dolin, MD;  [facility];  Service: Endoscopy;;  esophagus  . BIOPSY  01/29/2014   Procedure: BIOPSY;  Surgeon: Daneil Dolin, MD;  [facility];  Service: Endoscopy;;  Gastric and Esophageal  . BIOPSY  06/14/2018   Procedure: BIOPSY;  Surgeon: Daneil Dolin, MD;  [facility];  Service: Endoscopy;;  esophagus  . CAROTID ENDARTERECTOMY Left 03/20/2017  . CATARACT EXTRACTION W/PHACO  05/21/2012   Procedure: CATARACT EXTRACTION PHACO AND INTRAOCULAR LENS PLACEMENT (IOC);  Surgeon: Tonny Branch, MD;  Location: [facility];  Service: Ophthalmology;  Laterality: Right;  CDE 19.34  . COLONOSCOPY  12/2004   diverticulosis, hyperplastic polyps, hemorrhoids  . ENDARTERECTOMY Left 03/20/2017   Procedure: ENDARTERECTOMY CAROTID-LEFT;  Surgeon: Conrad Stockton, MD;  Location: [facility];  Service: Vascular;  Laterality: Left;  . ESOPHAGOGASTRODUODENOSCOPY  2001-10-29   schatzki ring, small vascular abnormality of duodenal bulb ?Dieulafoy's lesion (  ablated)  . ESOPHAGOGASTRODUODENOSCOPY  11/10/10   Dr. Gala Romney dilation 2 rings with 61F  . ESOPHAGOGASTRODUODENOSCOPY N/A 01/29/2014   Dr.Rourk- multiple esophageal rings/webs- s/p dialation. small hiatal hernia. abnormal gastric mucosa. bx= mild chronic inactive gastritis (oxyntic mucosa), squamous mucosa with epithelial changes consistent with reflux related injury.  . ESOPHAGOGASTRODUODENOSCOPY (EGD) WITH PROPOFOL N/A 04/23/2018   Procedure: ESOPHAGOGASTRODUODENOSCOPY (EGD) WITH PROPOFOL;  Surgeon: Daneil Dolin, MD;  [facility];  Service: Endoscopy;  Laterality: N/A;  2:00pm  . ESOPHAGOGASTRODUODENOSCOPY (EGD) WITH PROPOFOL N/A 06/14/2018   Procedure:  ESOPHAGOGASTRODUODENOSCOPY (EGD) WITH PROPOFOL;  Surgeon: Daneil Dolin, MD;  [facility];  Service: Endoscopy;  Laterality: N/A;  10:00am  . MALONEY DILATION N/A 06/14/2018   Procedure: Venia Minks DILATION;  Surgeon: Daneil Dolin, MD;  [facility];  Service: Endoscopy;  Laterality: N/A;  . MULTIPLE TOOTH EXTRACTIONS    . PATCH ANGIOPLASTY Left 03/20/2017  . PATCH ANGIOPLASTY Left 03/20/2017   Procedure: PATCH ANGIOPLASTY;  Surgeon: Conrad , MD;  Location: [facility];  Service: Vascular;  Laterality: Left;    Allergies  Allergen Reactions  . Penicillins Rash    Has patient had a PCN reaction causing immediate rash, facial/tongue/throat swelling, SOB or lightheadedness with hypotension: Yes Has patient had a PCN reaction causing severe rash involving mucus membranes or skin necrosis: No Has patient had a PCN reaction that required hospitalization: No Has patient had a PCN reaction occurring within the last 10 years: No If all of the above answers are "NO", then may proceed with Cephalosporin use.     Current Outpatient Medications  Medication Sig Dispense Refill  . ALPRAZolam (XANAX) 0.5 MG tablet Take 0.5 mg by mouth at bedtime.     Marland Kitchen amLODipine (NORVASC) 5 MG tablet Take 5 mg by mouth daily.     Marland Kitchen aspirin EC 81 MG tablet Take 81 mg by mouth daily.     . finasteride (PROSCAR) 5 MG tablet Take 5 mg by mouth daily after lunch.     Marland Kitchen HYDROcodone-acetaminophen (NORCO/VICODIN) 5-325 MG per tablet Take 1 tablet by mouth daily as needed for moderate pain (back pain).     Marland Kitchen lactulose (CHRONULAC) 10 GM/15ML solution TAKE TWO TABLESPOONFULS AT BEDTIME AS NEEDED. (Patient taking differently: Take 30 g by mouth at bedtime. TAKE TWO TABLESPOONFULS AT BEDTIME AS NEEDED.) 946 mL 1  . lisinopril (PRINIVIL,ZESTRIL) 40 MG tablet Take 40 mg by mouth daily.     . pantoprazole (PROTONIX) 40 MG tablet Take 1 tablet (40 mg total) by mouth daily. 90 tablet 3   No current  facility-administered medications for this visit.    Facility-Administered Medications Ordered in Other Visits  Medication Dose Route Frequency Provider Last Rate Last Dose  . shugarcaine ophthalmic solution    PRN Tonny Branch, MD        Family History  Problem Relation Age of Onset  . Stroke Father 32       deceased  . Heart attack Mother   . Colon cancer Neg Hx     Social History   Socioeconomic History  . Marital status: Widowed    Spouse name: Not on file  . Number of children: 1  . Years of education: Not on file  . Highest education level: Not on file  Occupational History  . Occupation: retired    Comment: Orthoptist  Social Needs  . Financial resource strain: Not on file  . Food insecurity:    Worry:  Not on file    Inability: Not on file  . Transportation needs:    Medical: Not on file    Non-medical: Not on file  Tobacco Use  . Smoking status: Current Every Day Smoker    Packs/day: 1.00    Years: 60.00    Pack years: 60.00    Types: Cigarettes  . Smokeless tobacco: Never Used  Substance and Sexual Activity  . Alcohol use: No    Comment: quit 10  years ago  . Drug use: No  . Sexual activity: Not Currently    Partners: Female    Birth control/protection: None    Comment: spouse, has to use viagra  Lifestyle  . Physical activity:    Days per week: Not on file    Minutes per session: Not on file  . Stress: Not on file  Relationships  . Social connections:    Talks on phone: Not on file    Gets together: Not on file    Attends religious service: Not on file    Active member of club or organization: Not on file    Attends meetings of clubs or organizations: Not on file    Relationship status: Not on file  . Intimate partner violence:    Fear of current or ex partner: Not on file    Emotionally abused: Not on file    Physically abused: Not on file    Forced sexual activity: Not on file  Other Topics Concern  . Not on file  Social History Narrative  .  Not on file     REVIEW OF SYSTEMS:   [X]  denotes positive finding, [ ]  denotes negative finding Cardiac  Comments:  Chest pain or chest pressure:    Shortness of breath upon exertion:    Short of breath when lying flat:    Irregular heart rhythm:        Vascular    Pain in calf, thigh, or hip brought on by ambulation:    Pain in feet at night that wakes you up from your sleep:     Blood clot in your veins:    Leg swelling:         Pulmonary    Oxygen at home:    Productive cough:     Wheezing:         Neurologic    Sudden weakness in arms or legs:     Sudden numbness in arms or legs:     Sudden onset of difficulty speaking or slurred speech:    Temporary loss of vision in one eye:     Problems with dizziness:         Gastrointestinal    Blood in stool:     Vomited blood:         Genitourinary    Burning when urinating:     Blood in urine:        Psychiatric    Major depression:         Hematologic    Bleeding problems:    Problems with blood clotting too easily:        Skin    Rashes or ulcers:        Constitutional    Fever or chills:      PHYSICAL EXAMINATION:  Today's Vitals   08/27/18 1044  BP: (!) 169/64  Pulse: 78  Resp: 16  Temp: 97.8 F (36.6 C)  TempSrc: Oral  SpO2: 96%  Weight: 161 lb  6 oz (73.2 kg)  Height: 5\' 10"  (1.778 m)   Body mass index is 23.16 kg/m.   General:  WDWN in NAD; vital signs documented above Gait: Not observed HENT: WNL, normocephalic Pulmonary: normal non-labored breathing , without Rales, rhonchi,  wheezing Cardiac: regular HR, without  Murmurs; with right carotid bruit Abdomen: soft, NT, no masses Skin: without rashes Vascular Exam/Pulses:  Right Left  Radial 2+ (normal) 2+ (normal)  Femoral 2+ (normal) 2+ (normal)  Popliteal Unable to palpate  Unable to palpate   DP Unable to palpate  Unable to palpate   PT Unable to palpate  Unable to palpate    Extremities: without ischemic changes, without  Gangrene , without cellulitis; without open wounds;  Musculoskeletal: no muscle wasting or atrophy  Neurologic: A&O X 3;  No focal weakness or paresthesias are detected Psychiatric:  The pt has Normal affect.   Non-Invasive Vascular Imaging:   ABI's/TBI's on 08/27/2018: Right:  0.47/0.12 toe pressure 12  AT (M)  PT (M) Left:  0.32/0.0 toe pressure 0  AT (M)  PT (M)  Carotid duplex 08/27/2018: Right:  40-59% ICA stenosis PSV in proximal ICA 407 however, EDV is 43 Left:  Patent CEA site  Aortogram on 10/26/17 by Dr. Donzetta Matters: The bilateral common femoral arteries as well as right external iliac artery and left external and common iliac arteries are occluded.  The common iliac artery on the right is significantly calcified.  He reconstitutes bilateral profunda femoris arteries which give rise to mid thigh superficial femoral arteries.  His popliteal arteries appear to be patent as do the beginnings of the tibial arteries but we could not get better imaging of the tibials given the lack of contrast that would reach that far down the legs.  Previous ABI' on 09/04/17 ([place]): Right:  0.41 Left:  0.28  Carotid duplex on 02/07/18: Right:  60-79% stenosis with ECA >50% Left:  Patent CEA site    ASSESSMENT/PLAN:: 85 y.o. male here for follow up for intermittent claudication and carotid stenosis.    Carotid stenosis on the right 40-59% with EDV of 43cm/s.  Pt remains asymptomatic.  Discussed s/s of stroke and he knows to go to ER should he develop any of these sx.   Continue asa.  PAD:  ABI's about the same from previous visit.  He does not have rest pain or non healing wounds.  Discussed with him about protecting his feet and he is at risk of wounds not healing should he get a wound.  He will continue mobilization with cane.    Tobacco use:  Discussed in detail with pt importance of smoking cessation.  Pt states he knows importance but doesn't  Really want to quit right  now.  Will have him come back in 3 months and check carotid duplex and ABI's.  If about the same, will extend it to 6 months.  He knows to call sooner if he develops any wounds and present to ER should he develop stroke sx, which we discussed.    Leontine Locket, PA-C [facility] [phone]  Clinic MD:   Trula Slade

## 2018-08-27 ENCOUNTER — Other Ambulatory Visit: Payer: Self-pay

## 2018-08-27 ENCOUNTER — Ambulatory Visit (INDEPENDENT_AMBULATORY_CARE_PROVIDER_SITE_OTHER)
Admission: RE | Admit: 2018-08-27 | Discharge: 2018-08-27 | Disposition: A | Discharge status: Home / Self Care | Payer: Medicare Other | Source: Ambulatory Visit | Attending: Vascular Surgery | Admitting: Vascular Surgery

## 2018-08-27 ENCOUNTER — Ambulatory Visit: Payer: Medicare Other | Admitting: Physician Assistant

## 2018-08-27 ENCOUNTER — Ambulatory Visit (HOSPITAL_COMMUNITY)
Admission: RE | Admit: 2018-08-27 | Discharge: 2018-08-27 | Disposition: A | Discharge status: Home / Self Care | Payer: Medicare Other | Source: Ambulatory Visit | Attending: Vascular Surgery | Admitting: Vascular Surgery

## 2018-08-27 ENCOUNTER — Encounter: Payer: Self-pay | Admitting: Family

## 2018-08-27 VITALS — BP 169/64 | HR 78 | Temp 97.8°F | Resp 16 | Ht 70.0 in | Wt 161.4 lb

## 2018-08-27 DIAGNOSIS — I6523 Occlusion and stenosis of bilateral carotid arteries: Secondary | ICD-10-CM

## 2018-08-27 DIAGNOSIS — I70222 Atherosclerosis of native arteries of extremities with rest pain, left leg: Secondary | ICD-10-CM | POA: Insufficient documentation

## 2018-08-27 DIAGNOSIS — Z9889 Other specified postprocedural states: Secondary | ICD-10-CM | POA: Diagnosis not present

## 2018-10-04 ENCOUNTER — Other Ambulatory Visit: Payer: Self-pay | Admitting: Gastroenterology

## 2018-11-06 ENCOUNTER — Other Ambulatory Visit: Payer: Self-pay

## 2018-11-06 DIAGNOSIS — I6523 Occlusion and stenosis of bilateral carotid arteries: Secondary | ICD-10-CM

## 2018-11-06 NOTE — Progress Notes (Signed)
carotid 

## 2018-11-07 IMAGING — DX DG RIBS W/ CHEST 3+V*R*
4 series · 4 of 4 positions shown · non-contrast
Comparison: 04/22/2009

CLINICAL DATA: Pt states last week was pulling the trash can to
road and it fell over twisting his r arm. Pt c/o pain is getting
worse to under right arm. Pain worse with movement was 2 weeks ago.
Hx of COPD. Pt states that pain comes and goes. Hurt when lying
down. Pain to the lateral and back side of his right side.

EXAM:
RIGHT RIBS AND CHEST - 3+ VIEW

[chest pa]
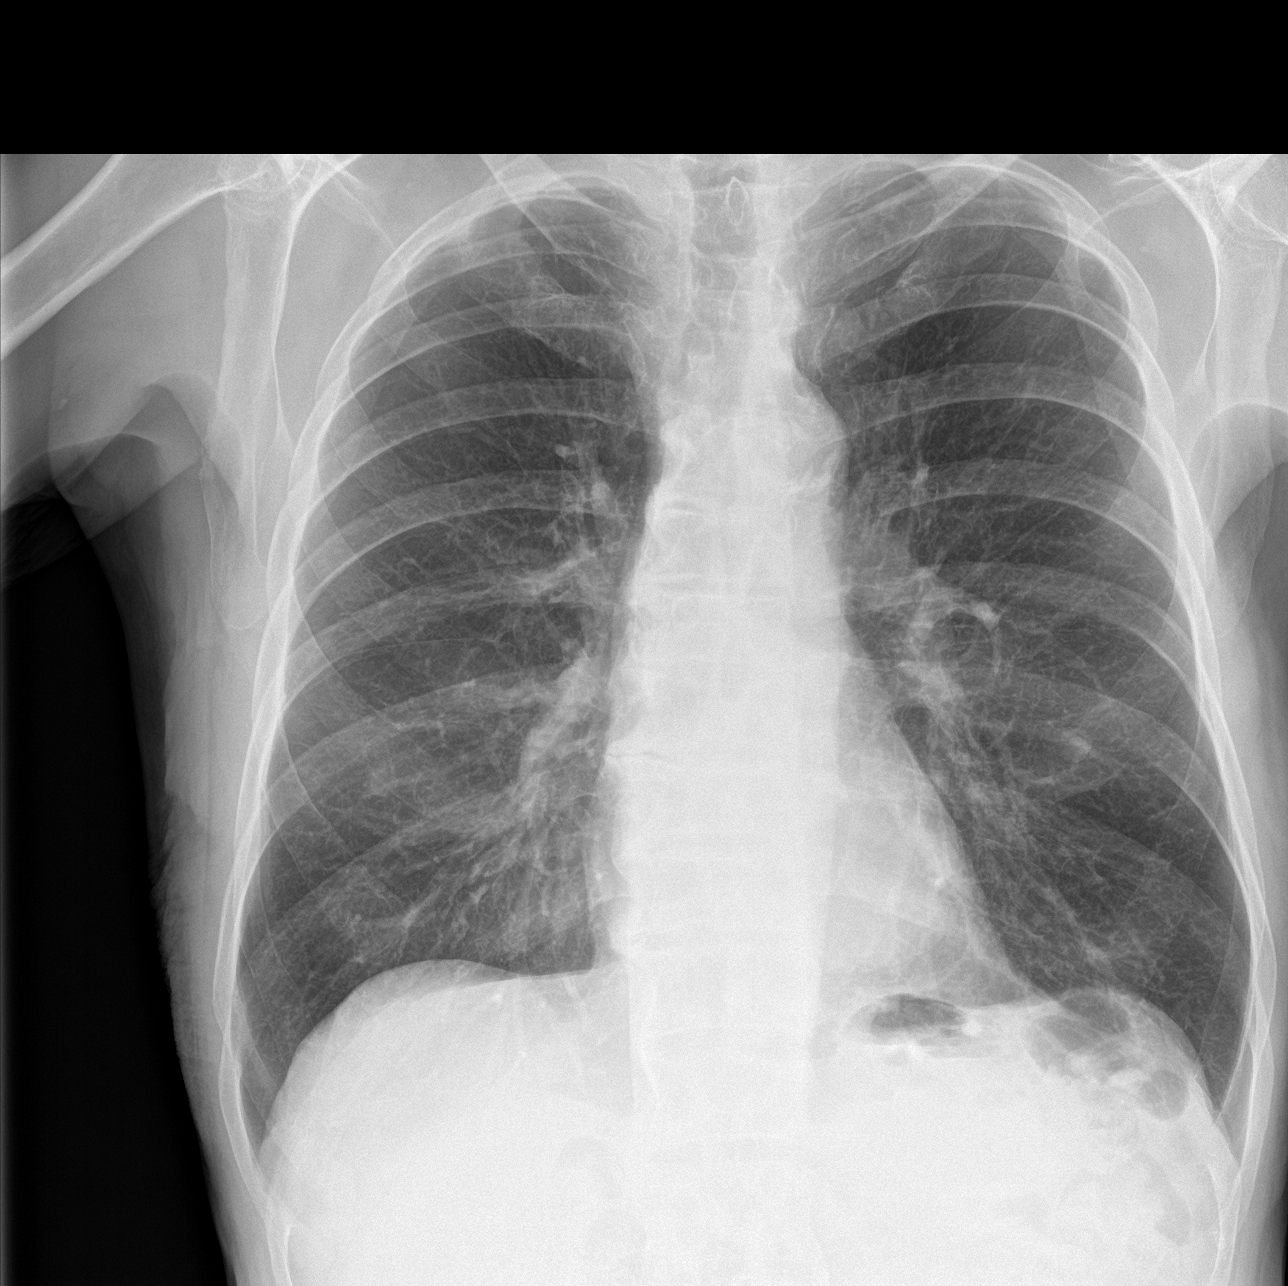

[rib ap]
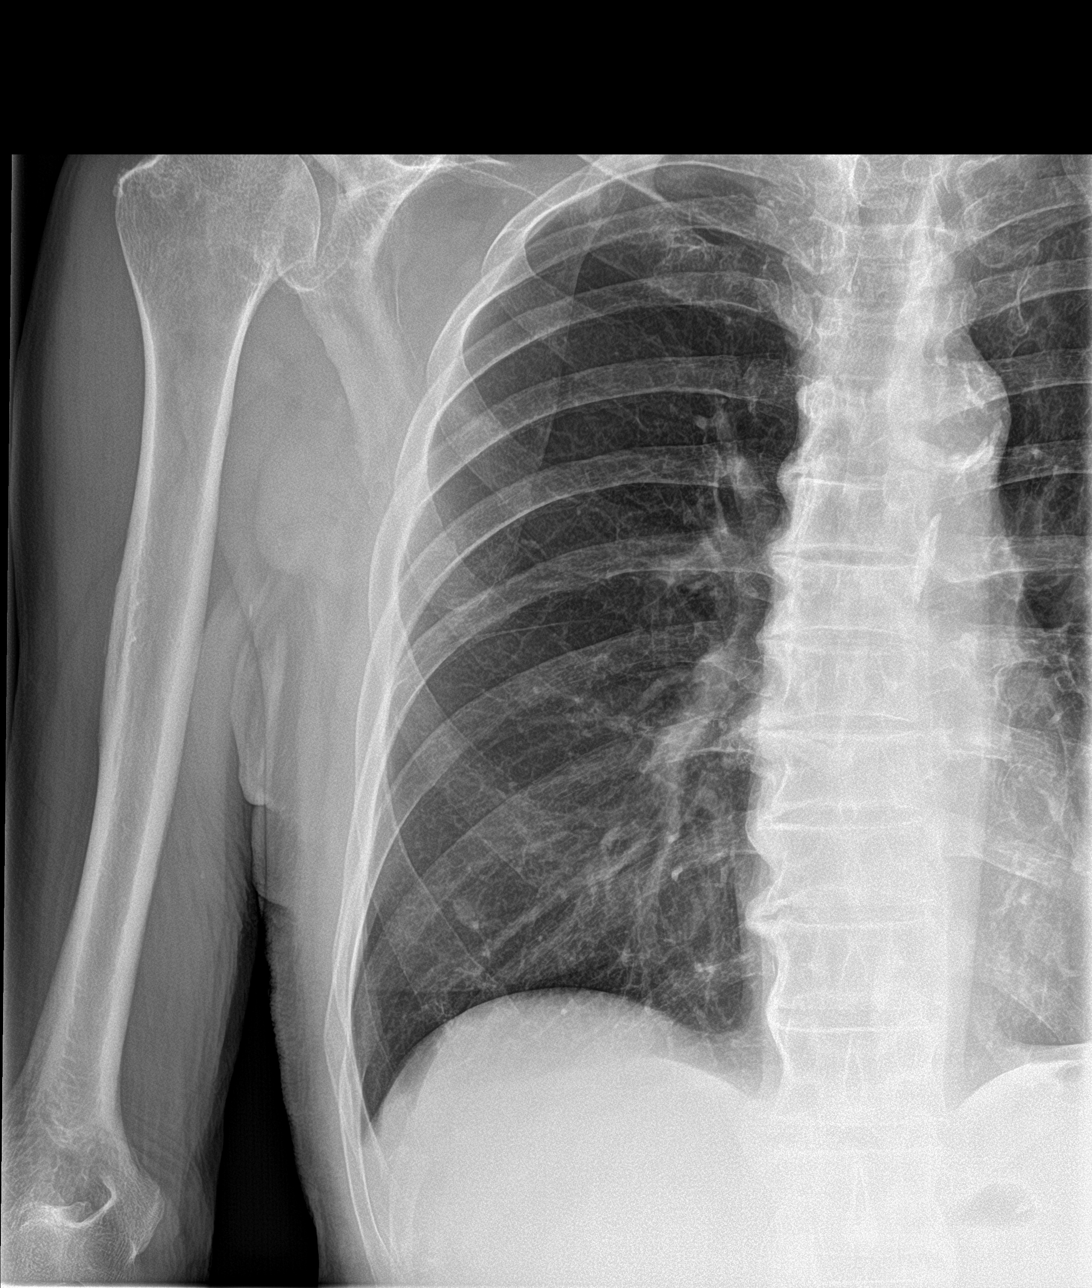

[rib pa obl]
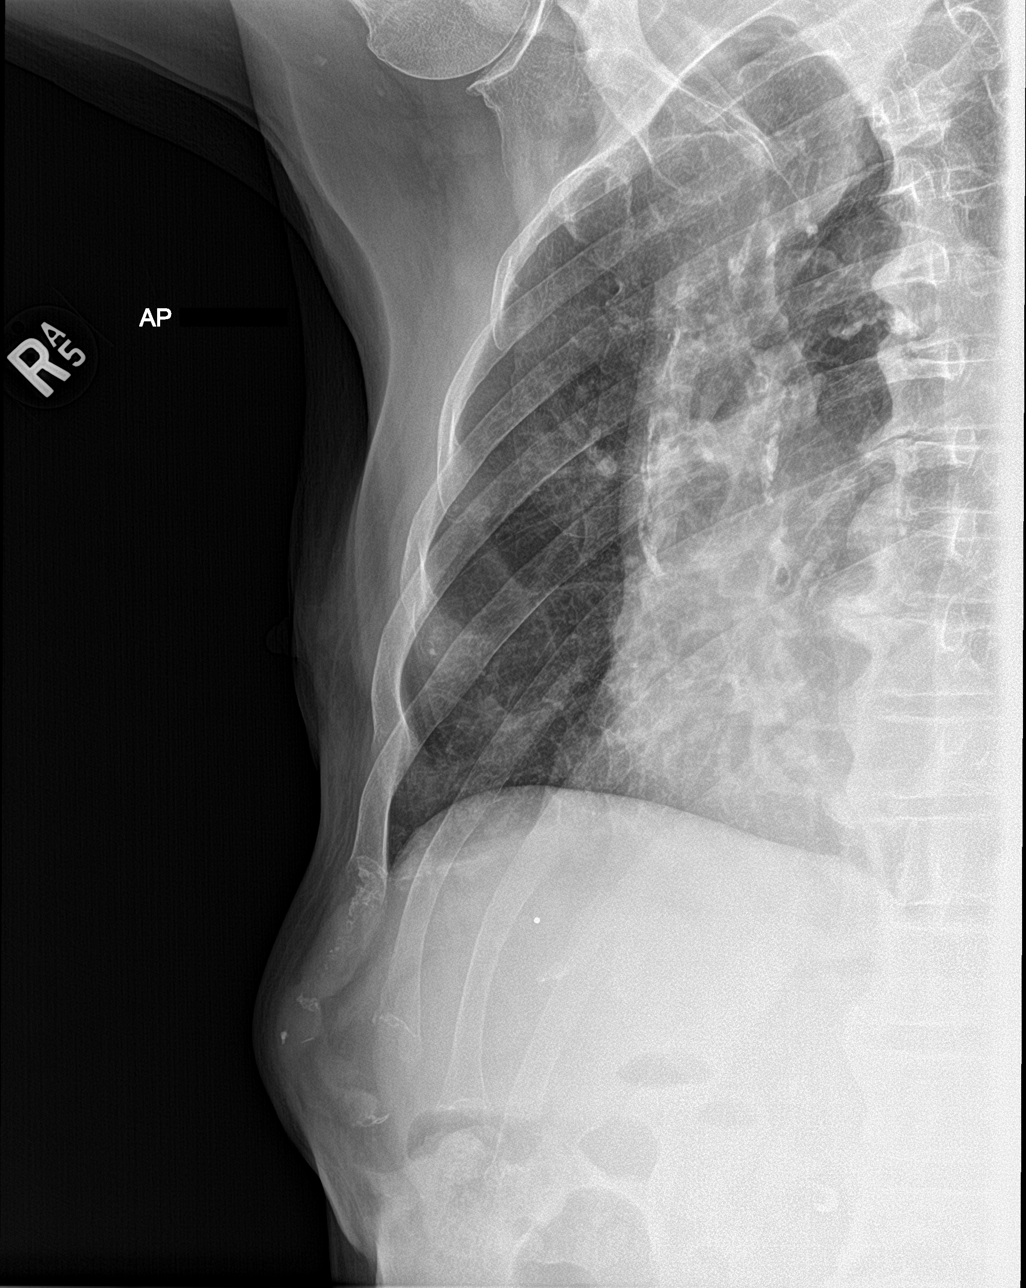

[chest ap]
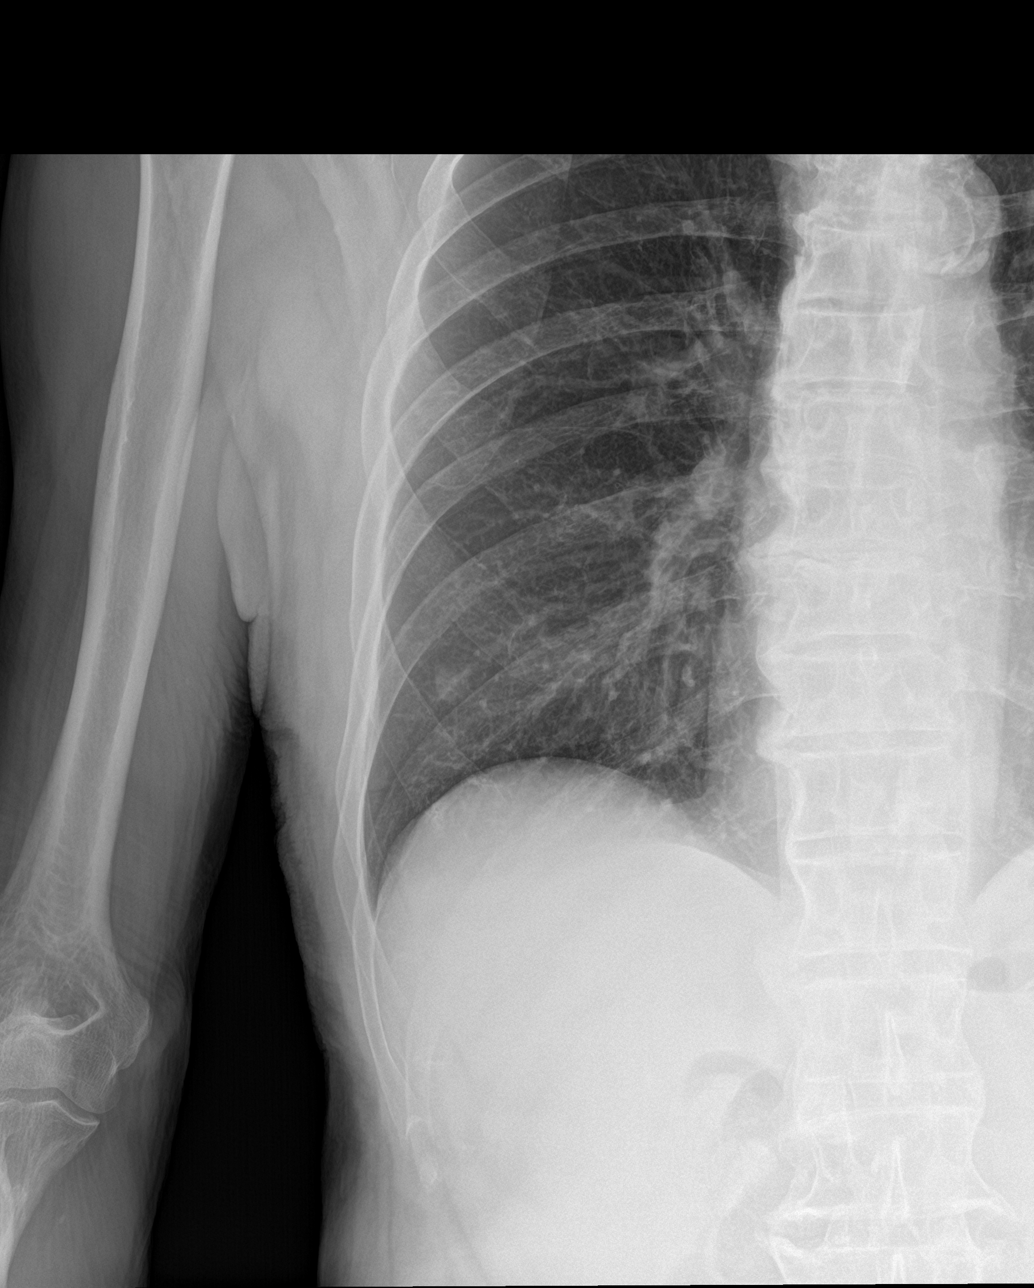

[4 of 4 positions shown; findings below may reference images not displayed]

FINDINGS: No rib fracture or rib lesion.  Bones are demineralized.

The heart, mediastinum and hila are unremarkable.

Lungs are hyperexpanded but clear. No pleural effusion or
pneumothorax.
IMPRESSION: 1. No rib fracture or rib lesion.
2. No acute cardiopulmonary disease.

## 2018-11-09 DIAGNOSIS — J449 Chronic obstructive pulmonary disease, unspecified: Secondary | ICD-10-CM | POA: Diagnosis not present

## 2018-11-09 DIAGNOSIS — G894 Chronic pain syndrome: Secondary | ICD-10-CM | POA: Diagnosis not present

## 2018-11-09 DIAGNOSIS — I10 Essential (primary) hypertension: Secondary | ICD-10-CM | POA: Diagnosis not present

## 2018-11-09 DIAGNOSIS — Z1389 Encounter for screening for other disorder: Secondary | ICD-10-CM | POA: Diagnosis not present

## 2018-11-09 DIAGNOSIS — Z0001 Encounter for general adult medical examination with abnormal findings: Secondary | ICD-10-CM | POA: Diagnosis not present

## 2018-11-26 ENCOUNTER — Encounter (HOSPITAL_COMMUNITY): Payer: Medicare Other

## 2018-11-26 ENCOUNTER — Ambulatory Visit: Payer: Medicare Other | Admitting: Family

## 2018-11-26 DIAGNOSIS — E7849 Other hyperlipidemia: Secondary | ICD-10-CM | POA: Diagnosis not present

## 2018-11-26 DIAGNOSIS — Z1389 Encounter for screening for other disorder: Secondary | ICD-10-CM | POA: Diagnosis not present

## 2018-12-10 ENCOUNTER — Ambulatory Visit (INDEPENDENT_AMBULATORY_CARE_PROVIDER_SITE_OTHER): Payer: Medicare Other | Admitting: Gastroenterology

## 2018-12-10 ENCOUNTER — Other Ambulatory Visit: Payer: Self-pay

## 2018-12-10 ENCOUNTER — Encounter: Payer: Self-pay | Admitting: Internal Medicine

## 2018-12-10 ENCOUNTER — Encounter: Payer: Self-pay | Admitting: Gastroenterology

## 2018-12-10 DIAGNOSIS — R1310 Dysphagia, unspecified: Secondary | ICD-10-CM

## 2018-12-10 DIAGNOSIS — R1319 Other dysphagia: Secondary | ICD-10-CM

## 2018-12-10 MED ORDER — PANTOPRAZOLE SODIUM 40 MG PO TBEC: 40.0000 mg | DELAYED_RELEASE_TABLET | Freq: Every day | ORAL | 3 refills | Status: DC

## 2018-12-10 NOTE — Patient Instructions (Signed)
1. Continue pantoprazole once daily before breakfast.  2. Monitor your swallowing, if symptoms worsen, please call and we will discuss if you need your esophagus stretched again.  3. Return to the office in six months.

## 2018-12-10 NOTE — Addendum Note (Signed)
Addended by: Mahala Menghini on: 12/10/2018 11:08 AM   Modules accepted: Orders

## 2018-12-10 NOTE — Progress Notes (Signed)
Primary Care Physician:  Redmond School, MD Primary GI: Garfield Cornea, MD   Patient Location: Home  Provider Location: [facility] office  Reason for Phone Visit: dysphagia  Persons present on the phone encounter, with roles: Patient, myself (provider),Martina Darrick Grinder, LPN(updated meds and allergies)  Total time (minutes) spent on medical discussion: 15 minutes  Due to COVID-19, visit was conducted using telephonic method (no video was available).  Visit was requested by patient.  Virtual Visit via Telephone only  I connected with Peter [name] on 12/10/18 at 10:00 AM EDT by telephone and verified that I am speaking with the correct person using two identifiers.   I discussed the limitations, risks, security and privacy concerns of performing an evaluation and management service by telephone and the availability of in person appointments. I also discussed with the patient that there may be a patient responsible charge related to this service. The patient expressed understanding and agreed to proceed.   HPI:   Patient is a pleasant 85 y/o male who presents for telephone visit regarding dysphagia.  He has had his esophagus dilated multiple times with history of Schatzki ring.  Last EGD December 2019 as outlined below.    . ESOPHAGOGASTRODUODENOSCOPY (EGD) WITH PROPOFOL N/A 06/14/2018   Dr. Gala Romney: Obstructing Schatzki ring, status post dilation medium-sized hiatal hernia, biopsies from the mid to distal esophagus obtained which were benign.   EGD/ED helped a lot. But still some things can't eat. Cannot eat [place] stew. Avoids steak, just hasn't tried it. Really doesn't eat a lot of bread. For BF, Ensure, donut, glass of juice. Lunch vegetable plate or junior whopper or fries. Doesn't have any problem with these foods.  No pill dysphagia.  Sometimes problems with ensure, feels the "band" closes up sometimes, has to wait for it open up. No epigastric pain. No heartburn on pantoprazole. BMs  okay. No melena, brbpr.  Overall doing a lot better though.  Current Outpatient Medications  Medication Sig Dispense Refill  . ALPRAZolam (XANAX) 0.5 MG tablet Take 0.5 mg by mouth at bedtime.     Marland Kitchen amLODipine (NORVASC) 5 MG tablet Take 5 mg by mouth daily.     Marland Kitchen aspirin EC 81 MG tablet Take 81 mg by mouth daily.     . finasteride (PROSCAR) 5 MG tablet Take 5 mg by mouth daily after lunch.     Marland Kitchen HYDROcodone-acetaminophen (NORCO/VICODIN) 5-325 MG per tablet Take 1 tablet by mouth daily as needed for moderate pain (back pain).     Marland Kitchen lactulose (CHRONULAC) 10 GM/15ML solution TAKE TWO TABLESPOONFULS AT BEDTIME AS NEEDED. 946 mL 5  . lisinopril (PRINIVIL,ZESTRIL) 40 MG tablet Take 40 mg by mouth daily.     . pantoprazole (PROTONIX) 40 MG tablet Take 1 tablet (40 mg total) by mouth daily. 90 tablet 3   No current facility-administered medications for this visit.    Facility-Administered Medications Ordered in Other Visits  Medication Dose Route Frequency Provider Last Rate Last Dose  . shugarcaine ophthalmic solution    PRN Tonny Branch, MD        ROS:  General: Negative for anorexia, weight loss, fever, chills, fatigue, weakness. Eyes: Negative for vision changes.  ENT: Negative for hoarseness, difficulty swallowing , nasal congestion. CV: Negative for chest pain, angina, palpitations, dyspnea on exertion, peripheral edema.  Respiratory: Negative for dyspnea at rest, dyspnea on exertion, cough, sputum, wheezing.  GI: See history of present illness. GU:  Negative for dysuria, hematuria, urinary incontinence, urinary  frequency, nocturnal urination.  MS: Negative for joint pain, low back pain.  Derm: Negative for rash or itching.  Neuro: Negative for weakness, abnormal sensation, seizure, frequent headaches, memory loss, confusion.  Psych: Negative for anxiety, depression, suicidal ideation, hallucinations.  Endo: Negative for unusual weight change.  Heme: Negative for bruising or bleeding.  Allergy: Negative for rash or hives.   Observations/Objective: Pleasant male in no acute distress.  Otherwise exam unavailable.  Assessment and Plan: Pleasant 85 year old gentleman with history of recurrent Schatzki ring requiring multiple esophageal dilations, somewhat diffusely narrowed esophagus as outlined.  EGD with esophageal dilation in December.  Significant improvement in his symptoms, occasional issues even with water or Ensure.  Feels like things close up and he has to just wait till it passes.  Overall doing better though. Denies any problems swallowing burgers, fries, pills. Will continue to monitor symptoms for now. If worsening, he will call and let us know. Continue pantoprazole once daily. Return to the office in six months.   Follow Up Instructions:    I discussed the assessment and treatment plan with the patient. The patient was provided an opportunity to ask questions and all were answered. The patient agreed with the plan and demonstrated an understanding of the instructions. AVS mailed to patient's home address.   The patient was advised to call back or seek an in-person evaluation if the symptoms worsen or if the condition fails to improve as anticipated.  I provided 15 minutes of non-face-to-face time during this encounter.   Peter Crouch, PA-C

## 2019-01-07 ENCOUNTER — Other Ambulatory Visit: Payer: Self-pay | Admitting: Internal Medicine

## 2019-01-08 ENCOUNTER — Other Ambulatory Visit: Payer: Self-pay | Admitting: Internal Medicine

## 2019-01-09 ENCOUNTER — Other Ambulatory Visit: Payer: Self-pay | Admitting: Internal Medicine

## 2019-02-01 ENCOUNTER — Other Ambulatory Visit: Payer: Self-pay

## 2019-02-01 NOTE — Patient Outreach (Signed)
[facility]) Care Management  02/01/2019  OTHMAN MASUR February 17, 1933 [id number]   Medication Adherence call to Mr. Peter [name] Hippa Identifiers Verify spoke with patient he is past due on Atorvastatin 40 mg patient explain he is no longer taking this medication doctor took him off.[name] is showing past due under [place].   [place] Management Direct Dial [phone]  Fax [phone] [email or web address]

## 2019-03-12 ENCOUNTER — Ambulatory Visit (INDEPENDENT_AMBULATORY_CARE_PROVIDER_SITE_OTHER): Payer: Medicare Other | Admitting: Urology

## 2019-03-12 DIAGNOSIS — N401 Enlarged prostate with lower urinary tract symptoms: Secondary | ICD-10-CM | POA: Diagnosis not present

## 2019-03-12 DIAGNOSIS — R972 Elevated prostate specific antigen [PSA]: Secondary | ICD-10-CM | POA: Diagnosis not present

## 2019-03-12 DIAGNOSIS — R351 Nocturia: Secondary | ICD-10-CM | POA: Diagnosis not present

## 2019-03-20 DIAGNOSIS — I872 Venous insufficiency (chronic) (peripheral): Secondary | ICD-10-CM | POA: Diagnosis not present

## 2019-03-20 DIAGNOSIS — D0439 Carcinoma in situ of skin of other parts of face: Secondary | ICD-10-CM | POA: Diagnosis not present

## 2019-03-20 DIAGNOSIS — C44329 Squamous cell carcinoma of skin of other parts of face: Secondary | ICD-10-CM | POA: Diagnosis not present

## 2019-03-25 DIAGNOSIS — I872 Venous insufficiency (chronic) (peripheral): Secondary | ICD-10-CM | POA: Diagnosis not present

## 2019-03-25 DIAGNOSIS — C44329 Squamous cell carcinoma of skin of other parts of face: Secondary | ICD-10-CM | POA: Diagnosis not present

## 2019-04-08 DIAGNOSIS — M6281 Muscle weakness (generalized): Secondary | ICD-10-CM | POA: Diagnosis not present

## 2019-04-08 DIAGNOSIS — M1991 Primary osteoarthritis, unspecified site: Secondary | ICD-10-CM | POA: Diagnosis not present

## 2019-04-10 DIAGNOSIS — C44329 Squamous cell carcinoma of skin of other parts of face: Secondary | ICD-10-CM | POA: Diagnosis not present

## 2019-05-06 DIAGNOSIS — C4442 Squamous cell carcinoma of skin of scalp and neck: Secondary | ICD-10-CM | POA: Diagnosis not present

## 2019-05-07 IMAGING — US US ABDOMEN LIMITED
1 series · 14 of 25 positions shown · non-contrast
Comparison: None.

CLINICAL DATA: Right upper quadrant abdominal pain for 2 weeks.

EXAM:
ULTRASOUND ABDOMEN LIMITED RIGHT UPPER QUADRANT

[Series 1: us abdomen limited · 0.18mm/px · 14 of 60 slices shown]
[im 1/60]
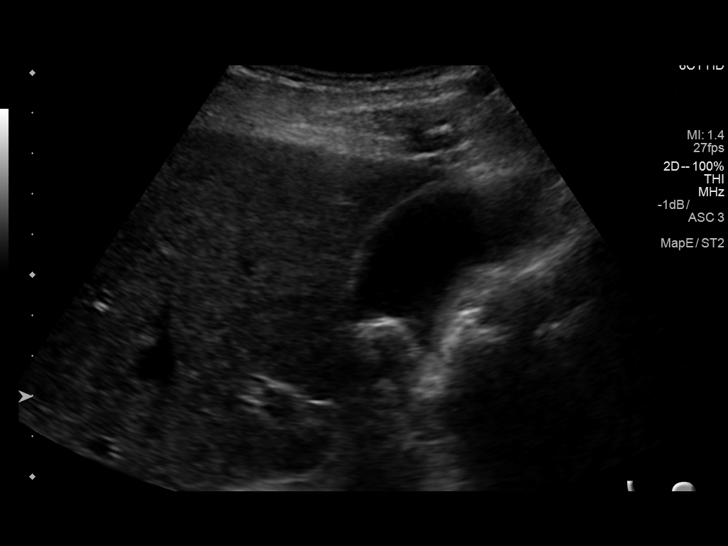
[im 5/60]
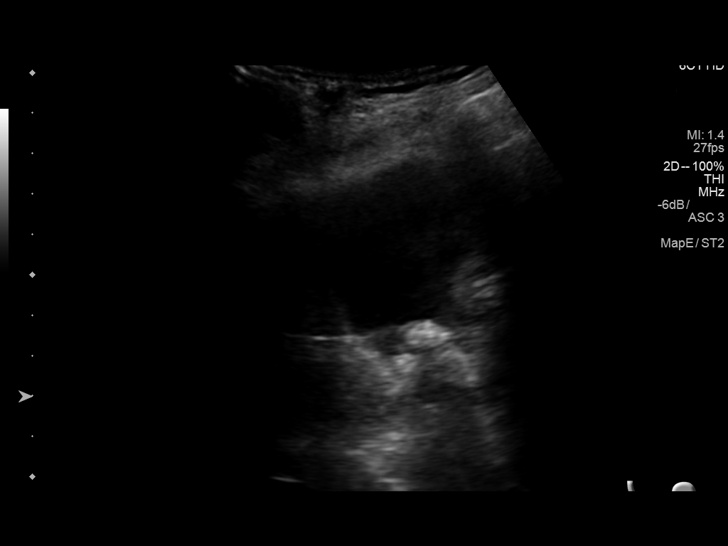
[im 10/60]
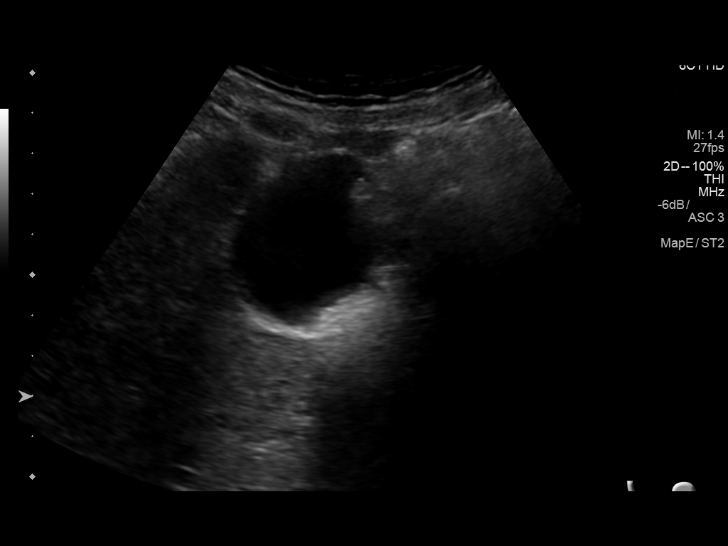
[im 15/60]
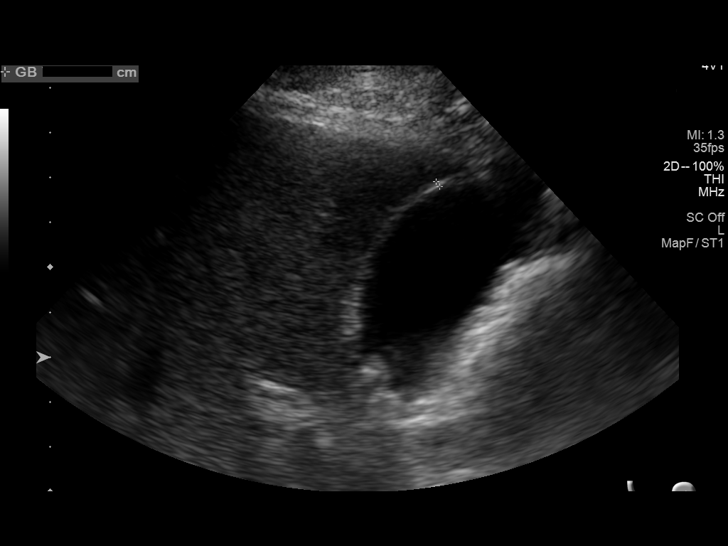
[im 20/60]
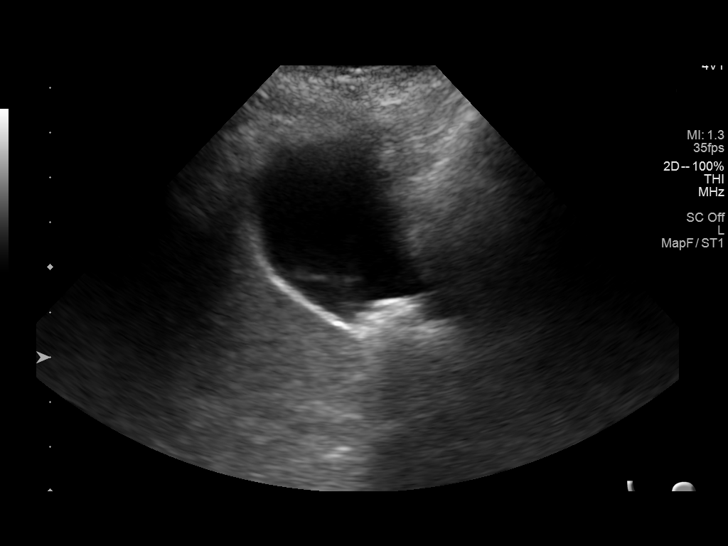
[im 23/60]
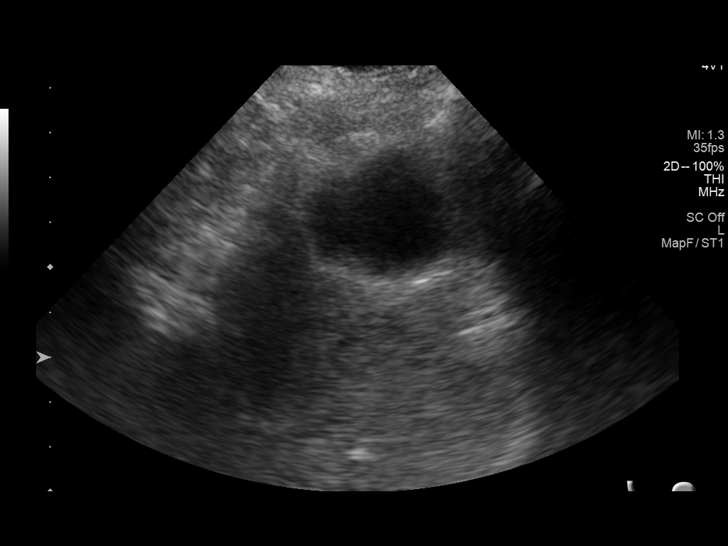
[im 28/60]
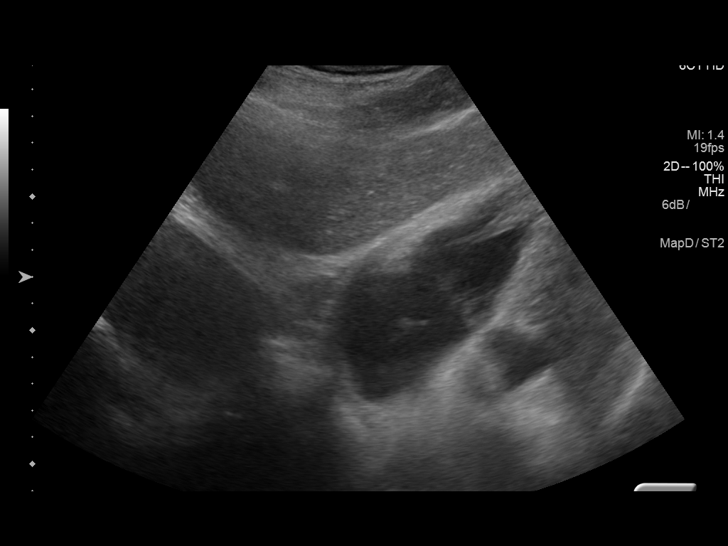
[im 32/60]
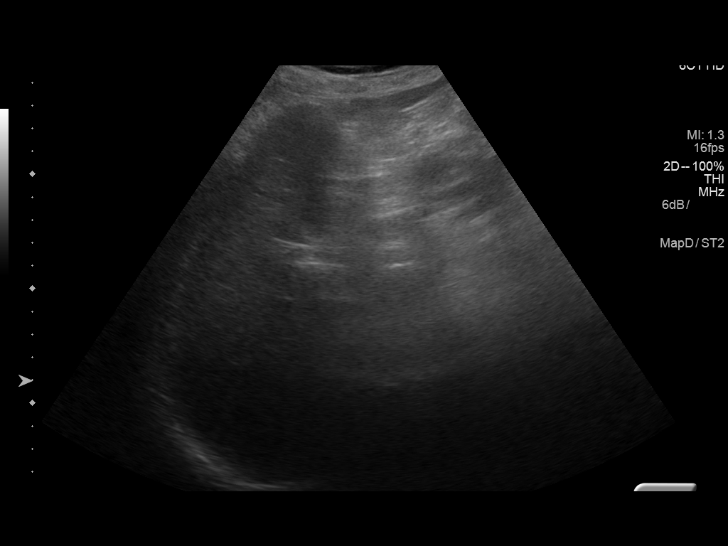
[im 37/60]
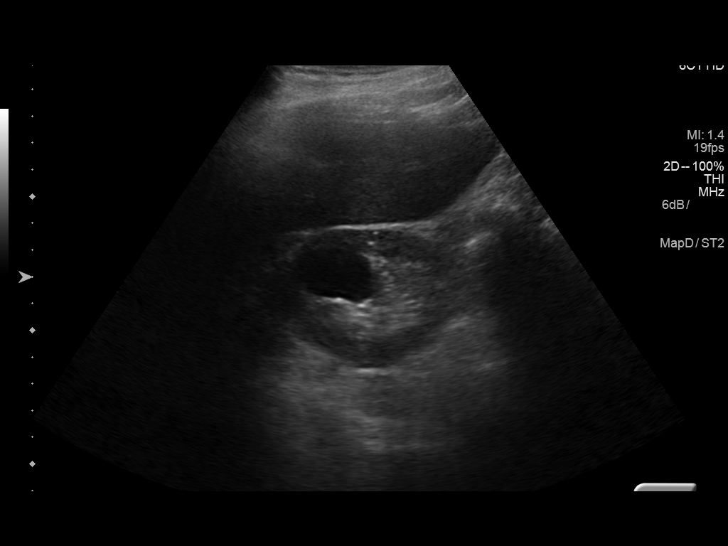
[im 40/60]
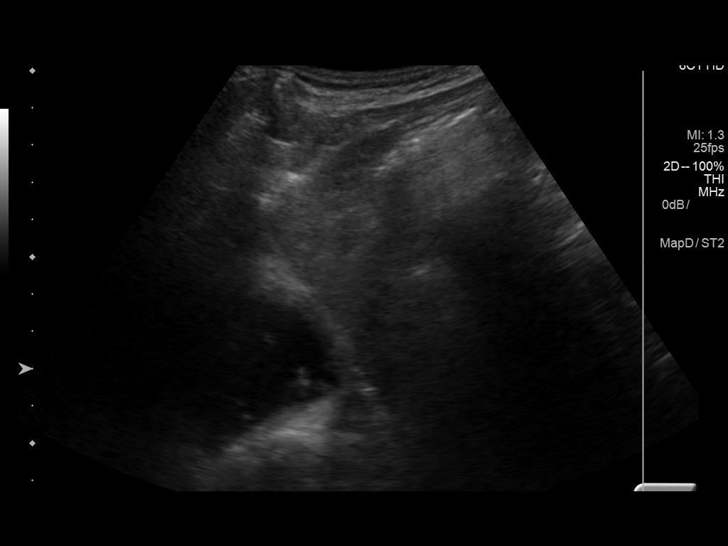
[im 45/60]
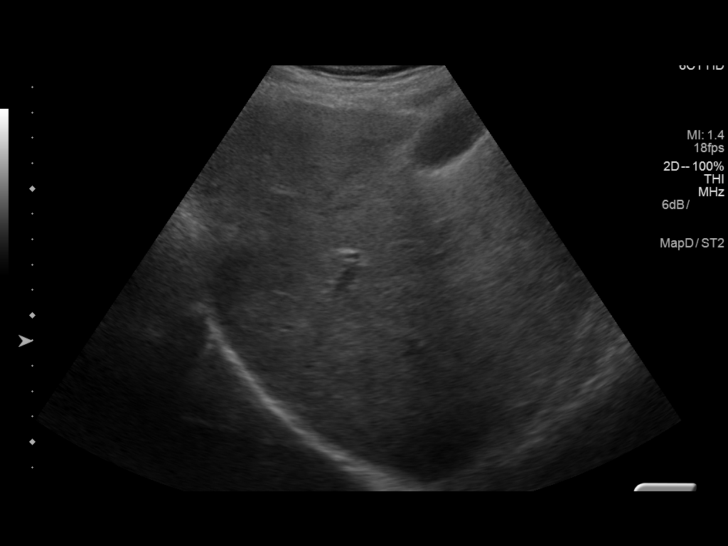
[im 50/60]
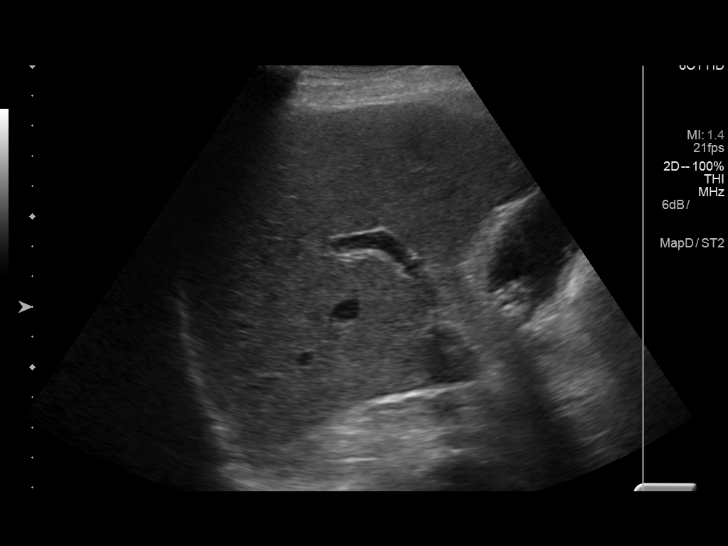
[im 55/60]
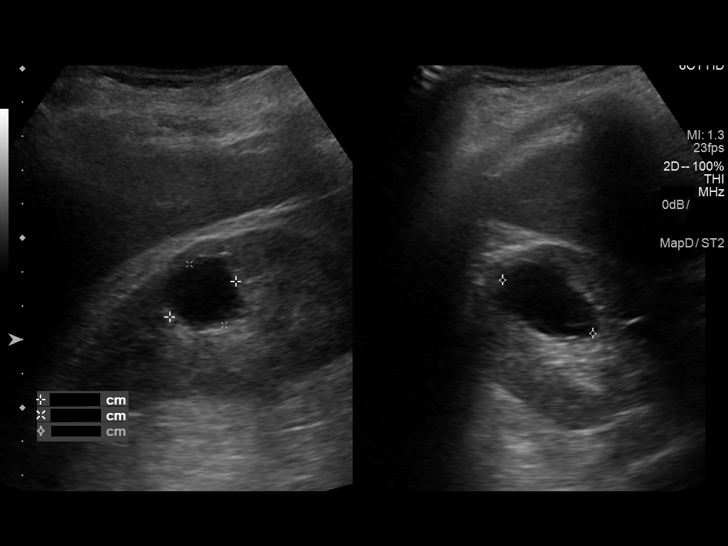
[im 60/60]
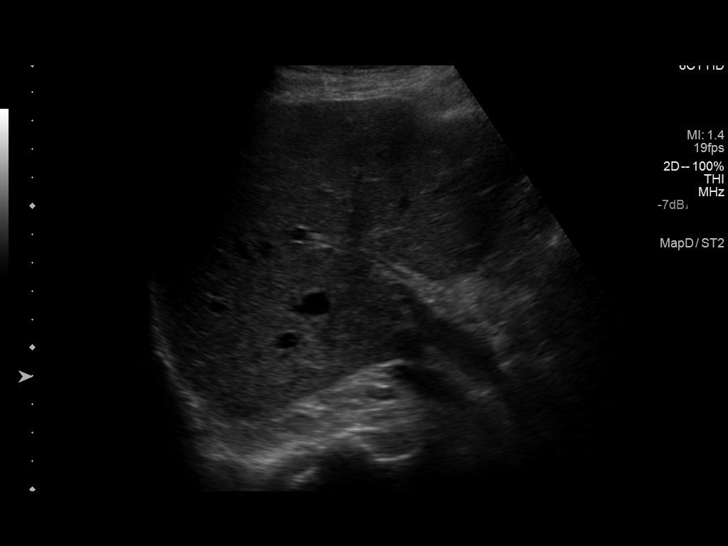

[14 of 25 positions shown; findings below may reference images not displayed]

FINDINGS: Gallbladder:

There is a 1.9 cm shadowing gallstone in the gallbladder with all
other smaller calculi. No gallbladder wall thickening,
pericholecystic fluid or sonographic Murphy sign.

Common bile duct:

Diameter: 3.5 mm.

Liver:

Normal echogenicity without focal lesion or biliary dilatation.
IMPRESSION: Cholelithiasis without sonographic findings for acute cholecystitis.

Normal sonographic appearance of the liver and no biliary
dilatation.

## 2019-06-14 ENCOUNTER — Emergency Department (HOSPITAL_COMMUNITY): Payer: Medicare Other

## 2019-06-14 ENCOUNTER — Inpatient Hospital Stay (HOSPITAL_COMMUNITY)
Admission: EM | Admit: 2019-06-14 | Discharge: 2019-06-17 | DRG: 194 | Disposition: A | Discharge status: Home Health | Payer: Medicare Other | Attending: Internal Medicine | Admitting: Internal Medicine

## 2019-06-14 ENCOUNTER — Encounter (HOSPITAL_COMMUNITY): Payer: Self-pay | Admitting: Emergency Medicine

## 2019-06-14 ENCOUNTER — Other Ambulatory Visit: Payer: Self-pay

## 2019-06-14 DIAGNOSIS — I10 Essential (primary) hypertension: Secondary | ICD-10-CM | POA: Diagnosis present

## 2019-06-14 DIAGNOSIS — J441 Chronic obstructive pulmonary disease with (acute) exacerbation: Secondary | ICD-10-CM | POA: Diagnosis not present

## 2019-06-14 DIAGNOSIS — F411 Generalized anxiety disorder: Secondary | ICD-10-CM | POA: Diagnosis present

## 2019-06-14 DIAGNOSIS — I779 Disorder of arteries and arterioles, unspecified: Secondary | ICD-10-CM | POA: Diagnosis present

## 2019-06-14 DIAGNOSIS — J189 Pneumonia, unspecified organism: Secondary | ICD-10-CM | POA: Diagnosis not present

## 2019-06-14 DIAGNOSIS — Z20828 Contact with and (suspected) exposure to other viral communicable diseases: Secondary | ICD-10-CM | POA: Diagnosis present

## 2019-06-14 DIAGNOSIS — R531 Weakness: Secondary | ICD-10-CM | POA: Diagnosis not present

## 2019-06-14 DIAGNOSIS — M21371 Foot drop, right foot: Secondary | ICD-10-CM | POA: Diagnosis present

## 2019-06-14 DIAGNOSIS — I2510 Atherosclerotic heart disease of native coronary artery without angina pectoris: Secondary | ICD-10-CM | POA: Diagnosis not present

## 2019-06-14 DIAGNOSIS — K219 Gastro-esophageal reflux disease without esophagitis: Secondary | ICD-10-CM | POA: Diagnosis present

## 2019-06-14 DIAGNOSIS — J449 Chronic obstructive pulmonary disease, unspecified: Secondary | ICD-10-CM | POA: Diagnosis not present

## 2019-06-14 DIAGNOSIS — I6522 Occlusion and stenosis of left carotid artery: Secondary | ICD-10-CM | POA: Diagnosis not present

## 2019-06-14 DIAGNOSIS — F17210 Nicotine dependence, cigarettes, uncomplicated: Secondary | ICD-10-CM | POA: Diagnosis present

## 2019-06-14 DIAGNOSIS — R05 Cough: Secondary | ICD-10-CM | POA: Diagnosis not present

## 2019-06-14 DIAGNOSIS — I6529 Occlusion and stenosis of unspecified carotid artery: Secondary | ICD-10-CM | POA: Diagnosis present

## 2019-06-14 DIAGNOSIS — I6523 Occlusion and stenosis of bilateral carotid arteries: Secondary | ICD-10-CM

## 2019-06-14 DIAGNOSIS — R0602 Shortness of breath: Secondary | ICD-10-CM | POA: Diagnosis not present

## 2019-06-14 LAB — CBC WITH DIFFERENTIAL/PLATELET
Abs Immature Granulocytes: 0.05 10*3/uL (ref 0.00–0.07)
Basophils Absolute: 0.1 10*3/uL (ref 0.0–0.1)
Basophils Relative: 1 %
Eosinophils Absolute: 0.2 10*3/uL (ref 0.0–0.5)
Eosinophils Relative: 1 %
HCT: 41.1 % (ref 39.0–52.0)
Hemoglobin: 13.9 g/dL (ref 13.0–17.0)
Immature Granulocytes: 0 %
Lymphocytes Relative: 11 %
Lymphs Abs: 1.6 10*3/uL (ref 0.7–4.0)
MCH: 31.9 pg (ref 26.0–34.0)
MCHC: 33.8 g/dL (ref 30.0–36.0)
MCV: 94.3 fL (ref 80.0–100.0)
Monocytes Absolute: 1.5 10*3/uL — ABNORMAL HIGH (ref 0.1–1.0)
Monocytes Relative: 10 %
Neutro Abs: 11.5 10*3/uL — ABNORMAL HIGH (ref 1.7–7.7)
Neutrophils Relative %: 77 %
Platelets: 363 10*3/uL (ref 150–400)
RBC: 4.36 MIL/uL (ref 4.22–5.81)
RDW: 12 % (ref 11.5–15.5)
WBC: 14.9 10*3/uL — ABNORMAL HIGH (ref 4.0–10.5)
nRBC: 0 % (ref 0.0–0.2)

## 2019-06-14 LAB — POC SARS CORONAVIRUS 2 AG -  ED: SARS Coronavirus 2 Ag: NEGATIVE

## 2019-06-14 LAB — BASIC METABOLIC PANEL
Anion gap: 10 (ref 5–15)
BUN: 9 mg/dL (ref 8–23)
CO2: 26 mmol/L (ref 22–32)
Calcium: 9.3 mg/dL (ref 8.9–10.3)
Chloride: 93 mmol/L — ABNORMAL LOW (ref 98–111)
Creatinine, Ser: 0.72 mg/dL (ref 0.61–1.24)
GFR calc Af Amer: >60 mL/min (ref >60)
GFR calc non Af Amer: >60 mL/min (ref >60)
Glucose, Bld: 129 mg/dL — ABNORMAL HIGH (ref 70–99)
Potassium: 4.2 mmol/L (ref 3.5–5.1)
Sodium: 129 mmol/L — ABNORMAL LOW (ref 135–145)

## 2019-06-14 MED ORDER — PREDNISONE 20 MG PO TABS
40.0000 mg | ORAL_TABLET | Freq: Every day | ORAL | Status: DC
  Administered 2019-06-16 – 2019-06-17 (×2): 40 mg via ORAL
  Filled 2019-06-14 (×2): qty 2

## 2019-06-14 MED ORDER — PREDNISONE 20 MG PO TABS
40.0000 mg | ORAL_TABLET | Freq: Once | ORAL | Status: AC
  Administered 2019-06-14: 40 mg via ORAL
  Filled 2019-06-14: qty 2

## 2019-06-14 MED ORDER — SODIUM CHLORIDE 0.9 % IV SOLN
500.0000 mg | INTRAVENOUS | Status: DC
  Administered 2019-06-15 – 2019-06-17 (×3): 500 mg via INTRAVENOUS
  Filled 2019-06-14 (×3): qty 500

## 2019-06-14 MED ORDER — ALPRAZOLAM 0.5 MG PO TABS
0.5000 mg | ORAL_TABLET | Freq: Every day | ORAL | Status: DC
  Administered 2019-06-14 – 2019-06-16 (×3): 0.5 mg via ORAL
  Filled 2019-06-14 (×3): qty 1

## 2019-06-14 MED ORDER — IPRATROPIUM-ALBUTEROL 20-100 MCG/ACT IN AERS
1.0000 | INHALATION_SPRAY | Freq: Four times a day (QID) | RESPIRATORY_TRACT | Status: DC
  Filled 2019-06-14: qty 4

## 2019-06-14 MED ORDER — AMLODIPINE BESYLATE 5 MG PO TABS
5.0000 mg | ORAL_TABLET | Freq: Every day | ORAL | Status: DC
  Administered 2019-06-15 – 2019-06-17 (×3): 5 mg via ORAL
  Filled 2019-06-14 (×3): qty 1

## 2019-06-14 MED ORDER — ASPIRIN EC 81 MG PO TBEC
81.0000 mg | DELAYED_RELEASE_TABLET | Freq: Every day | ORAL | Status: DC
  Administered 2019-06-15 – 2019-06-17 (×3): 81 mg via ORAL
  Filled 2019-06-14 (×3): qty 1

## 2019-06-14 MED ORDER — IPRATROPIUM-ALBUTEROL 0.5-2.5 (3) MG/3ML IN SOLN
3.0000 mL | Freq: Four times a day (QID) | RESPIRATORY_TRACT | Status: DC
  Administered 2019-06-14 – 2019-06-17 (×8): 3 mL via RESPIRATORY_TRACT
  Filled 2019-06-14 (×8): qty 3

## 2019-06-14 MED ORDER — IPRATROPIUM BROMIDE HFA 17 MCG/ACT IN AERS: 2.0000 | INHALATION_SPRAY | Freq: Once | RESPIRATORY_TRACT | Status: DC

## 2019-06-14 MED ORDER — PANTOPRAZOLE SODIUM 40 MG PO TBEC
40.0000 mg | DELAYED_RELEASE_TABLET | Freq: Every day | ORAL | Status: DC
  Administered 2019-06-15 – 2019-06-16 (×2): 40 mg via ORAL
  Filled 2019-06-14 (×3): qty 1

## 2019-06-14 MED ORDER — ENOXAPARIN SODIUM 40 MG/0.4ML ~~LOC~~ SOLN
40.0000 mg | SUBCUTANEOUS | Status: DC
  Administered 2019-06-14 – 2019-06-16 (×3): 40 mg via SUBCUTANEOUS
  Filled 2019-06-14 (×3): qty 0.4

## 2019-06-14 MED ORDER — FLUOXETINE HCL 10 MG PO CAPS
10.0000 mg | ORAL_CAPSULE | Freq: Every day | ORAL | Status: DC
  Administered 2019-06-15 – 2019-06-17 (×3): 10 mg via ORAL
  Filled 2019-06-14 (×3): qty 1

## 2019-06-14 MED ORDER — SODIUM CHLORIDE 0.9 % IV SOLN
2.0000 g | INTRAVENOUS | Status: DC
  Administered 2019-06-14 – 2019-06-16 (×3): 2 g via INTRAVENOUS
  Filled 2019-06-14 (×3): qty 20

## 2019-06-14 MED ORDER — METHYLPREDNISOLONE SODIUM SUCC 125 MG IJ SOLR
60.0000 mg | Freq: Four times a day (QID) | INTRAMUSCULAR | Status: AC
  Administered 2019-06-14 – 2019-06-15 (×4): 60 mg via INTRAVENOUS
  Filled 2019-06-14 (×4): qty 2

## 2019-06-14 MED ORDER — IPRATROPIUM-ALBUTEROL 20-100 MCG/ACT IN AERS
2.0000 | INHALATION_SPRAY | Freq: Once | RESPIRATORY_TRACT | Status: AC
  Administered 2019-06-14: 2 via RESPIRATORY_TRACT
  Filled 2019-06-14: qty 4

## 2019-06-14 MED ORDER — LISINOPRIL 10 MG PO TABS
40.0000 mg | ORAL_TABLET | Freq: Every day | ORAL | Status: DC
  Administered 2019-06-15 – 2019-06-17 (×3): 40 mg via ORAL
  Filled 2019-06-14 (×4): qty 4

## 2019-06-14 MED ORDER — ALBUTEROL SULFATE HFA 108 (90 BASE) MCG/ACT IN AERS: 2.0000 | INHALATION_SPRAY | Freq: Once | RESPIRATORY_TRACT | Status: DC

## 2019-06-14 MED ORDER — LACTULOSE 10 GM/15ML PO SOLN: 20.0000 g | Freq: Every evening | ORAL | Status: DC | PRN

## 2019-06-14 MED ORDER — FINASTERIDE 5 MG PO TABS
5.0000 mg | ORAL_TABLET | Freq: Every day | ORAL | Status: DC
  Administered 2019-06-15 – 2019-06-16 (×2): 5 mg via ORAL
  Filled 2019-06-14 (×3): qty 1

## 2019-06-14 MED ORDER — MOMETASONE FURO-FORMOTEROL FUM 200-5 MCG/ACT IN AERO
1.0000 | INHALATION_SPRAY | Freq: Two times a day (BID) | RESPIRATORY_TRACT | Status: DC
  Administered 2019-06-15 – 2019-06-17 (×4): 1 via RESPIRATORY_TRACT
  Filled 2019-06-14: qty 8.8

## 2019-06-14 MED ORDER — DOXYCYCLINE HYCLATE 100 MG PO TABS
100.0000 mg | ORAL_TABLET | Freq: Once | ORAL | Status: AC
  Administered 2019-06-14: 100 mg via ORAL
  Filled 2019-06-14: qty 1

## 2019-06-14 MED ORDER — ALBUTEROL SULFATE (2.5 MG/3ML) 0.083% IN NEBU: 2.5000 mg | INHALATION_SOLUTION | RESPIRATORY_TRACT | Status: DC | PRN

## 2019-06-14 NOTE — Progress Notes (Signed)
Gave patient combivent inhaler 4 puffs with spacer. Patient Is Covid negative by POC test. Still waiting for other test to return takes 6 to 12 hrs. Will give neb if absolutely necessary but otherwise will wait as he is in standard room. Dulera 200 is not avalibale in hospital.

## 2019-06-14 NOTE — ED Triage Notes (Signed)
Cough x 2 days.  Sent by pcp for covid test and chest xray.  Pt denies shortness of breath

## 2019-06-14 NOTE — ED Provider Notes (Signed)
[facility] EMERGENCY DEPARTMENT Provider Note   CSN: [id number] Arrival date & time: 06/14/19  1401     History   Chief Complaint Chief Complaint  Patient presents with  . Cough    HPI Peter [name] is a 85 y.o. male.  Presents to ER with chief complaint of cough.  Patient states has had symptoms for the past 2 to 3 days, cough is dry, nonproductive, nonbloody.  No associated fever, denies difficulty breathing but has noted some wheezing.  States he has past history of smoking, but does not take inhalers on a regular basis.  Denies prior diagnosis of COPD.  No associated chest pain.  No known exposures to COVID-19.     HPI  Past Medical History:  Diagnosis Date  . Anxiety   . Arthritis   . BPH (benign prostatic hyperplasia)   . Chronic pain   . Complication of anesthesia    had an anxiety attack once  . Depression   . GERD (gastroesophageal reflux disease)   . Hiatal hernia    small  . History of nuclear stress test    Nuc 5/19: soft tissue attenuation; no ischemia or scar, EF 72; Low Risk  . HTN (hypertension)   . Internal carotid artery stenosis, left   . Prostate disease    h/o elevated PSA, neg bx, [facility]  . Sciatica   . Wears glasses   . Wears partial dentures     Patient Active Problem List   Diagnosis Date Noted  . PAD (peripheral artery disease) ([place]) 11/28/2017  . Hyperlipidemia 11/28/2017  . Essential hypertension 11/28/2017  . Atherosclerosis of native arteries of extremity with rest pain ([place]) 10/18/2017  . Carotid stenosis 03/20/2017  . Bilateral carotid artery disease ([place]) 03/17/2017  . Leukocytosis 01/18/2017  . Irregular heart beat 04/03/2013  . Constipation 05/16/2011  . Esophageal dysphagia 11/03/2010  . GERD (gastroesophageal reflux disease) 11/03/2010  . Schatzki's ring 11/03/2010    Past Surgical History:  Procedure Laterality Date  . ABDOMINAL AORTOGRAM W/LOWER EXTREMITY N/A 10/26/2017   Procedure: ABDOMINAL AORTOGRAM  W/LOWER EXTREMITY;  Surgeon: Waynetta Sandy, MD;  Location: [place] [facility];  Service: Cardiovascular;  Laterality: N/A;  bilateral  . BALLOON DILATION  04/23/2018   Procedure: BALLOON DILATION;  Surgeon: Daneil Dolin, MD;  [facility];  Service: Endoscopy;;  esophagus  . BIOPSY  01/29/2014   Procedure: BIOPSY;  Surgeon: Daneil Dolin, MD;  [facility];  Service: Endoscopy;;  Gastric and Esophageal  . BIOPSY  06/14/2018   Procedure: BIOPSY;  Surgeon: Daneil Dolin, MD;  [facility];  Service: Endoscopy;;  esophagus  . CAROTID ENDARTERECTOMY Left 03/20/2017  . CATARACT EXTRACTION W/PHACO  05/21/2012   Procedure: CATARACT EXTRACTION PHACO AND INTRAOCULAR LENS PLACEMENT (IOC);  Surgeon: Tonny Branch, MD;  Location: [facility];  Service: Ophthalmology;  Laterality: Right;  CDE 19.34  . COLONOSCOPY  12/2004   diverticulosis, hyperplastic polyps, hemorrhoids  . ENDARTERECTOMY Left 03/20/2017   Procedure: ENDARTERECTOMY CAROTID-LEFT;  Surgeon: Conrad Fairview, MD;  Location: [place];  Service: Vascular;  Laterality: Left;  . ESOPHAGOGASTRODUODENOSCOPY  2003   schatzki ring, small vascular abnormality of duodenal bulb ?Dieulafoy's lesion (ablated)  . ESOPHAGOGASTRODUODENOSCOPY  11/10/10   Dr. Gala Romney dilation 2 rings with 25F  . ESOPHAGOGASTRODUODENOSCOPY N/A 01/29/2014   Dr.Rourk- multiple esophageal rings/webs- s/p dialation. small hiatal hernia. abnormal gastric mucosa. bx= mild chronic inactive gastritis (oxyntic mucosa), squamous mucosa with  epithelial changes consistent with reflux related injury.  . ESOPHAGOGASTRODUODENOSCOPY (EGD) WITH PROPOFOL N/A 04/23/2018   Procedure: ESOPHAGOGASTRODUODENOSCOPY (EGD) WITH PROPOFOL;  Surgeon: Daneil Dolin, MD;  [facility];  Service: Endoscopy;  Laterality: N/A;  2:00pm  . ESOPHAGOGASTRODUODENOSCOPY (EGD) WITH PROPOFOL N/A 06/14/2018   Dr. Gala Romney: Obstructing Schatzki ring, status post dilation  medium-sized hiatal hernia, biopsies from the mid to distal esophagus obtained which were benign.  Marland Kitchen MALONEY DILATION N/A 06/14/2018   Procedure: Venia Minks DILATION;  Surgeon: Daneil Dolin, MD;  [facility];  Service: Endoscopy;  Laterality: N/A;  . MULTIPLE TOOTH EXTRACTIONS    . PATCH ANGIOPLASTY Left 03/20/2017  . PATCH ANGIOPLASTY Left 03/20/2017   Procedure: PATCH ANGIOPLASTY;  Surgeon: Conrad Hale, MD;  Location: [facility] OR;  Service: Vascular;  Laterality: Left;        Home Medications    Prior to Admission medications   Medication Sig Start Date End Date Taking? Authorizing Provider  ALPRAZolam Duanne Moron) 0.5 MG tablet Take 0.5 mg by mouth at bedtime.    Yes [provider]  amLODipine (NORVASC) 5 MG tablet Take 5 mg by mouth daily.  11/28/16  Yes [provider]  aspirin EC 81 MG tablet Take 81 mg by mouth daily.    Yes [provider]  finasteride (PROSCAR) 5 MG tablet Take 5 mg by mouth daily after lunch.    Yes [provider]  FLUoxetine (PROZAC) 10 MG capsule Take 10 mg by mouth daily. 06/10/19  Yes [provider]  lactulose (CHRONULAC) 10 GM/15ML solution TAKE TWO TABLESPOONFULS AT BEDTIME AS NEEDED. Patient taking differently: Take 20 g by mouth at bedtime as needed for mild constipation. TAKE TWO TABLESPOONFULS AT BEDTIME AS NEEDED. 10/04/18  Yes Mahala Menghini, PA-C  lisinopril (PRINIVIL,ZESTRIL) 40 MG tablet Take 40 mg by mouth daily.  12/08/16  Yes [provider]  pantoprazole (PROTONIX) 40 MG tablet Take 1 tablet (40 mg total) by mouth daily before breakfast. 12/10/18  Yes Mahala Menghini, PA-C    Family History Family History  Problem Relation Age of Onset  . Stroke Father 63       deceased  . Heart attack Mother   . Colon cancer Neg Hx     Social History Social History   Tobacco Use  . Smoking status: Current Every Day Smoker    Packs/day: 1.00    Years: 60.00    Pack years: 60.00    Types:  Cigarettes  . Smokeless tobacco: Never Used  Substance Use Topics  . Alcohol use: No    Comment: quit 10  years ago  . Drug use: No     Allergies   Penicillins   Review of Systems Review of Systems  Constitutional: Positive for fatigue. Negative for chills and fever.  HENT: Negative for ear pain and sore throat.   Eyes: Negative for pain and visual disturbance.  Respiratory: Positive for cough. Negative for shortness of breath.   Cardiovascular: Negative for chest pain and palpitations.  Gastrointestinal: Negative for abdominal pain and vomiting.  Genitourinary: Negative for dysuria and hematuria.  Musculoskeletal: Negative for arthralgias and back pain.  Skin: Negative for color change and rash.  Neurological: Negative for seizures and syncope.  All other systems reviewed and are negative.    Physical Exam Updated Vital Signs BP (!) 157/56 (BP Location: Right Arm)   Pulse (!) 107   Temp 98.6 F (37 C) (Oral)   Resp 20  Ht 5\' 10"  (1.778 m)   Wt 73.2 kg   SpO2 95%   BMI 23.16 kg/m   Physical Exam Vitals signs and nursing note reviewed.  Constitutional:      Appearance: He is well-developed.  HENT:     Head: Normocephalic and atraumatic.  Eyes:     Conjunctiva/sclera: Conjunctivae normal.  Neck:     Musculoskeletal: Neck supple.  Cardiovascular:     Rate and Rhythm: Normal rate and regular rhythm.     Heart sounds: No murmur.  Pulmonary:     Effort: No respiratory distress.     Comments: No increased work of breathing, did note bilateral expiratory wheeze, no crackles Abdominal:     Palpations: Abdomen is soft.     Tenderness: There is no abdominal tenderness.  Musculoskeletal:        General: No swelling or tenderness.  Skin:    General: Skin is warm and dry.     Capillary Refill: Capillary refill takes less than 2 seconds.  Neurological:     General: No focal deficit present.     Mental Status: He is alert and oriented to person, place, and time.   Psychiatric:        Mood and Affect: Mood normal.        Behavior: Behavior normal.      ED Treatments / Results  Labs (all labs ordered are listed, but only abnormal results are displayed) Labs Reviewed  CBC WITH DIFFERENTIAL/PLATELET - Abnormal; Notable for the following components:      Result Value   WBC 14.9 (*)    Neutro Abs 11.5 (*)    Monocytes Absolute 1.5 (*)    All other components within normal limits  SARS CORONAVIRUS 2 (TAT 6-24 HRS)  BASIC METABOLIC PANEL  POC SARS CORONAVIRUS 2 AG -  ED    EKG EKG Interpretation  Date/Time:  Friday June 14 2019 14:51:47 EST Ventricular Rate:  98 PR Interval:    QRS Duration: 91 QT Interval:  343 QTC Calculation: 438 R Axis:   0 Text Interpretation: Sinus rhythm Probable inferior infarct, old Confirmed by Madalyn Rob [phone]) on 06/14/2019 3:10:37 PM   Radiology Dg Chest Portable 1 View  Result Date: 06/14/2019 CLINICAL DATA:  Cough, concern for COVID EXAM: PORTABLE CHEST 1 VIEW COMPARISON:  Radiograph of the chest and ribs 01/19/2017 FINDINGS: Heart size within normal limits.  Aortic atherosclerosis. Mid to lower lung predominant bilateral interstitial prominence. Additional ill-defined left basilar opacity. No evidence of pneumothorax or sizable pleural effusion. No acute bony abnormality. IMPRESSION: Mid to lower lung predominant bilateral interstitial prominence as well as ill-defined left basilar opacity. Findings most concerning for pneumonia given provided history. Radiographic follow-up to resolution recommended. Aortic atherosclerosis. Electronically Signed   By: Kellie Simmering DO   On: 06/14/2019 14:52    Procedures Procedures (including critical care time)  Medications Ordered in ED Medications  predniSONE (DELTASONE) tablet 40 mg (40 mg Oral Given 06/14/19 1452)  Ipratropium-Albuterol (COMBIVENT) respimat 2 puff (2 puffs Inhalation Given 06/14/19 1551)     Initial Impression / Assessment and Plan / ED  Course  I have reviewed the triage vital signs and the nursing notes.  Pertinent labs & imaging results that were available during my care of the patient were reviewed by me and considered in my medical decision making (see chart for details).        84 year old male presented to the ER with chief complaint of cough.  On exam  well-appearing in no distress.  Noted bilateral expiratory wheezing.  CXR ordered to evaluate for pneumonia, labs ordered and pending.  Inhaler ordered, steroids ordered for suspected reactive airway process.  While awaiting work-up, patient signed out to Dr. Sedonia Small.  I please refer to his note for final plan disposition.  Final Clinical Impressions(s) / ED Diagnoses   Final diagnoses:  COPD exacerbation [facility])    ED Discharge Orders    None       Lucrezia Starch, MD 06/14/19 [phone]

## 2019-06-14 NOTE — ED Provider Notes (Signed)
  Provider Note MRN:  [id number]  Arrival date & time: 06/14/19    ED Course and Medical Decision Making  Assumed care from Dr. Roslynn Amble at shift change.  Cough, wheezing, x-ray demonstrating pneumonia.  Well-appearing, awaiting labs, ambulation with O2, candidate for discharge.  5 PM update: Labs revealing hyponatremia.  Patient is endorsing a new right foot drop for the past week, making his baseline poor ambulation status even more of a concern.  On top of this, patient is quite dyspneic with ambulation.  O2 sats held at 91% but very symptomatic.  Given this constellation of findings will admit to hospital service for observation for pneumonia and suspected underlying obstructive airway disease.  Procedures  Final Clinical Impressions(s) / ED Diagnoses     ICD-10-CM   1. COPD exacerbation ([place])  J44.1   2. Community acquired pneumonia, unspecified laterality  J18.9     ED Discharge Orders    None      Discharge Instructions   None     Barth Kirks. Sedonia Small, [place] [email or web address]    Maudie Flakes, MD 06/14/19 [phone]

## 2019-06-14 NOTE — H&P (Signed)
History and Physical  Peter [name] [id number] DOB: Feb 04, 1933 DOA: 06/14/2019  Referring physician: Dr Estelle Grumbles, ED physician PCP: Redmond School, MD  Outpatient Specialists:   Patient Coming From: home  Chief Complaint: SOB, cough  HPI: Peter [name] is a 85 y.o. male with a history of hypertension, GERD, prostate disease, left internal carotid stenosis.  Patient seen for shortness of breath that is worse with exertion and improved with rest.  He is also had a cough with coarse wheezing.  This has been going on for 2 days and has been worsening.  Patient denies previous diagnosis of COPD, however he is a smoker with 62-pack-year history.  Additionally, he complains of right foot drop that started a week ago.  Emergency Department Course: Chest x-ray shows developing pneumonia.  White count 14.  Patient severely neck with ambulation and a drop in his pulse ox to 90.  Review of Systems:   Pt denies any fevers, chills, nausea, vomiting, diarrhea, constipation, abdominal pain, shortness of breath, dyspnea on exertion, orthopnea, cough, wheezing, palpitations, headache, vision changes, lightheadedness, dizziness, melena, rectal bleeding.  Review of systems are otherwise negative  Past Medical History:  Diagnosis Date  . Anxiety   . Arthritis   . BPH (benign prostatic hyperplasia)   . Chronic pain   . Complication of anesthesia    had an anxiety attack once  . Depression   . GERD (gastroesophageal reflux disease)   . Hiatal hernia    small  . History of nuclear stress test    Nuc 5/19: soft tissue attenuation; no ischemia or scar, EF 72; Low Risk  . HTN (hypertension)   . Internal carotid artery stenosis, left   . Prostate disease    h/o elevated PSA, neg bx, [facility]  . Sciatica   . Wears glasses   . Wears partial dentures    Past Surgical History:  Procedure Laterality Date  . ABDOMINAL AORTOGRAM W/LOWER EXTREMITY N/A 10/26/2017   Procedure: ABDOMINAL AORTOGRAM  W/LOWER EXTREMITY;  Surgeon: Waynetta Sandy, MD;  Location: [place] [facility];  Service: Cardiovascular;  Laterality: N/A;  bilateral  . BALLOON DILATION  04/23/2018   Procedure: BALLOON DILATION;  Surgeon: Daneil Dolin, MD;  [facility];  Service: Endoscopy;;  esophagus  . BIOPSY  01/29/2014   Procedure: BIOPSY;  Surgeon: Daneil Dolin, MD;  [facility];  Service: Endoscopy;;  Gastric and Esophageal  . BIOPSY  06/14/2018   Procedure: BIOPSY;  Surgeon: Daneil Dolin, MD;  [facility];  Service: Endoscopy;;  esophagus  . CAROTID ENDARTERECTOMY Left 03/20/2017  . CATARACT EXTRACTION W/PHACO  05/21/2012   Procedure: CATARACT EXTRACTION PHACO AND INTRAOCULAR LENS PLACEMENT (IOC);  Surgeon: Tonny Branch, MD;  [facility];  Service: Ophthalmology;  Laterality: Right;  CDE 19.34  . COLONOSCOPY  12/2004   diverticulosis, hyperplastic polyps, hemorrhoids  . ENDARTERECTOMY Left 03/20/2017   Procedure: ENDARTERECTOMY CAROTID-LEFT;  Surgeon: Conrad [place]view [place], MD;  Location: [place];  Service: Vascular;  Laterality: Left;  . ESOPHAGOGASTRODUODENOSCOPY  2003   schatzki ring, small vascular abnormality of duodenal bulb ?Dieulafoy's lesion (ablated)  . ESOPHAGOGASTRODUODENOSCOPY  11/10/10   Dr. Gala Romney dilation 2 rings with 40F  . ESOPHAGOGASTRODUODENOSCOPY N/A 01/29/2014   Dr.Rourk- multiple esophageal rings/webs- s/p dialation. small hiatal hernia. abnormal gastric mucosa. bx= mild chronic inactive gastritis (oxyntic mucosa), squamous mucosa with epithelial changes consistent with reflux related injury.  . ESOPHAGOGASTRODUODENOSCOPY (EGD) WITH PROPOFOL N/A 04/23/2018  Procedure: ESOPHAGOGASTRODUODENOSCOPY (EGD) WITH PROPOFOL;  Surgeon: Daneil Dolin, MD;  [facility];  Service: Endoscopy;  Laterality: N/A;  2:00pm  . ESOPHAGOGASTRODUODENOSCOPY (EGD) WITH PROPOFOL N/A 06/14/2018   Dr. Gala Romney: Obstructing Schatzki ring, status post dilation  medium-sized hiatal hernia, biopsies from the mid to distal esophagus obtained which were benign.  Marland Kitchen MALONEY DILATION N/A 06/14/2018   Procedure: Venia Minks DILATION;  Surgeon: Daneil Dolin, MD;  [facility];  Service: Endoscopy;  Laterality: N/A;  . MULTIPLE TOOTH EXTRACTIONS    . PATCH ANGIOPLASTY Left 03/20/2017  . PATCH ANGIOPLASTY Left 03/20/2017   Procedure: PATCH ANGIOPLASTY;  Surgeon: Conrad Frankfort, MD;  Location: [facility];  Service: Vascular;  Laterality: Left;   Social History:  reports that he has been smoking cigarettes. He has a 60.00 pack-year smoking history. He has never used smokeless tobacco. He reports that he does not drink alcohol or use drugs. Patient lives at home  Allergies  Allergen Reactions  . Penicillins Rash    Has patient had a PCN reaction causing immediate rash, facial/tongue/throat swelling, SOB or lightheadedness with hypotension: Yes Has patient had a PCN reaction causing severe rash involving mucus membranes or skin necrosis: No Has patient had a PCN reaction that required hospitalization: No Has patient had a PCN reaction occurring within the last 10 years: No If all of the above answers are "NO", then may proceed with Cephalosporin use.     Family History  Problem Relation Age of Onset  . Stroke Father 55       deceased  . Heart attack Mother   . Colon cancer Neg Hx       Prior to Admission medications   Medication Sig Start Date End Date Taking? Authorizing Provider  ALPRAZolam Duanne Moron) 0.5 MG tablet Take 0.5 mg by mouth at bedtime.    Yes [provider]  amLODipine (NORVASC) 5 MG tablet Take 5 mg by mouth daily.  11/28/16  Yes [provider]  aspirin EC 81 MG tablet Take 81 mg by mouth daily.    Yes [provider]  finasteride (PROSCAR) 5 MG tablet Take 5 mg by mouth daily after lunch.    Yes [provider]  FLUoxetine (PROZAC) 10 MG capsule Take 10 mg by mouth daily. 06/10/19  Yes [provider]  lactulose (CHRONULAC) 10 GM/15ML solution TAKE TWO TABLESPOONFULS AT BEDTIME AS NEEDED. Patient taking differently: Take 20 g by mouth at bedtime as needed for mild constipation. TAKE TWO TABLESPOONFULS AT BEDTIME AS NEEDED. 10/04/18  Yes Mahala Menghini, PA-C  lisinopril (PRINIVIL,ZESTRIL) 40 MG tablet Take 40 mg by mouth daily.  12/08/16  Yes [provider]  pantoprazole (PROTONIX) 40 MG tablet Take 1 tablet (40 mg total) by mouth daily before breakfast. 12/10/18  Yes Mahala Menghini, PA-C    Physical Exam: BP (!) 157/56 (BP Location: Right Arm)   Pulse (!) 107   Temp 98.6 F (37 C) (Oral)   Resp 20   Ht 5\' 10"  (1.778 m)   Wt 73.2 kg   SpO2 95%   BMI 23.16 kg/m   . General: Elderly male. Awake and alert and oriented x3. No acute cardiopulmonary distress.  Marland Kitchen HEENT: Normocephalic atraumatic.  Right and left ears normal in appearance.  Pupils equal, round, reactive to light. Extraocular muscles are intact. Sclerae anicteric and noninjected.  Moist mucosal membranes. No mucosal lesions.  . Neck: Neck supple without lymphadenopathy. No carotid bruits. No masses  palpated.  . Cardiovascular: Regular rate with normal S1-S2 sounds. No murmurs, rubs, gallops auscultated. No JVD.  Marland Kitchen Respiratory: Prolonged exhalation phase.  Coarse wheezing throughout. No accessory muscle use. . Abdomen: Soft, nontender, nondistended. Active bowel sounds. No masses or hepatosplenomegaly  . Skin: No rashes, lesions, or ulcerations.  Dry, warm to touch. 2+ dorsalis pedis and radial pulses. . Musculoskeletal: No calf or leg pain. All major joints not erythematous nontender.  No upper or lower joint deformation.  Good ROM.  No contractures  . Psychiatric: Intact judgment and insight. Pleasant and cooperative. . Neurologic: No focal neurological deficits. Strength is 5/5 and symmetric in upper and lower extremities.  Cranial nerves II through XII are grossly intact.  Positive right foot drop            Labs on Admission: I have personally reviewed following labs and imaging studies  CBC: Recent Labs  Lab 06/14/19 1514  WBC 14.9*  NEUTROABS 11.5*  HGB 13.9  HCT 41.1  MCV 94.3  PLT [id number]   Basic Metabolic Panel: Recent Labs  Lab 06/14/19 1514  NA 129*  K 4.2  CL 93*  CO2 26  GLUCOSE 129*  BUN 9  CREATININE 0.72  CALCIUM 9.3   GFR: Estimated Creatinine Clearance: 71 mL/min (by C-G formula based on SCr of 0.72 mg/dL). Liver Function Tests: No results for input(s): AST, ALT, ALKPHOS, BILITOT, PROT, ALBUMIN in the last 168 hours. No results for input(s): LIPASE, AMYLASE in the last 168 hours. No results for input(s): AMMONIA in the last 168 hours. Coagulation Profile: No results for input(s): INR, PROTIME in the last 168 hours. Cardiac Enzymes: No results for input(s): CKTOTAL, CKMB, CKMBINDEX, TROPONINI in the last 168 hours. BNP (last 3 results) No results for input(s): PROBNP in the last 8760 hours. HbA1C: No results for input(s): HGBA1C in the last 72 hours. CBG: No results for input(s): GLUCAP in the last 168 hours. Lipid Profile: No results for input(s): CHOL, HDL, LDLCALC, TRIG, CHOLHDL, LDLDIRECT in the last 72 hours. Thyroid Function Tests: No results for input(s): TSH, T4TOTAL, FREET4, T3FREE, THYROIDAB in the last 72 hours. Anemia Panel: No results for input(s): VITAMINB12, FOLATE, FERRITIN, TIBC, IRON, RETICCTPCT in the last 72 hours. Urine analysis: No results found for: COLORURINE, APPEARANCEUR, LABSPEC, PHURINE, GLUCOSEU, HGBUR, BILIRUBINUR, KETONESUR, PROTEINUR, UROBILINOGEN, NITRITE, LEUKOCYTESUR Sepsis Labs: @LABRCNTIP (procalcitonin:4,lacticidven:4) )No results found for this or any previous visit (from the past 240 hour(s)).   Radiological Exams on Admission: Dg Chest Portable 1 View  Result Date: 06/14/2019 CLINICAL DATA:  Cough, concern for COVID EXAM: PORTABLE CHEST 1 VIEW COMPARISON:  Radiograph of the chest and ribs 01/19/2017  FINDINGS: Heart size within normal limits.  Aortic atherosclerosis. Mid to lower lung predominant bilateral interstitial prominence. Additional ill-defined left basilar opacity. No evidence of pneumothorax or sizable pleural effusion. No acute bony abnormality. IMPRESSION: Mid to lower lung predominant bilateral interstitial prominence as well as ill-defined left basilar opacity. Findings most concerning for pneumonia given provided history. Radiographic follow-up to resolution recommended. Aortic atherosclerosis. Electronically Signed   By: Kellie Simmering DO   On: 06/14/2019 14:52    EKG: Independently reviewed.  Sinus rhythm.  No acute ST changes.  Baseline wander.  Assessment/Plan: Principal Problem:   Community acquired pneumonia Active Problems:   GERD (gastroesophageal reflux disease)   Bilateral carotid artery disease (HCC)   Carotid stenosis   Essential hypertension   COPD with acute exacerbation (HCC)   Foot drop, right    This patient  was discussed with the ED physician, including pertinent vitals, physical exam findings, labs, and imaging.  We also discussed care given by the ED provider.  1. Community pneumonia Antibiotics: Rocephin and azithromycin. Robitussin Blood cultures drawn in the emergency department Sputum cultures CBC tomorrow Strep antigen by urine 2. COPD with acute exacerbation DuoNeb's every 6 scheduled with albuterol every 2 when necessary Continue inhaled steroids and LA bronchodilator Solu-Medrol 60 mg IV every 12 hours Mucinex 3. Right foot drop a. PT consult 4. GERD a. Continue home meds 5. Carotid artery disease with carotid stenosis a. Continue home meds 6. Hypertension a. Continue home meds  DVT prophylaxis: Lovenox Consultants: None Code Status: Full code Family Communication: None Disposition Plan: Patient should be able to return home following improvement of respiratory status   Truett Mainland, DO

## 2019-06-15 ENCOUNTER — Inpatient Hospital Stay (HOSPITAL_COMMUNITY): Payer: Medicare Other

## 2019-06-15 DIAGNOSIS — J189 Pneumonia, unspecified organism: Principal | ICD-10-CM

## 2019-06-15 LAB — CBC
HCT: 39.7 % (ref 39.0–52.0)
Hemoglobin: 13.2 g/dL (ref 13.0–17.0)
MCH: 31.6 pg (ref 26.0–34.0)
MCHC: 33.2 g/dL (ref 30.0–36.0)
MCV: 95 fL (ref 80.0–100.0)
Platelets: 374 10*3/uL (ref 150–400)
RBC: 4.18 MIL/uL — ABNORMAL LOW (ref 4.22–5.81)
RDW: 11.9 % (ref 11.5–15.5)
WBC: 7.8 10*3/uL (ref 4.0–10.5)
nRBC: 0 % (ref 0.0–0.2)

## 2019-06-15 LAB — BASIC METABOLIC PANEL
Anion gap: 10 (ref 5–15)
BUN: 13 mg/dL (ref 8–23)
CO2: 23 mmol/L (ref 22–32)
Calcium: 9.6 mg/dL (ref 8.9–10.3)
Chloride: 98 mmol/L (ref 98–111)
Creatinine, Ser: 0.69 mg/dL (ref 0.61–1.24)
GFR calc Af Amer: >60 mL/min (ref >60)
GFR calc non Af Amer: >60 mL/min (ref >60)
Glucose, Bld: 139 mg/dL — ABNORMAL HIGH (ref 70–99)
Potassium: 4.8 mmol/L (ref 3.5–5.1)
Sodium: 131 mmol/L — ABNORMAL LOW (ref 135–145)

## 2019-06-15 LAB — SARS CORONAVIRUS 2 (TAT 6-24 HRS): SARS Coronavirus 2: NEGATIVE

## 2019-06-15 MED ORDER — ADULT MULTIVITAMIN W/MINERALS CH
1.0000 | ORAL_TABLET | Freq: Every day | ORAL | Status: DC
  Administered 2019-06-15 – 2019-06-17 (×3): 1 via ORAL
  Filled 2019-06-15 (×3): qty 1

## 2019-06-15 MED ORDER — ENSURE ENLIVE PO LIQD
237.0000 mL | Freq: Two times a day (BID) | ORAL | Status: DC
  Administered 2019-06-15 – 2019-06-16 (×4): 237 mL via ORAL

## 2019-06-15 MED ORDER — GUAIFENESIN-DM 100-10 MG/5ML PO SYRP
10.0000 mL | ORAL_SOLUTION | ORAL | Status: DC | PRN
  Administered 2019-06-15 – 2019-06-16 (×2): 10 mL via ORAL
  Filled 2019-06-15 (×2): qty 10

## 2019-06-15 NOTE — Progress Notes (Deleted)
Patient would only take 10mg  of Lisinopril. He stated "he only takes 10mg  at home."

## 2019-06-15 NOTE — Progress Notes (Signed)
Patient seems to be improving today. He has had stable vitals, temperature, and tolerated his medications today. The cough medicine and solumedrol really helps him. I will continue to monitor him.

## 2019-06-15 NOTE — Plan of Care (Signed)

## 2019-06-15 NOTE — ED Notes (Signed)
Patient is negative for covid times 2 labs.

## 2019-06-15 NOTE — Progress Notes (Signed)
Initial Nutrition Assessment  DOCUMENTATION CODES:   Not applicable  INTERVENTION:  -Ensure Enlive po BID, each supplement provides 350 kcal and 20 grams of protein -MVI with minerals -Consider diet liberalization, pt with no history of DM  NUTRITION DIAGNOSIS:   Increased nutrient needs related to acute illness(CAP) as evidenced by estimated needs.  GOAL:   Patient will meet greater than or equal to 90% of their needs   MONITOR:   PO intake, Labs, I & O's, Supplement acceptance, Weight trends  REASON FOR ASSESSMENT:   Consult Assessment of nutrition requirement/status  ASSESSMENT:  RD working remotely.  85 year old male with past medical history of HTN, GERD, prostate disease, left internal carotid stenosis, bilateral CAD, COPD, chronic pain, depression and anxiety who presented with worsening shortness of breath, cough with coarse wheezing over the past 2 days. CXR shows developing PNA.  Patient admitted with CAP and acute COPD exacerbation.  Unable to obtain nutrition history at this time. Per chart review patient currently in CT. Patient reported that he began having weakness to right foot a couple of months ago. CT of head to ensure deficit is not related to stroke. No recorded meals available for review. Will continue to monitor for po intake of meals and provide Ensure to aid with calorie/protien needs.  Current wt 69.7 kg (153.3 lb) +1 BLE edema per review of RN assessment. Weight history reviewed; limited recent history; noted 7.7 lb wt loss over the past 8 months which is insignificant for time frame. Patient with stable 68 kg - 71.7 kg throughout 2019.  Medications reviewed and include: Methylprednisolone 60 mg every 6 hrs, Protonix 40 mg daily Zithromax Labs: Na 131 (L)  NUTRITION - FOCUSED PHYSICAL EXAM: Unable to complete at this time, RD working remotely.  Diet Order:   Diet Order            Diet heart healthy/carb modified Room service appropriate?  Yes; Fluid consistency: Thin  Diet effective now              EDUCATION NEEDS:   No education needs have been identified at this time  Skin:  Skin Assessment: Reviewed RN Assessment  Last BM:  12/3  Height:   Ht Readings from Last 1 Encounters:  06/14/19 5\' 10"  (1.778 m)    Weight:   Wt Readings from Last 1 Encounters:  06/14/19 69.7 kg    Ideal Body Weight:  75.5 kg  BMI:  Body mass index is 22.05 kg/m.  Estimated Nutritional Needs:   Kcal:  1800-2000  Protein:  90-100  Fluid:  >/= 1.7 L/day   Lajuan Lines, RD, LDN Clinical Nutrition [facility] Telephone [phone] After Hours/Weekend Pager: [phone]

## 2019-06-15 NOTE — Progress Notes (Signed)
Did not awaken patient at 0230 for inhaler as was his request.

## 2019-06-15 NOTE — Progress Notes (Signed)
PROGRESS NOTE    Peter [name]  [id number] DOB: 04-06-1933 DOA: 06/14/2019 PCP: Redmond School, MD   Brief Narrative:  Per HPI: Peter [name] is a 85 y.o. male with a history of hypertension, GERD, prostate disease, left internal carotid stenosis.  Patient seen for shortness of breath that is worse with exertion and improved with rest.  He is also had a cough with coarse wheezing.  This has been going on for 2 days and has been worsening.  Patient denies previous diagnosis of COPD, however he is a smoker with 62-pack-year history.  Additionally, he complains of right foot drop that started a week ago.  12/5: Patient was admitted with community-acquired pneumonia and started on Rocephin and azithromycin.  He was also noted to have some COPD exacerbation and is currently on scheduled breathing treatments along with IV Solu-Medrol.  He states that a couple months ago he started having some weakness to his right foot and has never had an evaluation since.  PT has been ordered.  Will check CT head as well to ensure that there has not been a stroke that could explain the deficit.  Assessment & Plan:   Principal Problem:   Community acquired pneumonia Active Problems:   GERD (gastroesophageal reflux disease)   Bilateral carotid artery disease (HCC)   Carotid stenosis   Essential hypertension   COPD with acute exacerbation (HCC)   Foot drop, right   Acute COPD exacerbation secondary to community-acquired pneumonia -Continue on Rocephin and azithromycin -Robitussin as needed for cough -Follow CBC -Check urine strep antigen and blood cultures -Solu-Medrol to prednisone -Mucinex -Schedule breathing treatments  Right-sided foot drop -Evaluate with head CT to rule out CVA -PT evaluation pending -Fall precautions  GERD -PPI  CAD with left carotid stenosis -Continue on aspirin  Hypertension-controlled -Continue amlodipine and lisinopril   DVT prophylaxis: Lovenox Code  Status: Full Family Communication: None at bedside Disposition Plan: Continue current antibiotics and breathing treatments and monitor for improvement.  PT evaluation pending.  CT head pending.   Consultants:   None  Procedures:   None  Antimicrobials:  Anti-infectives (From admission, onward)   Start     Dose/Rate Route Frequency Ordered Stop   06/15/19 1000  azithromycin (ZITHROMAX) 500 mg in sodium chloride 0.9 % 250 mL IVPB     500 mg 250 mL/hr over 60 Minutes Intravenous Every 24 hours 06/14/19 2000 06/20/19 0959   06/14/19 2200  cefTRIAXone (ROCEPHIN) 2 g in sodium chloride 0.9 % 100 mL IVPB     2 g 200 mL/hr over 30 Minutes Intravenous Every 24 hours 06/14/19 2000 06/19/19 2159   06/14/19 1715  doxycycline (VIBRA-TABS) tablet 100 mg     100 mg Oral  Once 06/14/19 1700 06/14/19 1811       Subjective: Patient seen and evaluated today with ongoing shortness of breath and some chest tightness with cough.  He states that he is feeling slightly better since he has been admitted, however.  He is currently on room air.  PT evaluation for right foot drop pending.  Objective: Vitals:   06/14/19 2032 06/14/19 2231 06/15/19 0700 06/15/19 0808  BP: (!) 159/58  (!) 146/60   Pulse: (!) 104  83   Resp: 20  16   Temp: 98.1 F (36.7 C)  97.7 F (36.5 C)   TempSrc: Oral  Oral   SpO2: 96% 94% 94% 92%  Weight: 69.7 kg     Height: 5\' 10"  (1.778 m)  Intake/Output Summary (Last 24 hours) at 06/15/2019 0943 Last data filed at 06/15/2019 0500 Gross per 24 hour  Intake 220 ml  Output -  Net 220 ml   Filed Weights   06/14/19 1409 06/14/19 2032  Weight: 73.2 kg 69.7 kg    Examination:  General exam: Appears calm and comfortable  Respiratory system: Clear to auscultation, minimal wheezing at lung bases. Respiratory effort normal.  Currently on room air. Cardiovascular system: S1 & S2 heard, RRR. No JVD, murmurs, rubs, gallops or clicks. No pedal edema. Gastrointestinal  system: Abdomen is nondistended, soft and nontender. No organomegaly or masses felt. Normal bowel sounds heard. Central nervous system: Alert and oriented. No focal neurological deficits. Extremities: Symmetric 5 x 5 power. Skin: No rashes, lesions or ulcers Psychiatry: Judgement and insight appear normal. Mood & affect appropriate.     Data Reviewed: I have personally reviewed following labs and imaging studies  CBC: Recent Labs  Lab 06/14/19 1514 06/15/19 0620  WBC 14.9* 7.8  NEUTROABS 11.5*  --   HGB 13.9 13.2  HCT 41.1 39.7  MCV 94.3 95.0  PLT 363 [id number]   Basic Metabolic Panel: Recent Labs  Lab 06/14/19 1514 06/15/19 0620  NA 129* 131*  K 4.2 4.8  CL 93* 98  CO2 26 23  GLUCOSE 129* 139*  BUN 9 13  CREATININE 0.72 0.69  CALCIUM 9.3 9.6   GFR: Estimated Creatinine Clearance: 67.8 mL/min (by C-G formula based on SCr of 0.69 mg/dL). Liver Function Tests: No results for input(s): AST, ALT, ALKPHOS, BILITOT, PROT, ALBUMIN in the last 168 hours. No results for input(s): LIPASE, AMYLASE in the last 168 hours. No results for input(s): AMMONIA in the last 168 hours. Coagulation Profile: No results for input(s): INR, PROTIME in the last 168 hours. Cardiac Enzymes: No results for input(s): CKTOTAL, CKMB, CKMBINDEX, TROPONINI in the last 168 hours. BNP (last 3 results) No results for input(s): PROBNP in the last 8760 hours. HbA1C: No results for input(s): HGBA1C in the last 72 hours. CBG: No results for input(s): GLUCAP in the last 168 hours. Lipid Profile: No results for input(s): CHOL, HDL, LDLCALC, TRIG, CHOLHDL, LDLDIRECT in the last 72 hours. Thyroid Function Tests: No results for input(s): TSH, T4TOTAL, FREET4, T3FREE, THYROIDAB in the last 72 hours. Anemia Panel: No results for input(s): VITAMINB12, FOLATE, FERRITIN, TIBC, IRON, RETICCTPCT in the last 72 hours. Sepsis Labs: No results for input(s): PROCALCITON, LATICACIDVEN in the last 168 hours.  Recent  Results (from the past 240 hour(s))  Culture, blood (routine x 2)     Status: None (Preliminary result)   Collection Time: 06/14/19  3:26 PM   Specimen: Right Antecubital; Blood  Result Value Ref Range Status   Specimen Description RIGHT ANTECUBITAL  Final   Special Requests   Final    BOTTLES DRAWN AEROBIC AND ANAEROBIC Blood Culture adequate volume   Culture   Final    NO GROWTH < 12 HOURS Performed at [facility], [address], [place], [place] [address]    Report Status PENDING  Incomplete  SARS CORONAVIRUS 2 (TAT 6-24 HRS) Nasopharyngeal Nasopharyngeal Swab     Status: None   Collection Time: 06/14/19  3:55 PM   Specimen: Nasopharyngeal Swab  Result Value Ref Range Status   SARS Coronavirus 2 NEGATIVE NEGATIVE Final    Comment: (NOTE) SARS-CoV-2 target nucleic acids are NOT DETECTED. The SARS-CoV-2 RNA is generally detectable in upper and lower respiratory specimens during the acute phase of infection. Negative results  do not preclude SARS-CoV-2 infection, do not rule out co-infections with other pathogens, and should not be used as the sole basis for treatment or other patient management decisions. Negative results must be combined with clinical observations, patient history, and epidemiological information. The expected result is Negative. Fact Sheet for Patients: SugarRoll.be Fact Sheet for Healthcare Providers: https://www.woods-mathews.com/ This test is not yet approved or cleared by the [place] FDA and  has been authorized for detection and/or diagnosis of SARS-CoV-2 by FDA under an Emergency Use Authorization (EUA). This EUA will remain  in effect (meaning this test can be used) for the duration of the COVID-19 declaration under Section 56 4(b)(1) of the Act, 21 U.S.C. section 360bbb-3(b)(1), unless the authorization is terminated or revoked sooner. Performed at [facility], [place] [address] [place], [place]  [address]   Culture, blood (routine x 2)     Status: None (Preliminary result)   Collection Time: 06/14/19  7:00 PM   Specimen: BLOOD RIGHT FOREARM  Result Value Ref Range Status   Specimen Description BLOOD RIGHT FOREARM  Final   Special Requests   Final    BOTTLES DRAWN AEROBIC ONLY Blood Culture adequate volume   Culture   Final    NO GROWTH < 12 HOURS Performed at [facility], [address] [place], [place] [address]    Report Status PENDING  Incomplete         Radiology Studies: Dg Chest Portable 1 View  Result Date: 06/14/2019 CLINICAL DATA:  Cough, concern for COVID EXAM: PORTABLE CHEST 1 VIEW COMPARISON:  Radiograph of the chest and ribs 01/19/2017 FINDINGS: Heart size within normal limits.  Aortic atherosclerosis. Mid to lower lung predominant bilateral interstitial prominence. Additional ill-defined left basilar opacity. No evidence of pneumothorax or sizable pleural effusion. No acute bony abnormality. IMPRESSION: Mid to lower lung predominant bilateral interstitial prominence as well as ill-defined left basilar opacity. Findings most concerning for pneumonia given provided history. Radiographic follow-up to resolution recommended. Aortic atherosclerosis. Electronically Signed   By: Kellie Simmering DO   On: 06/14/2019 14:52        Scheduled Meds: . ALPRAZolam  0.5 mg Oral QHS  . amLODipine  5 mg Oral Daily  . aspirin EC  81 mg Oral Daily  . enoxaparin (LOVENOX) injection  40 mg Subcutaneous Q24H  . finasteride  5 mg Oral QPC lunch  . FLUoxetine  10 mg Oral Daily  . ipratropium-albuterol  3 mL Nebulization Q6H  . lisinopril  40 mg Oral Daily  . methylPREDNISolone (SOLU-MEDROL) injection  60 mg Intravenous Q6H   Followed by  . [START ON 06/16/2019] predniSONE  40 mg Oral Q breakfast  . mometasone-formoterol  1 puff Inhalation BID  . pantoprazole  40 mg Oral QAC breakfast   Continuous Infusions: . azithromycin 500 mg (06/15/19 0851)  . cefTRIAXone (ROCEPHIN)  IV 2 g  (06/14/19 2141)     LOS: 1 day    Time spent: 30 minutes    Donae Kueker Darleen Crocker, DO [facility] Pager [phone]  If 7PM-7AM, please contact night-coverage [email or web address] Password [facility] 06/15/2019, 9:43 AM

## 2019-06-16 MED ORDER — BUDESONIDE 0.25 MG/2ML IN SUSP
0.2500 mg | Freq: Two times a day (BID) | RESPIRATORY_TRACT | Status: DC
  Administered 2019-06-16 – 2019-06-17 (×3): 0.25 mg via RESPIRATORY_TRACT
  Filled 2019-06-16 (×3): qty 2

## 2019-06-16 NOTE — Plan of Care (Signed)

## 2019-06-16 NOTE — Progress Notes (Signed)
PROGRESS NOTE    Peter [name]  [id number] DOB: April 04, 1933 DOA: 06/14/2019 PCP: Peter School, MD   Brief Narrative:  Per HPI: Peter Calero Lindseyis a 85 y.o.malewith a history of hypertension, GERD, prostate disease, left internal carotid stenosis. Patient seen for shortness of breath that is worse with exertion and improved with rest. He is also had a cough with coarse wheezing. This has been going on for 2 days and has been worsening. Patient denies previous diagnosis of COPD, however he is a smoker with 62-pack-year history.  Additionally, he complains of right foot drop that started a week ago.  12/5: Patient was admitted with community-acquired pneumonia and started on Rocephin and azithromycin.  He was also noted to have some COPD exacerbation and is currently on scheduled breathing treatments along with IV Solu-Medrol.  He states that a couple months ago he started having some weakness to his right foot and has never had an evaluation since.  PT has been ordered.  Will check CT head as well to ensure that there has not been a stroke that could explain the deficit.  12/6: Patient has continued improvements with his pneumonia and COPD exacerbation today, but he still having some wheezing noted bilaterally.  He is eager to go home soon and CT head did not demonstrate any acute stroke findings.  He is still awaiting PT evaluation for his right foot drop prior to discharge to ensure safety.  Assessment & Plan:   Principal Problem:   Community acquired pneumonia Active Problems:   GERD (gastroesophageal reflux disease)   Bilateral carotid artery disease (HCC)   Carotid stenosis   Essential hypertension   COPD with acute exacerbation (HCC)   Foot drop, right   Acute COPD exacerbation secondary to community-acquired pneumonia -Continue on Rocephin and azithromycin -Robitussin as needed for cough -Follow CBC -Check urine strep antigen and blood cultures no growth noted  thus far -Solu-Medrol to prednisone -Mucinex -Schedule breathing treatments to continue for now as he still maintain some bilateral wheezing -We will add Pulmicort today  Right-sided foot drop -CT head with no acute findings noted -PT evaluation pending and will need this prior to discharge to ensure safety. -Fall precautions  GERD -PPI  CAD with left carotid stenosis -Continue on aspirin  Hypertension-controlled -Continue amlodipine and lisinopril   DVT prophylaxis: Lovenox Code Status: Full Family Communication: None at bedside, tried calling daughter with no response Disposition Plan: Continue current antibiotics and breathing treatments and monitor for improvement.  PT evaluation pending.  CT head pending.   Consultants:   None  Procedures:   None  Antimicrobials:  Anti-infectives (From admission, onward)   Start     Dose/Rate Route Frequency Ordered Stop   06/15/19 1000  azithromycin (ZITHROMAX) 500 mg in sodium chloride 0.9 % 250 mL IVPB     500 mg 250 mL/hr over 60 Minutes Intravenous Every 24 hours 06/14/19 2000 06/20/19 0959   06/14/19 2200  cefTRIAXone (ROCEPHIN) 2 g in sodium chloride 0.9 % 100 mL IVPB     2 g 200 mL/hr over 30 Minutes Intravenous Every 24 hours 06/14/19 2000 06/19/19 2159   06/14/19 1715  doxycycline (VIBRA-TABS) tablet 100 mg     100 mg Oral  Once 06/14/19 1700 06/14/19 1811       Subjective: Patient seen and evaluated today with no new acute complaints or concerns. No acute concerns or events noted overnight.  He states that he is feeling better and overall breathing better.  PT  evaluation still pending.  Objective: Vitals:   06/15/19 2055 06/16/19 0516 06/16/19 0750 06/16/19 0803  BP: (!) 139/43 (!) 134/51    Pulse: 81 81    Resp: 20 17    Temp: 98.1 F (36.7 C) 98.3 F (36.8 C)    TempSrc: Oral Oral    SpO2: 96% 96% 97% 97%  Weight:      Height:        Intake/Output Summary (Last 24 hours) at 06/16/2019 1043  Last data filed at 06/16/2019 0844 Gross per 24 hour  Intake 470 ml  Output 500 ml  Net -30 ml   Filed Weights   06/14/19 1409 06/14/19 2032  Weight: 73.2 kg 69.7 kg    Examination:  General exam: Appears calm and comfortable  Respiratory system: Bilateral wheezing noted. Respiratory effort normal.  Currently on room air. Cardiovascular system: S1 & S2 heard, RRR. No JVD, murmurs, rubs, gallops or clicks. No pedal edema. Gastrointestinal system: Abdomen is nondistended, soft and nontender. No organomegaly or masses felt. Normal bowel sounds heard. Central nervous system: Alert and oriented. No focal neurological deficits. Extremities: Symmetric 5 x 5 power. Skin: No rashes, lesions or ulcers Psychiatry: Judgement and insight appear normal. Mood & affect appropriate.     Data Reviewed: I have personally reviewed following labs and imaging studies  CBC: Recent Labs  Lab 06/14/19 1514 06/15/19 0620  WBC 14.9* 7.8  NEUTROABS 11.5*  --   HGB 13.9 13.2  HCT 41.1 39.7  MCV 94.3 95.0  PLT 363 [id number]   Basic Metabolic Panel: Recent Labs  Lab 06/14/19 1514 06/15/19 0620  NA 129* 131*  K 4.2 4.8  CL 93* 98  CO2 26 23  GLUCOSE 129* 139*  BUN 9 13  CREATININE 0.72 0.69  CALCIUM 9.3 9.6   GFR: Estimated Creatinine Clearance: 67.8 mL/min (by C-G formula based on SCr of 0.69 mg/dL). Liver Function Tests: No results for input(s): AST, ALT, ALKPHOS, BILITOT, PROT, ALBUMIN in the last 168 hours. No results for input(s): LIPASE, AMYLASE in the last 168 hours. No results for input(s): AMMONIA in the last 168 hours. Coagulation Profile: No results for input(s): INR, PROTIME in the last 168 hours. Cardiac Enzymes: No results for input(s): CKTOTAL, CKMB, CKMBINDEX, TROPONINI in the last 168 hours. BNP (last 3 results) No results for input(s): PROBNP in the last 8760 hours. HbA1C: No results for input(s): HGBA1C in the last 72 hours. CBG: No results for input(s): GLUCAP in the  last 168 hours. Lipid Profile: No results for input(s): CHOL, HDL, LDLCALC, TRIG, CHOLHDL, LDLDIRECT in the last 72 hours. Thyroid Function Tests: No results for input(s): TSH, T4TOTAL, FREET4, T3FREE, THYROIDAB in the last 72 hours. Anemia Panel: No results for input(s): VITAMINB12, FOLATE, FERRITIN, TIBC, IRON, RETICCTPCT in the last 72 hours. Sepsis Labs: No results for input(s): PROCALCITON, LATICACIDVEN in the last 168 hours.  Recent Results (from the past 240 hour(s))  Culture, blood (routine x 2)     Status: None (Preliminary result)   Collection Time: 06/14/19  3:26 PM   Specimen: Right Antecubital; Blood  Result Value Ref Range Status   Specimen Description RIGHT ANTECUBITAL  Final   Special Requests   Final    BOTTLES DRAWN AEROBIC AND ANAEROBIC Blood Culture adequate volume   Culture   Final    NO GROWTH 2 DAYS Performed at [facility], [address] [place], [place] [address]    Report Status PENDING  Incomplete  SARS CORONAVIRUS 2 (TAT  6-24 HRS) Nasopharyngeal Nasopharyngeal Swab     Status: None   Collection Time: 06/14/19  3:55 PM   Specimen: Nasopharyngeal Swab  Result Value Ref Range Status   SARS Coronavirus 2 NEGATIVE NEGATIVE Final    Comment: (NOTE) SARS-CoV-2 target nucleic acids are NOT DETECTED. The SARS-CoV-2 RNA is generally detectable in upper and lower respiratory specimens during the acute phase of infection. Negative results do not preclude SARS-CoV-2 infection, do not rule out co-infections with other pathogens, and should not be used as the sole basis for treatment or other patient management decisions. Negative results must be combined with clinical observations, patient history, and epidemiological information. The expected result is Negative. Fact Sheet for Patients: SugarRoll.be Fact Sheet for Healthcare Providers: https://www.woods-mathews.com/ This test is not yet approved or cleared by the  [place] FDA and  has been authorized for detection and/or diagnosis of SARS-CoV-2 by FDA under an Emergency Use Authorization (EUA). This EUA will remain  in effect (meaning this test can be used) for the duration of the COVID-19 declaration under Section 56 4(b)(1) of the Act, 21 U.S.C. section 360bbb-3(b)(1), unless the authorization is terminated or revoked sooner. Performed at [facility], [place] [address], [place], [place] [address]   Culture, blood (routine x 2)     Status: None (Preliminary result)   Collection Time: 06/14/19  7:00 PM   Specimen: BLOOD RIGHT FOREARM  Result Value Ref Range Status   Specimen Description BLOOD RIGHT FOREARM  Final   Special Requests   Final    BOTTLES DRAWN AEROBIC ONLY Blood Culture adequate volume   Culture   Final    NO GROWTH 2 DAYS Performed at [facility], [address], [place], [place] [address]    Report Status PENDING  Incomplete         Radiology Studies: Ct Head Wo Contrast  Result Date: 06/15/2019 CLINICAL DATA:  Right foot weakness over the last 2 months. EXAM: CT HEAD WITHOUT CONTRAST TECHNIQUE: Contiguous axial images were obtained from the base of the skull through the vertex without intravenous contrast. COMPARISON:  08/17/2011 FINDINGS: Brain: Progressive changes of small vessel disease throughout the brain. Patchy and confluent low density is seen throughout the cerebral hemispheric white matter. Old small vessel insults evident in the basal ganglia, thalami and pons. No sign of acute infarction, mass lesion, hemorrhage, hydrocephalus or extra-axial collection. It would certainly be possible for a recent small vessel infarction to be hidden within the extensive chronic changes. Vascular: There is atherosclerotic calcification of the major vessels at the base of the brain. Skull: Negative Sinuses/Orbits: Clear/normal Other: None IMPRESSION: No acute finding by CT. Extensive chronic small-vessel ischemic changes  throughout the brain as outlined above. There could certainly be an acute or subacute small vessel infarction hidden within the extensive chronic changes. Electronically Signed   By: Nelson Chimes M.D.   On: 06/15/2019 11:40   Dg Chest Portable 1 View  Result Date: 06/14/2019 CLINICAL DATA:  Cough, concern for COVID EXAM: PORTABLE CHEST 1 VIEW COMPARISON:  Radiograph of the chest and ribs 01/19/2017 FINDINGS: Heart size within normal limits.  Aortic atherosclerosis. Mid to lower lung predominant bilateral interstitial prominence. Additional ill-defined left basilar opacity. No evidence of pneumothorax or sizable pleural effusion. No acute bony abnormality. IMPRESSION: Mid to lower lung predominant bilateral interstitial prominence as well as ill-defined left basilar opacity. Findings most concerning for pneumonia given provided history. Radiographic follow-up to resolution recommended. Aortic atherosclerosis. Electronically Signed  By: Kellie Simmering DO   On: 06/14/2019 14:52        Scheduled Meds: . ALPRAZolam  0.5 mg Oral QHS  . amLODipine  5 mg Oral Daily  . aspirin EC  81 mg Oral Daily  . budesonide (PULMICORT) nebulizer solution  0.25 mg Nebulization BID  . enoxaparin (LOVENOX) injection  40 mg Subcutaneous Q24H  . feeding supplement (ENSURE ENLIVE)  237 mL Oral BID BM  . finasteride  5 mg Oral QPC lunch  . FLUoxetine  10 mg Oral Daily  . ipratropium-albuterol  3 mL Nebulization Q6H  . lisinopril  40 mg Oral Daily  . mometasone-formoterol  1 puff Inhalation BID  . multivitamin with minerals  1 tablet Oral Daily  . pantoprazole  40 mg Oral QAC breakfast  . predniSONE  40 mg Oral Q breakfast   Continuous Infusions: . azithromycin 500 mg (06/16/19 0952)  . cefTRIAXone (ROCEPHIN)  IV Stopped (06/15/19 2130)     LOS: 2 days    Time spent: 30 minutes    Vernia Teem Darleen Crocker, DO [facility] Pager [phone]  If 7PM-7AM, please contact night-coverage [email or web address] Password  [facility] 06/16/2019, 10:43 AM

## 2019-06-17 LAB — CBC
HCT: 37.2 % — ABNORMAL LOW (ref 39.0–52.0)
Hemoglobin: 12.4 g/dL — ABNORMAL LOW (ref 13.0–17.0)
MCH: 31.7 pg (ref 26.0–34.0)
MCHC: 33.3 g/dL (ref 30.0–36.0)
MCV: 95.1 fL (ref 80.0–100.0)
Platelets: 400 10*3/uL (ref 150–400)
RBC: 3.91 MIL/uL — ABNORMAL LOW (ref 4.22–5.81)
RDW: 11.9 % (ref 11.5–15.5)
WBC: 14.8 10*3/uL — ABNORMAL HIGH (ref 4.0–10.5)
nRBC: 0 % (ref 0.0–0.2)

## 2019-06-17 LAB — BASIC METABOLIC PANEL
Anion gap: 9 (ref 5–15)
BUN: 17 mg/dL (ref 8–23)
CO2: 26 mmol/L (ref 22–32)
Calcium: 9.4 mg/dL (ref 8.9–10.3)
Chloride: 98 mmol/L (ref 98–111)
Creatinine, Ser: 0.71 mg/dL (ref 0.61–1.24)
GFR calc Af Amer: >60 mL/min (ref >60)
GFR calc non Af Amer: >60 mL/min (ref >60)
Glucose, Bld: 98 mg/dL (ref 70–99)
Potassium: 3.8 mmol/L (ref 3.5–5.1)
Sodium: 133 mmol/L — ABNORMAL LOW (ref 135–145)

## 2019-06-17 MED ORDER — CEFDINIR 300 MG PO CAPS: 300.0000 mg | ORAL_CAPSULE | Freq: Two times a day (BID) | ORAL | 0 refills | Status: AC

## 2019-06-17 MED ORDER — MOMETASONE FURO-FORMOTEROL FUM 200-5 MCG/ACT IN AERO: 1.0000 | INHALATION_SPRAY | Freq: Two times a day (BID) | RESPIRATORY_TRACT | 1 refills | Status: DC

## 2019-06-17 MED ORDER — PREDNISONE 20 MG PO TABS: 40.0000 mg | ORAL_TABLET | Freq: Every day | ORAL | 0 refills | Status: AC

## 2019-06-17 MED ORDER — AZITHROMYCIN 500 MG PO TABS: 500.0000 mg | ORAL_TABLET | Freq: Every day | ORAL | 0 refills | Status: AC

## 2019-06-17 MED ORDER — ALBUTEROL SULFATE HFA 108 (90 BASE) MCG/ACT IN AERS: 1.0000 | INHALATION_SPRAY | Freq: Four times a day (QID) | RESPIRATORY_TRACT | 0 refills | Status: DC | PRN

## 2019-06-17 NOTE — TOC Transition Note (Addendum)
Transition of Care [facility]) - CM/SW Discharge Note   Patient Details  Name: Peter [name] MRN: [id number] Date of Birth: 1933-06-23  [facility] CM/SW Contact:  Kaiana Marion, Chauncey Reading, RN Phone Number: 06/17/2019, 11:15 AM   Clinical Narrative:  Admit with COPD. Discharging home today. Has 24/7 caregivers. Seen by PT, recommended for home health PT, patient declines. Reports caregivers can help him. Has PCP. Daughter drives him to appointments. No issues affording medications. Aware PCP can order home health at a later time.    ADDENDUM: Daughter wants patient to have home health PT, has been trying to arrange through PCP. Daughter reports they have been referring to [facility] with no success. Will refer to [facility]   [facility] accepted referral. Daughter, [name], updated. Start of service will be Friday.     Final next level of care: Home/Self Care Barriers to Discharge: Barriers Resolved   Patient Goals and CMS Choice   CMS Medicare.gov Compare Post Acute Care list provided to:: Patient Choice offered to / list presented to : Patient   Discharge Plan and Services   Discharge Planning Services: CM Consult Post Acute Care Choice: Home Health                    HH Arranged: Patient Refused [facility]        Readmission Risk Interventions No flowsheet data found.     Expected Discharge Plan: Home/Self Care Barriers to Discharge: Barriers Resolved  Expected Discharge Plan and Services Expected Discharge Plan: Home/Self Care   Discharge Planning Services: CM Consult Post Acute Care Choice: Home Health                             HH Arranged: Patient Refused HH          Do you feel safe going back to the place where you live?: Yes               Activities of Daily Living Home Assistive Devices/Equipment: Gilford Rile (specify type), Grab bars in shower, Dentures (specify type)(upper dentures) ADL Screening (condition at time of admission) Patient's  cognitive ability adequate to safely complete daily activities?: Yes Is the patient deaf or have difficulty hearing?: No Does the patient have difficulty seeing, even when wearing glasses/contacts?: No Does the patient have difficulty concentrating, remembering, or making decisions?: No Patient able to express need for assistance with ADLs?: Yes Does the patient have difficulty dressing or bathing?: Yes Independently performs ADLs?: No Communication: Independent Dressing (OT): Needs assistance Is this a change from baseline?: Pre-admission baseline Grooming: Needs assistance Is this a change from baseline?: Pre-admission baseline Feeding: Independent Bathing: Needs assistance Is this a change from baseline?: Pre-admission baseline Toileting: Needs assistance Is this a change from baseline?: Pre-admission baseline In/Out Bed: Needs assistance Is this a change from baseline?: Pre-admission baseline Does the patient have difficulty walking or climbing stairs?: Yes Weakness of Legs: Both Weakness of Arms/Hands: None  Emotional Assessment   Attitude/Demeanor/Rapport: Engaged Affect (typically observed): Accepting, Appropriate Orientation: : Oriented to Self, Oriented to Place, Oriented to  Time      Admission diagnosis:  COPD exacerbation ([place]) [J44.1] Community acquired pneumonia, unspecified laterality [J18.9] Patient Active Problem List   Diagnosis Date Noted  . Community acquired pneumonia 06/14/2019  . COPD with acute exacerbation ([place]) 06/14/2019  . Foot drop, right 06/14/2019  . PAD (peripheral artery disease) ([place]) 11/28/2017  . Hyperlipidemia 11/28/2017  .  Essential hypertension 11/28/2017  . Atherosclerosis of native arteries of extremity with rest pain ([place]) 10/18/2017  . Carotid stenosis 03/20/2017  . Bilateral carotid artery disease ([place]) 03/17/2017  . Leukocytosis 01/18/2017  . Irregular heart beat 04/03/2013  . Constipation 05/16/2011  . Esophageal dysphagia  11/03/2010  . GERD (gastroesophageal reflux disease) 11/03/2010  . Schatzki's ring 11/03/2010   PCP:  Redmond School, MD Pharmacy:

## 2019-06-17 NOTE — Discharge Summary (Addendum)
Physician Discharge Summary  HENDRICKS CANTY [id number] DOB: [birth year] DOA: 06/14/2019  PCP: Redmond School, MD  Admit date: 06/14/2019  Discharge date: 06/17/2019  Admitted From:Home  Disposition:  Home  Recommendations for Outpatient Follow-up:  1. Follow up with PCP in 1-2 weeks 2. Continue on azithromycin and Omnicef as prescribed to complete course of treatment for pneumonia 3. Continue on prednisone for 3 more days as prescribed 4. Use albuterol inhaler as prescribed for any shortness of breath or wheezing  Home Health: Yes with PT  Equipment/Devices: None  Discharge Condition: Stable  CODE STATUS: Full  Diet recommendation: Heart Healthy  Brief/Interim Summary: Per HPI: Ryananthony Durbano Lindseyis an 85 y.o.malewith a history of hypertension, GERD, prostate disease, left internal carotid stenosis. Patient seen for shortness of breath that is worse with exertion and improved with rest. He is also had a cough with coarse wheezing. This has been going on for 2 days and has been worsening. Patient denies previous diagnosis of COPD, however he is a smoker with 62-pack-year history.  Additionally, he complains of right foot drop that started a week ago.  12/5:Patient was admitted with community-acquired pneumonia and started on Rocephin and azithromycin. He was also noted to have some COPD exacerbation and is currently on scheduled breathing treatments along with IV Solu-Medrol. He states that a couple months ago he started having some weakness to his right foot and has never had an evaluation since. PT has been ordered. Will check CT head as well to ensure that there has not been a stroke that could explain the deficit.  12/6: Patient has continued improvements with his pneumonia and COPD exacerbation today, but he still having some wheezing noted bilaterally.  He is eager to go home soon and CT head did not demonstrate any acute stroke findings.  He is still awaiting PT  evaluation for his right foot drop prior to discharge to ensure safety.  12/7: Patient is stable for discharge today and has been evaluated by physical therapy with noted need for home physical therapy, however patient declines at this time.  He will remain on antibiotics as well as steroids and inhaler for any shortness of breath or wheezing as needed.  No other acute events noted throughout the course of this admission.  Discharge Diagnoses:  Principal Problem:   Community acquired pneumonia Active Problems:   GERD (gastroesophageal reflux disease)   Bilateral carotid artery disease (HCC)   Carotid stenosis   Essential hypertension   COPD with acute exacerbation ([place])   Foot drop, right  Principal discharge diagnosis: Acute COPD exacerbation secondary to community-acquired pneumonia.  Discharge Instructions  Discharge Instructions    Diet - low sodium heart healthy   Complete by: As directed    Increase activity slowly   Complete by: As directed      Allergies as of 06/17/2019      Reactions   Penicillins Rash   Has patient had a PCN reaction causing immediate rash, facial/tongue/throat swelling, SOB or lightheadedness with hypotension: Yes Has patient had a PCN reaction causing severe rash involving mucus membranes or skin necrosis: No Has patient had a PCN reaction that required hospitalization: No Has patient had a PCN reaction occurring within the last 10 years: No If all of the above answers are "NO", then may proceed with Cephalosporin use.      Medication List    TAKE these medications   albuterol 108 (90 Base) MCG/ACT inhaler Commonly known as: VENTOLIN HFA Inhale 1-2 puffs  into the lungs every 6 (six) hours as needed for wheezing or shortness of breath.   ALPRAZolam 0.5 MG tablet Commonly known as: XANAX Take 0.5 mg by mouth at bedtime.   amLODipine 5 MG tablet Commonly known as: NORVASC Take 5 mg by mouth daily.   aspirin EC 81 MG tablet Take 81 mg by  mouth daily.   azithromycin 500 MG tablet Commonly known as: Zithromax Take 1 tablet (500 mg total) by mouth daily for 1 day. Take 1 tablet daily for 3 days.   cefdinir 300 MG capsule Commonly known as: OMNICEF Take 1 capsule (300 mg total) by mouth 2 (two) times daily for 3 days.   finasteride 5 MG tablet Commonly known as: PROSCAR Take 5 mg by mouth daily after lunch.   FLUoxetine 10 MG capsule Commonly known as: PROZAC Take 10 mg by mouth daily.   lactulose 10 GM/15ML solution Commonly known as: CHRONULAC TAKE TWO TABLESPOONFULS AT BEDTIME AS NEEDED. What changed: See the new instructions.   lisinopril 40 MG tablet Commonly known as: ZESTRIL Take 40 mg by mouth daily.   mometasone-formoterol 200-5 MCG/ACT Aero Commonly known as: DULERA Inhale 1 puff into the lungs 2 (two) times daily.   pantoprazole 40 MG tablet Commonly known as: PROTONIX Take 1 tablet (40 mg total) by mouth daily before breakfast.   predniSONE 20 MG tablet Commonly known as: DELTASONE Take 2 tablets (40 mg total) by mouth daily with breakfast for 3 days. Start taking on: June 18, 2019      Follow-up Information    Redmond School, MD Follow up in 1 week(s).   Specialty: Internal Medicine Contact information: [address] [place] [id number] [phone]          Allergies  Allergen Reactions  . Penicillins Rash    Has patient had a PCN reaction causing immediate rash, facial/tongue/throat swelling, SOB or lightheadedness with hypotension: Yes Has patient had a PCN reaction causing severe rash involving mucus membranes or skin necrosis: No Has patient had a PCN reaction that required hospitalization: No Has patient had a PCN reaction occurring within the last 10 years: No If all of the above answers are "NO", then may proceed with Cephalosporin use.     Consultations:  None   Procedures/Studies: Ct Head Wo Contrast  Result Date: 06/15/2019 CLINICAL DATA:  Right  foot weakness over the last 2 months. EXAM: CT HEAD WITHOUT CONTRAST TECHNIQUE: Contiguous axial images were obtained from the base of the skull through the vertex without intravenous contrast. COMPARISON:  08/17/2011 FINDINGS: Brain: Progressive changes of small vessel disease throughout the brain. Patchy and confluent low density is seen throughout the cerebral hemispheric white matter. Old small vessel insults evident in the basal ganglia, thalami and pons. No sign of acute infarction, mass lesion, hemorrhage, hydrocephalus or extra-axial collection. It would certainly be possible for a recent small vessel infarction to be hidden within the extensive chronic changes. Vascular: There is atherosclerotic calcification of the major vessels at the base of the brain. Skull: Negative Sinuses/Orbits: Clear/normal Other: None IMPRESSION: No acute finding by CT. Extensive chronic small-vessel ischemic changes throughout the brain as outlined above. There could certainly be an acute or subacute small vessel infarction hidden within the extensive chronic changes. Electronically Signed   By: Nelson Chimes M.D.   On: 06/15/2019 11:40   Dg Chest Portable 1 View  Result Date: 06/14/2019 CLINICAL DATA:  Cough, concern for COVID EXAM: PORTABLE CHEST 1 VIEW COMPARISON:  Radiograph  of the chest and ribs 01/19/2017 FINDINGS: Heart size within normal limits.  Aortic atherosclerosis. Mid to lower lung predominant bilateral interstitial prominence. Additional ill-defined left basilar opacity. No evidence of pneumothorax or sizable pleural effusion. No acute bony abnormality. IMPRESSION: Mid to lower lung predominant bilateral interstitial prominence as well as ill-defined left basilar opacity. Findings most concerning for pneumonia given provided history. Radiographic follow-up to resolution recommended. Aortic atherosclerosis. Electronically Signed   By: Kellie Simmering DO   On: 06/14/2019 14:52     Discharge Exam: Vitals:    06/17/19 0751 06/17/19 1000  BP:  130/66  Pulse:  89  Resp:    Temp:    SpO2: 96% 95%   Vitals:   06/16/19 2122 06/17/19 0521 06/17/19 0751 06/17/19 1000  BP: (!) 139/53 (!) 169/62  130/66  Pulse: 84 87  89  Resp:      Temp: 98.1 F (36.7 C) 97.7 F (36.5 C)    TempSrc: Oral Oral    SpO2: 95% 97% 96% 95%  Weight:      Height:        General: Pt is alert, awake, not in acute distress Cardiovascular: RRR, S1/S2 +, no rubs, no gallops Respiratory: CTA bilaterally, no wheezing, no rhonchi Abdominal: Soft, NT, ND, bowel sounds + Extremities: no edema, no cyanosis    The results of significant diagnostics from this hospitalization (including imaging, microbiology, ancillary and laboratory) are listed below for reference.     Microbiology: Recent Results (from the past 240 hour(s))  Culture, blood (routine x 2)     Status: None (Preliminary result)   Collection Time: 06/14/19  3:26 PM   Specimen: Right Antecubital; Blood  Result Value Ref Range Status   Specimen Description RIGHT ANTECUBITAL  Final   Special Requests   Final    BOTTLES DRAWN AEROBIC AND ANAEROBIC Blood Culture adequate volume   Culture   Final    NO GROWTH 3 DAYS Performed at [facility], [address], [place], [place] [address]    Report Status PENDING  Incomplete  SARS CORONAVIRUS 2 (TAT 6-24 HRS) Nasopharyngeal Nasopharyngeal Swab     Status: None   Collection Time: 06/14/19  3:55 PM   Specimen: Nasopharyngeal Swab  Result Value Ref Range Status   SARS Coronavirus 2 NEGATIVE NEGATIVE Final    Comment: (NOTE) SARS-CoV-2 target nucleic acids are NOT DETECTED. The SARS-CoV-2 RNA is generally detectable in upper and lower respiratory specimens during the acute phase of infection. Negative results do not preclude SARS-CoV-2 infection, do not rule out co-infections with other pathogens, and should not be used as the sole basis for treatment or other patient management decisions. Negative results must  be combined with clinical observations, patient history, and epidemiological information. The expected result is Negative. Fact Sheet for Patients: SugarRoll.be Fact Sheet for Healthcare Providers: https://www.woods-mathews.com/ This test is not yet approved or cleared by the [place] FDA and  has been authorized for detection and/or diagnosis of SARS-CoV-2 by FDA under an Emergency Use Authorization (EUA). This EUA will remain  in effect (meaning this test can be used) for the duration of the COVID-19 declaration under Section 56 4(b)(1) of the Act, 21 U.S.C. section 360bbb-3(b)(1), unless the authorization is terminated or revoked sooner. Performed at [facility], [place] [address], [place], [place] [address]   Culture, blood (routine x 2)     Status: None (Preliminary result)   Collection Time: 06/14/19  7:00 PM   Specimen: BLOOD RIGHT  FOREARM  Result Value Ref Range Status   Specimen Description BLOOD RIGHT FOREARM  Final   Special Requests   Final    BOTTLES DRAWN AEROBIC ONLY Blood Culture adequate volume   Culture   Final    NO GROWTH 3 DAYS Performed at [facility], [address], [place], [place] [address]    Report Status PENDING  Incomplete     Labs: BNP (last 3 results) No results for input(s): BNP in the last 8760 hours. Basic Metabolic Panel: Recent Labs  Lab 06/14/19 1514 06/15/19 0620 06/17/19 0511  NA 129* 131* 133*  K 4.2 4.8 3.8  CL 93* 98 98  CO2 26 23 26   GLUCOSE 129* 139* 98  BUN 9 13 17   CREATININE 0.72 0.69 0.71  CALCIUM 9.3 9.6 9.4   Liver Function Tests: No results for input(s): AST, ALT, ALKPHOS, BILITOT, PROT, ALBUMIN in the last 168 hours. No results for input(s): LIPASE, AMYLASE in the last 168 hours. No results for input(s): AMMONIA in the last 168 hours. CBC: Recent Labs  Lab 06/14/19 1514 06/15/19 0620 06/17/19 0511  WBC 14.9* 7.8 14.8*  NEUTROABS 11.5*  --   --   HGB 13.9  13.2 12.4*  HCT 41.1 39.7 37.2*  MCV 94.3 95.0 95.1  PLT 363 374 400   Cardiac Enzymes: No results for input(s): CKTOTAL, CKMB, CKMBINDEX, TROPONINI in the last 168 hours. BNP: Invalid input(s): POCBNP CBG: No results for input(s): GLUCAP in the last 168 hours. D-Dimer No results for input(s): DDIMER in the last 72 hours. Hgb A1c No results for input(s): HGBA1C in the last 72 hours. Lipid Profile No results for input(s): CHOL, HDL, LDLCALC, TRIG, CHOLHDL, LDLDIRECT in the last 72 hours. Thyroid function studies No results for input(s): TSH, T4TOTAL, T3FREE, THYROIDAB in the last 72 hours.  Invalid input(s): FREET3 Anemia work up No results for input(s): VITAMINB12, FOLATE, FERRITIN, TIBC, IRON, RETICCTPCT in the last 72 hours. Urinalysis No results found for: COLORURINE, APPEARANCEUR, [place], [place], [place], [place], [place], [place], PROTEINUR, UROBILINOGEN, NITRITE, LEUKOCYTESUR Sepsis Labs Invalid input(s): PROCALCITONIN,  WBC,  LACTICIDVEN Microbiology Recent Results (from the past 240 hour(s))  Culture, blood (routine x 2)     Status: None (Preliminary result)   Collection Time: 06/14/19  3:26 PM   Specimen: Right Antecubital; Blood  Result Value Ref Range Status   Specimen Description RIGHT ANTECUBITAL  Final   Special Requests   Final    BOTTLES DRAWN AEROBIC AND ANAEROBIC Blood Culture adequate volume   Culture   Final    NO GROWTH 3 DAYS Performed at [facility], [address] [place], [place] [address]    Report Status PENDING  Incomplete  SARS CORONAVIRUS 2 (TAT 6-24 HRS) Nasopharyngeal Nasopharyngeal Swab     Status: None   Collection Time: 06/14/19  3:55 PM   Specimen: Nasopharyngeal Swab  Result Value Ref Range Status   SARS Coronavirus 2 NEGATIVE NEGATIVE Final    Comment: (NOTE) SARS-CoV-2 target nucleic acids are NOT DETECTED. The SARS-CoV-2 RNA is generally detectable in upper and lower respiratory specimens during the acute phase of  infection. Negative results do not preclude SARS-CoV-2 infection, do not rule out co-infections with other pathogens, and should not be used as the sole basis for treatment or other patient management decisions. Negative results must be combined with clinical observations, patient history, and epidemiological information. The expected result is Negative. Fact Sheet for Patients: SugarRoll.be Fact Sheet for Healthcare Providers: https://www.woods-mathews.com/ This test is not  yet approved or cleared by the [place] and  has been authorized for detection and/or diagnosis of SARS-CoV-2 by FDA under an Emergency Use Authorization (EUA). This EUA will remain  in effect (meaning this test can be used) for the duration of the COVID-19 declaration under Section 56 4(b)(1) of the Act, 21 U.S.C. section 360bbb-3(b)(1), unless the authorization is terminated or revoked sooner. Performed at [facility], [place] [address] [place], [place] [address]   Culture, blood (routine x 2)     Status: None (Preliminary result)   Collection Time: 06/14/19  7:00 PM   Specimen: BLOOD RIGHT FOREARM  Result Value Ref Range Status   Specimen Description BLOOD RIGHT FOREARM  Final   Special Requests   Final    BOTTLES DRAWN AEROBIC ONLY Blood Culture adequate volume   Culture   Final    NO GROWTH 3 DAYS Performed at [facility], [address], [place],  [address]    Report Status PENDING  Incomplete     Time coordinating discharge: 35 minutes  SIGNED:   Rodena Goldmann, DO [facility] 06/17/2019, 11:25 AM  If 7PM-7AM, please contact night-coverage [email or web address]

## 2019-06-17 NOTE — Plan of Care (Signed)
  Problem: Acute Rehab PT Goals(only PT should resolve) Goal: Pt Will Ambulate Outcome: Progressing Flowsheets (Taken 06/17/2019 1102) Pt will Ambulate:  50 feet  with supervision  with rolling walker Goal: Pt/caregiver will Perform Home Exercise Program Outcome: Progressing Flowsheets (Taken 06/17/2019 1102) Pt/caregiver will Perform Home Exercise Program:  For increased ROM  For increased strengthening  For improved balance  With Supervision, verbal cues required/provided   Talbot Grumbling PT, DPT 06/17/19, 11:03 AM [phone]

## 2019-06-17 NOTE — Care Management Important Message (Signed)
Important Message  Patient Details  Name: Peter [name] MRN: [id number] Date of Birth: [birth year]   Medicare Important Message Given:  Yes     [name] 06/17/2019, 1:19 PM

## 2019-06-17 NOTE — Progress Notes (Signed)
Discharge instructions reviewed with pt and daughter, Peter [name] by phone. Both verbalized understanding of f/u appointments and of new medications. Verbalized understanding of new meds called into pharmacy. Pt stable. Left unit via w/c

## 2019-06-17 NOTE — Evaluation (Signed)
Physical Therapy Evaluation Patient Details Name: Peter [name] MRN: [id number] DOB: 06/01/1933 Today's Date: 06/17/2019   History of Present Illness  Peter [name] is a 85 y.o. male with a history of hypertension, GERD, prostate disease, left internal carotid stenosis.  Patient seen for shortness of breath that is worse with exertion and improved with rest.  He is also had a cough with coarse wheezing.  This has been going on for 2 days and has been worsening.  Patient denies previous diagnosis of COPD, however he is a smoker with 62-pack-year history.  Clinical Impression  Pt admitted with above diagnosis. Pt with caregivers 24/7 and daughter present to continue assisting pt safely as needed. Pt with R foot drop, compensating with increased R hip flexion during gait, but with increased unsteadiness with turning. Pt would benefit from R AFO to decrease risk for falls with ambulation due to R ankle weakness. Pt currently with functional limitations due to the deficits listed below (see PT Problem List). Pt will benefit from skilled PT to increase their independence and safety with mobility to allow discharge to the venue listed below.       Follow Up Recommendations Home health PT    Equipment Recommendations  None recommended by PT    Recommendations for Other Services Other (comment)(R AFO)     Precautions / Restrictions Precautions Precautions: Fall Precaution Comments: R foot drop Restrictions Weight Bearing Restrictions: No      Mobility  Bed Mobility Overal bed mobility: Modified Independent             General bed mobility comments: increased time, use of bedrail to upright trunk  Transfers Overall transfer level: Modified independent Equipment used: Rolling walker (2 wheeled)             General transfer comment: rocking momentum to rise from low surfaces  Ambulation/Gait Ambulation/Gait assistance: Min guard Gait Distance (Feet): 20 Feet Assistive  device: Rolling walker (2 wheeled) Gait Pattern/deviations: Decreased dorsiflexion - right Gait velocity: decreased   General Gait Details: R foot drop evident, pt compensates with increased R hip flexion with gait, unsteady with turns due to foot drop, no loss of balance or near falls  Stairs            Wheelchair Mobility    Modified Rankin (Stroke Patients Only)       Balance Overall balance assessment: Needs assistance   Sitting balance-Leahy Scale: Good Sitting balance - Comments: seated EOB   Standing balance support: During functional activity;Bilateral upper extremity supported Standing balance-Leahy Scale: Fair Standing balance comment: with RW                             Pertinent Vitals/Pain Pain Assessment: No/denies pain    Home Living Family/patient expects to be discharged to:: Private residence Living Arrangements: Alone Available Help at Discharge: Personal care attendant;Available 24 hours/day Type of Home: House Home Access: Level entry     Home Layout: One level Home Equipment: Walker - 2 wheels;Cane - single point;Grab bars - toilet;Grab bars - tub/shower      Prior Function Level of Independence: Needs assistance   Gait / Transfers Assistance Needed: Pt reports hired caregivers follow behind him when ambulating with RW.  ADL's / Homemaking Assistance Needed: Pt reports hired caregivers complete cooking, cleaning, assist with bathing and dressing.  Comments: Pt reports RLE with increasing weakness ~2 months ago requiring him to use RW for  all community and household ambulation, previously using SPC or no AD with short distances     Hand Dominance   Dominant Hand: Right    Extremity/Trunk Assessment   Upper Extremity Assessment Upper Extremity Assessment: Defer to OT evaluation    Lower Extremity Assessment Lower Extremity Assessment: RLE deficits/detail RLE Deficits / Details: ankle dorsiflexion 1/5, ankle  plantarflexion 1/5, hip and knee 5/5 grossly assessed    Cervical / Trunk Assessment Cervical / Trunk Assessment: Normal  Communication   Communication: No difficulties  Cognition Arousal/Alertness: Awake/alert Behavior During Therapy: WFL for tasks assessed/performed Overall Cognitive Status: Within Functional Limits for tasks assessed                                        General Comments      Exercises     Assessment/Plan    PT Assessment Patient needs continued PT services  PT Problem List Decreased strength;Decreased range of motion;Decreased activity tolerance;Decreased balance       PT Treatment Interventions DME instruction;Gait training;Functional mobility training;Therapeutic activities;Balance training;Neuromuscular re-education;Patient/family education;Manual techniques    PT Goals (Current goals can be found in the Care Plan section)  Acute Rehab PT Goals Patient Stated Goal: home with caregivers PT Goal Formulation: With patient Time For Goal Achievement: 06/24/19 Potential to Achieve Goals: Good    Frequency Min 3X/week   Barriers to discharge        Co-evaluation               AM-PAC PT "6 Clicks" Mobility  Outcome Measure Help needed turning from your back to your side while in a flat bed without using bedrails?: None Help needed moving from lying on your back to sitting on the side of a flat bed without using bedrails?: None Help needed moving to and from a bed to a chair (including a wheelchair)?: A Little Help needed standing up from a chair using your arms (e.g., wheelchair or bedside chair)?: None Help needed to walk in hospital room?: A Little Help needed climbing 3-5 steps with a railing? : A Little 6 Click Score: 21    End of Session Equipment Utilized During Treatment: Gait belt Activity Tolerance: Patient tolerated treatment well Patient left: in chair;with call bell/phone within reach;with nursing/sitter in  room Nurse Communication: Mobility status PT Visit Diagnosis: Unsteadiness on feet (R26.81);Other abnormalities of gait and mobility (R26.89);Muscle weakness (generalized) (M62.81)    Time: 1030-1051 PT Time Calculation (min) (ACUTE ONLY): 21 min   Charges:   PT Evaluation $PT Eval Moderate Complexity: 1 Mod PT Treatments $Gait Training: 8-22 mins         Tori Esteen Delpriore PT, DPT 06/17/19, 11:02 AM [phone]

## 2019-06-18 ENCOUNTER — Ambulatory Visit: Payer: Medicare Other | Admitting: Gastroenterology

## 2019-06-18 DIAGNOSIS — J441 Chronic obstructive pulmonary disease with (acute) exacerbation: Secondary | ICD-10-CM | POA: Diagnosis not present

## 2019-06-18 DIAGNOSIS — J440 Chronic obstructive pulmonary disease with acute lower respiratory infection: Secondary | ICD-10-CM | POA: Diagnosis not present

## 2019-06-18 DIAGNOSIS — I10 Essential (primary) hypertension: Secondary | ICD-10-CM | POA: Diagnosis not present

## 2019-06-18 DIAGNOSIS — M1990 Unspecified osteoarthritis, unspecified site: Secondary | ICD-10-CM | POA: Diagnosis not present

## 2019-06-18 DIAGNOSIS — I6523 Occlusion and stenosis of bilateral carotid arteries: Secondary | ICD-10-CM | POA: Diagnosis not present

## 2019-06-18 DIAGNOSIS — M21371 Foot drop, right foot: Secondary | ICD-10-CM | POA: Diagnosis not present

## 2019-06-18 DIAGNOSIS — J188 Other pneumonia, unspecified organism: Secondary | ICD-10-CM | POA: Diagnosis not present

## 2019-06-18 DIAGNOSIS — K219 Gastro-esophageal reflux disease without esophagitis: Secondary | ICD-10-CM | POA: Diagnosis not present

## 2019-06-18 DIAGNOSIS — G8929 Other chronic pain: Secondary | ICD-10-CM | POA: Diagnosis not present

## 2019-06-18 DIAGNOSIS — M5430 Sciatica, unspecified side: Secondary | ICD-10-CM | POA: Diagnosis not present

## 2019-06-18 DIAGNOSIS — K449 Diaphragmatic hernia without obstruction or gangrene: Secondary | ICD-10-CM | POA: Diagnosis not present

## 2019-06-19 LAB — CULTURE, BLOOD (ROUTINE X 2)
Culture: NO GROWTH
Culture: NO GROWTH
Special Requests: ADEQUATE
Special Requests: ADEQUATE

## 2019-06-21 DIAGNOSIS — M5430 Sciatica, unspecified side: Secondary | ICD-10-CM | POA: Diagnosis not present

## 2019-06-21 DIAGNOSIS — M1991 Primary osteoarthritis, unspecified site: Secondary | ICD-10-CM | POA: Diagnosis not present

## 2019-06-21 DIAGNOSIS — J189 Pneumonia, unspecified organism: Secondary | ICD-10-CM | POA: Diagnosis not present

## 2019-06-21 DIAGNOSIS — K219 Gastro-esophageal reflux disease without esophagitis: Secondary | ICD-10-CM | POA: Diagnosis not present

## 2019-06-21 DIAGNOSIS — I6523 Occlusion and stenosis of bilateral carotid arteries: Secondary | ICD-10-CM | POA: Diagnosis not present

## 2019-06-21 DIAGNOSIS — J449 Chronic obstructive pulmonary disease, unspecified: Secondary | ICD-10-CM | POA: Diagnosis not present

## 2019-06-21 DIAGNOSIS — I10 Essential (primary) hypertension: Secondary | ICD-10-CM | POA: Diagnosis not present

## 2019-06-21 DIAGNOSIS — I779 Disorder of arteries and arterioles, unspecified: Secondary | ICD-10-CM | POA: Diagnosis not present

## 2019-06-21 DIAGNOSIS — J188 Other pneumonia, unspecified organism: Secondary | ICD-10-CM | POA: Diagnosis not present

## 2019-06-21 DIAGNOSIS — M1990 Unspecified osteoarthritis, unspecified site: Secondary | ICD-10-CM | POA: Diagnosis not present

## 2019-06-21 DIAGNOSIS — G8929 Other chronic pain: Secondary | ICD-10-CM | POA: Diagnosis not present

## 2019-06-21 DIAGNOSIS — M21371 Foot drop, right foot: Secondary | ICD-10-CM | POA: Diagnosis not present

## 2019-06-21 DIAGNOSIS — J440 Chronic obstructive pulmonary disease with acute lower respiratory infection: Secondary | ICD-10-CM | POA: Diagnosis not present

## 2019-06-21 DIAGNOSIS — J441 Chronic obstructive pulmonary disease with (acute) exacerbation: Secondary | ICD-10-CM | POA: Diagnosis not present

## 2019-06-21 DIAGNOSIS — Z6822 Body mass index (BMI) 22.0-22.9, adult: Secondary | ICD-10-CM | POA: Diagnosis not present

## 2019-06-21 DIAGNOSIS — K449 Diaphragmatic hernia without obstruction or gangrene: Secondary | ICD-10-CM | POA: Diagnosis not present

## 2019-06-24 DIAGNOSIS — M1990 Unspecified osteoarthritis, unspecified site: Secondary | ICD-10-CM | POA: Diagnosis not present

## 2019-06-24 DIAGNOSIS — J188 Other pneumonia, unspecified organism: Secondary | ICD-10-CM | POA: Diagnosis not present

## 2019-06-24 DIAGNOSIS — M5430 Sciatica, unspecified side: Secondary | ICD-10-CM | POA: Diagnosis not present

## 2019-06-24 DIAGNOSIS — M21371 Foot drop, right foot: Secondary | ICD-10-CM | POA: Diagnosis not present

## 2019-06-24 DIAGNOSIS — K219 Gastro-esophageal reflux disease without esophagitis: Secondary | ICD-10-CM | POA: Diagnosis not present

## 2019-06-24 DIAGNOSIS — J441 Chronic obstructive pulmonary disease with (acute) exacerbation: Secondary | ICD-10-CM | POA: Diagnosis not present

## 2019-06-24 DIAGNOSIS — I6523 Occlusion and stenosis of bilateral carotid arteries: Secondary | ICD-10-CM | POA: Diagnosis not present

## 2019-06-24 DIAGNOSIS — K449 Diaphragmatic hernia without obstruction or gangrene: Secondary | ICD-10-CM | POA: Diagnosis not present

## 2019-06-24 DIAGNOSIS — G8929 Other chronic pain: Secondary | ICD-10-CM | POA: Diagnosis not present

## 2019-06-24 DIAGNOSIS — J440 Chronic obstructive pulmonary disease with acute lower respiratory infection: Secondary | ICD-10-CM | POA: Diagnosis not present

## 2019-06-24 DIAGNOSIS — I10 Essential (primary) hypertension: Secondary | ICD-10-CM | POA: Diagnosis not present

## 2019-06-27 DIAGNOSIS — G8929 Other chronic pain: Secondary | ICD-10-CM | POA: Diagnosis not present

## 2019-06-27 DIAGNOSIS — M1990 Unspecified osteoarthritis, unspecified site: Secondary | ICD-10-CM | POA: Diagnosis not present

## 2019-06-27 DIAGNOSIS — J440 Chronic obstructive pulmonary disease with acute lower respiratory infection: Secondary | ICD-10-CM | POA: Diagnosis not present

## 2019-06-27 DIAGNOSIS — J441 Chronic obstructive pulmonary disease with (acute) exacerbation: Secondary | ICD-10-CM | POA: Diagnosis not present

## 2019-06-27 DIAGNOSIS — J188 Other pneumonia, unspecified organism: Secondary | ICD-10-CM | POA: Diagnosis not present

## 2019-06-27 DIAGNOSIS — K449 Diaphragmatic hernia without obstruction or gangrene: Secondary | ICD-10-CM | POA: Diagnosis not present

## 2019-06-27 DIAGNOSIS — M5430 Sciatica, unspecified side: Secondary | ICD-10-CM | POA: Diagnosis not present

## 2019-06-27 DIAGNOSIS — M21371 Foot drop, right foot: Secondary | ICD-10-CM | POA: Diagnosis not present

## 2019-06-27 DIAGNOSIS — K219 Gastro-esophageal reflux disease without esophagitis: Secondary | ICD-10-CM | POA: Diagnosis not present

## 2019-06-27 DIAGNOSIS — I6523 Occlusion and stenosis of bilateral carotid arteries: Secondary | ICD-10-CM | POA: Diagnosis not present

## 2019-06-27 DIAGNOSIS — I10 Essential (primary) hypertension: Secondary | ICD-10-CM | POA: Diagnosis not present

## 2019-07-02 DIAGNOSIS — K449 Diaphragmatic hernia without obstruction or gangrene: Secondary | ICD-10-CM | POA: Diagnosis not present

## 2019-07-02 DIAGNOSIS — J188 Other pneumonia, unspecified organism: Secondary | ICD-10-CM | POA: Diagnosis not present

## 2019-07-02 DIAGNOSIS — M1990 Unspecified osteoarthritis, unspecified site: Secondary | ICD-10-CM | POA: Diagnosis not present

## 2019-07-02 DIAGNOSIS — I6523 Occlusion and stenosis of bilateral carotid arteries: Secondary | ICD-10-CM | POA: Diagnosis not present

## 2019-07-02 DIAGNOSIS — J440 Chronic obstructive pulmonary disease with acute lower respiratory infection: Secondary | ICD-10-CM | POA: Diagnosis not present

## 2019-07-02 DIAGNOSIS — M5430 Sciatica, unspecified side: Secondary | ICD-10-CM | POA: Diagnosis not present

## 2019-07-02 DIAGNOSIS — I10 Essential (primary) hypertension: Secondary | ICD-10-CM | POA: Diagnosis not present

## 2019-07-02 DIAGNOSIS — J441 Chronic obstructive pulmonary disease with (acute) exacerbation: Secondary | ICD-10-CM | POA: Diagnosis not present

## 2019-07-02 DIAGNOSIS — K219 Gastro-esophageal reflux disease without esophagitis: Secondary | ICD-10-CM | POA: Diagnosis not present

## 2019-07-02 DIAGNOSIS — G8929 Other chronic pain: Secondary | ICD-10-CM | POA: Diagnosis not present

## 2019-07-02 DIAGNOSIS — M21371 Foot drop, right foot: Secondary | ICD-10-CM | POA: Diagnosis not present

## 2019-07-09 DIAGNOSIS — J440 Chronic obstructive pulmonary disease with acute lower respiratory infection: Secondary | ICD-10-CM | POA: Diagnosis not present

## 2019-07-09 DIAGNOSIS — J441 Chronic obstructive pulmonary disease with (acute) exacerbation: Secondary | ICD-10-CM | POA: Diagnosis not present

## 2019-07-09 DIAGNOSIS — J188 Other pneumonia, unspecified organism: Secondary | ICD-10-CM | POA: Diagnosis not present

## 2019-07-09 DIAGNOSIS — I6523 Occlusion and stenosis of bilateral carotid arteries: Secondary | ICD-10-CM | POA: Diagnosis not present

## 2019-07-09 DIAGNOSIS — G8929 Other chronic pain: Secondary | ICD-10-CM | POA: Diagnosis not present

## 2019-07-09 DIAGNOSIS — K219 Gastro-esophageal reflux disease without esophagitis: Secondary | ICD-10-CM | POA: Diagnosis not present

## 2019-07-09 DIAGNOSIS — K449 Diaphragmatic hernia without obstruction or gangrene: Secondary | ICD-10-CM | POA: Diagnosis not present

## 2019-07-09 DIAGNOSIS — I10 Essential (primary) hypertension: Secondary | ICD-10-CM | POA: Diagnosis not present

## 2019-07-09 DIAGNOSIS — M21371 Foot drop, right foot: Secondary | ICD-10-CM | POA: Diagnosis not present

## 2019-07-09 DIAGNOSIS — M1990 Unspecified osteoarthritis, unspecified site: Secondary | ICD-10-CM | POA: Diagnosis not present

## 2019-07-09 DIAGNOSIS — M5430 Sciatica, unspecified side: Secondary | ICD-10-CM | POA: Diagnosis not present

## 2019-07-11 DIAGNOSIS — M21371 Foot drop, right foot: Secondary | ICD-10-CM | POA: Diagnosis not present

## 2019-07-11 DIAGNOSIS — Z720 Tobacco use: Secondary | ICD-10-CM | POA: Diagnosis not present

## 2019-07-11 DIAGNOSIS — J440 Chronic obstructive pulmonary disease with acute lower respiratory infection: Secondary | ICD-10-CM | POA: Diagnosis not present

## 2019-07-11 DIAGNOSIS — M5430 Sciatica, unspecified side: Secondary | ICD-10-CM | POA: Diagnosis not present

## 2019-07-11 DIAGNOSIS — I6523 Occlusion and stenosis of bilateral carotid arteries: Secondary | ICD-10-CM | POA: Diagnosis not present

## 2019-07-11 DIAGNOSIS — J449 Chronic obstructive pulmonary disease, unspecified: Secondary | ICD-10-CM | POA: Diagnosis not present

## 2019-07-11 DIAGNOSIS — K219 Gastro-esophageal reflux disease without esophagitis: Secondary | ICD-10-CM | POA: Diagnosis not present

## 2019-07-11 DIAGNOSIS — J441 Chronic obstructive pulmonary disease with (acute) exacerbation: Secondary | ICD-10-CM | POA: Diagnosis not present

## 2019-07-11 DIAGNOSIS — I10 Essential (primary) hypertension: Secondary | ICD-10-CM | POA: Diagnosis not present

## 2019-07-11 DIAGNOSIS — G894 Chronic pain syndrome: Secondary | ICD-10-CM | POA: Diagnosis not present

## 2019-07-11 DIAGNOSIS — M1990 Unspecified osteoarthritis, unspecified site: Secondary | ICD-10-CM | POA: Diagnosis not present

## 2019-07-11 DIAGNOSIS — J188 Other pneumonia, unspecified organism: Secondary | ICD-10-CM | POA: Diagnosis not present

## 2019-07-11 DIAGNOSIS — G8929 Other chronic pain: Secondary | ICD-10-CM | POA: Diagnosis not present

## 2019-07-11 DIAGNOSIS — K449 Diaphragmatic hernia without obstruction or gangrene: Secondary | ICD-10-CM | POA: Diagnosis not present

## 2019-07-15 DIAGNOSIS — J440 Chronic obstructive pulmonary disease with acute lower respiratory infection: Secondary | ICD-10-CM | POA: Diagnosis not present

## 2019-07-15 DIAGNOSIS — I10 Essential (primary) hypertension: Secondary | ICD-10-CM | POA: Diagnosis not present

## 2019-07-15 DIAGNOSIS — I6523 Occlusion and stenosis of bilateral carotid arteries: Secondary | ICD-10-CM | POA: Diagnosis not present

## 2019-07-15 DIAGNOSIS — J188 Other pneumonia, unspecified organism: Secondary | ICD-10-CM | POA: Diagnosis not present

## 2019-10-09 DIAGNOSIS — I10 Essential (primary) hypertension: Secondary | ICD-10-CM | POA: Diagnosis not present

## 2019-10-09 DIAGNOSIS — Z720 Tobacco use: Secondary | ICD-10-CM | POA: Diagnosis not present

## 2019-10-09 DIAGNOSIS — J449 Chronic obstructive pulmonary disease, unspecified: Secondary | ICD-10-CM | POA: Diagnosis not present

## 2019-10-09 DIAGNOSIS — E7849 Other hyperlipidemia: Secondary | ICD-10-CM | POA: Diagnosis not present

## 2019-10-20 ENCOUNTER — Encounter: Payer: Self-pay | Admitting: *Deleted

## 2019-10-20 ENCOUNTER — Ambulatory Visit
Admission: EM | Admit: 2019-10-20 | Discharge: 2019-10-20 | Disposition: A | Discharge status: Home / Self Care | Payer: Medicare Other | Attending: Emergency Medicine | Admitting: Emergency Medicine

## 2019-10-20 ENCOUNTER — Ambulatory Visit (INDEPENDENT_AMBULATORY_CARE_PROVIDER_SITE_OTHER): Payer: Medicare Other

## 2019-10-20 ENCOUNTER — Other Ambulatory Visit: Payer: Self-pay

## 2019-10-20 DIAGNOSIS — J189 Pneumonia, unspecified organism: Secondary | ICD-10-CM

## 2019-10-20 DIAGNOSIS — H60392 Other infective otitis externa, left ear: Secondary | ICD-10-CM | POA: Diagnosis not present

## 2019-10-20 DIAGNOSIS — R05 Cough: Secondary | ICD-10-CM

## 2019-10-20 DIAGNOSIS — R6889 Other general symptoms and signs: Secondary | ICD-10-CM | POA: Diagnosis not present

## 2019-10-20 DIAGNOSIS — Z1152 Encounter for screening for COVID-19: Secondary | ICD-10-CM

## 2019-10-20 DIAGNOSIS — H6123 Impacted cerumen, bilateral: Secondary | ICD-10-CM | POA: Diagnosis not present

## 2019-10-20 HISTORY — DX: Chronic obstructive pulmonary disease, unspecified: J44.9

## 2019-10-20 HISTORY — DX: Pneumonia, unspecified organism: J18.9

## 2019-10-20 MED ORDER — AZITHROMYCIN 250 MG PO TABS: 250.0000 mg | ORAL_TABLET | Freq: Every day | ORAL | 0 refills | Status: DC

## 2019-10-20 MED ORDER — FLUTICASONE PROPIONATE 50 MCG/ACT NA SUSP: 1.0000 | Freq: Every day | NASAL | 0 refills | Status: DC

## 2019-10-20 MED ORDER — CIPROFLOXACIN-DEXAMETHASONE 0.3-0.1 % OT SUSP: 4.0000 [drp] | Freq: Two times a day (BID) | OTIC | 0 refills | Status: DC

## 2019-10-20 NOTE — ED Provider Notes (Signed)
[facility]    CSN: [id number] Arrival date & time: 10/20/19  [id number]      History   Chief Complaint Chief Complaint  Patient presents with  . Cough  . Otalgia    HPI Peter [name] is a 86 y.o. male.   With history of chronic cough presented to the urgent care with worsening cough that is now productive for the past 4 days. report yellowish dark  mucus. Reported left ear pain and decreased hearing therefore off balance than usual. Denies sick exposure to COVID, flu or strep.  Denies recent travel.  Denies aggravating or alleviating symptoms.  Denies previous COVID infection.   Denies fever, chills, fatigue, nasal congestion, rhinorrhea, sore throat, SOB, wheezing, chest pain, nausea, vomiting, changes in bowel or bladder habits.    The history is provided by the patient. No language interpreter was used.  Cough Associated symptoms: ear pain   Otalgia Associated symptoms: cough and hearing loss     Past Medical History:  Diagnosis Date  . Anxiety   . Arthritis   . BPH (benign prostatic hyperplasia)   . Chronic pain   . Complication of anesthesia    had an anxiety attack once  . COPD (chronic obstructive pulmonary disease) ([place])   . Depression   . GERD (gastroesophageal reflux disease)   . Hiatal hernia    small  . History of nuclear stress test    Nuc 5/19: soft tissue attenuation; no ischemia or scar, EF 72; Low Risk  . HTN (hypertension)   . Internal carotid artery stenosis, left   . Pneumonia   . Prostate disease    h/o elevated PSA, neg bx, [facility]  . Sciatica   . Wears glasses   . Wears partial dentures     Patient Active Problem List   Diagnosis Date Noted  . Community acquired pneumonia 06/14/2019  . COPD with acute exacerbation ([place]) 06/14/2019  . Foot drop, right 06/14/2019  . PAD (peripheral artery disease) ([place]) 11/28/2017  . Hyperlipidemia 11/28/2017  . Essential hypertension 11/28/2017  . Atherosclerosis of native arteries  of extremity with rest pain ([place]) 10/18/2017  . Carotid stenosis 03/20/2017  . Bilateral carotid artery disease ([place]) 03/17/2017  . Leukocytosis 01/18/2017  . Irregular heart beat 04/03/2013  . Constipation 05/16/2011  . Esophageal dysphagia 11/03/2010  . GERD (gastroesophageal reflux disease) 11/03/2010  . Schatzki's ring 11/03/2010    Past Surgical History:  Procedure Laterality Date  . ABDOMINAL AORTOGRAM W/LOWER EXTREMITY N/A 10/26/2017   Procedure: ABDOMINAL AORTOGRAM W/LOWER EXTREMITY;  Surgeon: Waynetta Sandy, MD;  Location: [facility];  Service: Cardiovascular;  Laterality: N/A;  bilateral  . BALLOON DILATION  04/23/2018   Procedure: BALLOON DILATION;  Surgeon: Daneil Dolin, MD;  [facility];  Service: Endoscopy;;  esophagus  . BIOPSY  01/29/2014   Procedure: BIOPSY;  Surgeon: Daneil Dolin, MD;  Location: [facility];  Service: Endoscopy;;  Gastric and Esophageal  . BIOPSY  06/14/2018   Procedure: BIOPSY;  Surgeon: Daneil Dolin, MD;  [facility];  Service: Endoscopy;;  esophagus  . CAROTID ENDARTERECTOMY Left 03/20/2017  . CATARACT EXTRACTION W/PHACO  05/21/2012   Procedure: CATARACT EXTRACTION PHACO AND INTRAOCULAR LENS PLACEMENT (IOC);  Surgeon: Tonny Branch, MD;  Location: [facility];  Service: Ophthalmology;  Laterality: Right;  CDE 19.34  . COLONOSCOPY  12/2004   diverticulosis, hyperplastic polyps, hemorrhoids  . ENDARTERECTOMY Left 03/20/2017   Procedure: ENDARTERECTOMY CAROTID-LEFT;  Surgeon: Conrad Ronan, MD;  Location: [facility];  Service: Vascular;  Laterality: Left;  . ESOPHAGOGASTRODUODENOSCOPY  2003   schatzki ring, small vascular abnormality of duodenal bulb ?Dieulafoy's lesion (ablated)  . ESOPHAGOGASTRODUODENOSCOPY  11/10/10   Dr. Gala Romney dilation 2 rings with 42F  . ESOPHAGOGASTRODUODENOSCOPY N/A 01/29/2014   Dr.Rourk- multiple esophageal rings/webs- s/p dialation. small hiatal hernia. abnormal gastric mucosa. bx= mild  chronic inactive gastritis (oxyntic mucosa), squamous mucosa with epithelial changes consistent with reflux related injury.  . ESOPHAGOGASTRODUODENOSCOPY (EGD) WITH PROPOFOL N/A 04/23/2018   Procedure: ESOPHAGOGASTRODUODENOSCOPY (EGD) WITH PROPOFOL;  Surgeon: Daneil Dolin, MD;  [facility];  Service: Endoscopy;  Laterality: N/A;  2:00pm  . ESOPHAGOGASTRODUODENOSCOPY (EGD) WITH PROPOFOL N/A 06/14/2018   Dr. Gala Romney: Obstructing Schatzki ring, status post dilation medium-sized hiatal hernia, biopsies from the mid to distal esophagus obtained which were benign.  Marland Kitchen MALONEY DILATION N/A 06/14/2018   Procedure: Venia Minks DILATION;  Surgeon: Daneil Dolin, MD;  [facility];  Service: Endoscopy;  Laterality: N/A;  . MULTIPLE TOOTH EXTRACTIONS    . PATCH ANGIOPLASTY Left 03/20/2017  . PATCH ANGIOPLASTY Left 03/20/2017   Procedure: PATCH ANGIOPLASTY;  Surgeon: Conrad [place], MD;  Location[facility];  Service: Vascular;  Laterality: Left;       Home Medications    Prior to Admission medications   Medication Sig Start Date End Date Taking? Authorizing Provider  ALPRAZolam Duanne Moron) 0.5 MG tablet Take 0.5 mg by mouth at bedtime.    Yes [provider]  amLODipine (NORVASC) 5 MG tablet Take 5 mg by mouth daily.  11/28/16  Yes [provider]  aspirin EC 81 MG tablet Take 81 mg by mouth daily.    Yes [provider]  finasteride (PROSCAR) 5 MG tablet Take 5 mg by mouth daily after lunch.    Yes [provider]  FLUoxetine (PROZAC) 10 MG capsule Take 10 mg by mouth daily. 06/10/19  Yes [provider]  lisinopril (PRINIVIL,ZESTRIL) 40 MG tablet Take 40 mg by mouth daily.  12/08/16  Yes [provider]  albuterol (VENTOLIN HFA) 108 (90 Base) MCG/ACT inhaler Inhale 1-2 puffs into the lungs every 6 (six) hours as needed for wheezing or shortness of breath. 06/17/19   Manuella Ghazi, Pratik D, DO  azithromycin (ZITHROMAX) 250 MG tablet Take 1 tablet  (250 mg total) by mouth daily. Take first 2 tablets together, then 1 every day until finished. 10/20/19   Pansy Ostrovsky, Darrelyn Hillock, FNP  ciprofloxacin-dexamethasone (CIPRODEX) OTIC suspension Place 4 drops into the left ear 2 (two) times daily. 10/20/19   Jozef Eisenbeis, Darrelyn Hillock, FNP  fluticasone (FLONASE) 50 MCG/ACT nasal spray Place 1 spray into both nostrils daily for 14 days. 10/20/19 11/03/19  Johnny Latu, Darrelyn Hillock, FNP  lactulose (CHRONULAC) 10 GM/15ML solution TAKE TWO TABLESPOONFULS AT BEDTIME AS NEEDED. Patient taking differently: Take 20 g by mouth at bedtime as needed for mild constipation. TAKE TWO TABLESPOONFULS AT BEDTIME AS NEEDED. 10/04/18   Mahala Menghini, PA-C  mometasone-formoterol (DULERA) 200-5 MCG/ACT AERO Inhale 1 puff into the lungs 2 (two) times daily. 06/17/19   Manuella Ghazi, Pratik D, DO  pantoprazole (PROTONIX) 40 MG tablet Take 1 tablet (40 mg total) by mouth daily before breakfast. 12/10/18   Mahala Menghini, PA-C    Family History Family History  Problem Relation Age of Onset  . Stroke Father 22       deceased  . Heart attack Mother   . Colon cancer Neg  Hx     Social History Social History   Tobacco Use  . Smoking status: Current Every Day Smoker    Packs/day: 1.00    Years: 60.00    Pack years: 60.00    Types: Cigarettes  . Smokeless tobacco: Never Used  Substance Use Topics  . Alcohol use: No    Comment: quit 10  years ago  . Drug use: No     Allergies   Penicillins   Review of Systems Review of Systems  Constitutional: Negative.   HENT: Positive for ear pain and hearing loss.   Respiratory: Positive for cough.   Cardiovascular: Negative.   Gastrointestinal: Negative.   Neurological: Negative.        Imbalance  All other systems reviewed and are negative.    Physical Exam Triage Vital Signs ED Triage Vitals  Enc Vitals Group     BP 10/20/19 0940 (!) 165/70     Pulse Rate 10/20/19 0940 95     Resp 10/20/19 0940 (!) 26     Temp 10/20/19 0940 98.5 F (36.9  C)     Temp Source 10/20/19 0940 Temporal     SpO2 10/20/19 0940 95 %     Weight --      Height --      Head Circumference --      Peak Flow --      Pain Score 10/20/19 0944 0     Pain Loc --      Pain Edu? --      Excl. in [place]? --    No data found.  Updated Vital Signs BP (!) 165/70   Pulse 95   Temp 98.5 F (36.9 C) (Temporal)   Resp (!) 26   SpO2 95%   Visual Acuity Right Eye Distance:   Left Eye Distance:   Bilateral Distance:    Right Eye Near:   Left Eye Near:    Bilateral Near:     Physical Exam Vitals and nursing note reviewed.  Constitutional:      General: He is not in acute distress.    Appearance: Normal appearance. He is normal weight. He is not ill-appearing or toxic-appearing.  HENT:     Head: Normocephalic.     Comments: Post earwax removal: Right TM wnl.  Unable to visualize left TM.  Pain when pulling left ear tragus.  No drainage    Right Ear: Tympanic membrane, ear canal and external ear normal. There is impacted cerumen.     Left Ear: Tympanic membrane, ear canal and external ear normal. There is impacted cerumen.     Nose: Nose normal. No congestion.     Mouth/Throat:     Mouth: Mucous membranes are moist.     Pharynx: No oropharyngeal exudate or posterior oropharyngeal erythema.  Cardiovascular:     Rate and Rhythm: Normal rate and regular rhythm.     Pulses: Normal pulses.     Heart sounds: Normal heart sounds. No murmur. No friction rub. No gallop.   Pulmonary:     Effort: Pulmonary effort is normal. Tachypnea present. No respiratory distress.     Breath sounds: Normal breath sounds. No stridor. No wheezing, rhonchi or rales.  Chest:     Chest wall: No tenderness.  Neurological:     Mental Status: He is alert.      UC Treatments / Results  Labs (all labs ordered are listed, but only abnormal results are displayed) Labs Reviewed  NOVEL CORONAVIRUS, NAA  EKG   Radiology DG Chest 2 View  Result Date: 10/20/2019 CLINICAL  DATA:  Worsening cough. EXAM: CHEST - 2 VIEW COMPARISON:  June 14, 2019 FINDINGS: There is mild opacity in the left lung base. There is apparent nodularity measuring up to 10 mm in the left base, not seen June 14, 2019. No other focal infiltrates or nodules. The heart, hila, mediastinum, and pleura are normal. IMPRESSION: 1. Mild opacity in left base may represent pneumonia given history. 2. Possible nodularity in the left mid lung may be part of the apparent infectious process given development since June 14, 2019. 3. Recommend short-term follow-up imaging after treatment to ensure resolution of these x-ray findings. Electronically Signed   By: Dorise Bullion III M.D   On: 10/20/2019 10:35    Procedures Procedures (including critical care time)  Medications Ordered in UC Medications - No data to display  Initial Impression / Assessment and Plan / UC Course  I have reviewed the triage vital signs and the nursing notes.  Pertinent labs & imaging results that were available during my care of the patient were reviewed by me and considered in my medical decision making (see chart for details).    Patient stable at discharge.  X-ray is positive for possible pneumonia.  I have reviewed the x-ray myself and the radiologist interpretation.  I am in agreement with the radiologist interpretation.  We will treat patient for possible pneumonia. Flonase and Ciprodex otic prescribed for possible otitis externa Was advised to follow-up with PCP for further evaluation. Return or go to ED for worsening of symptoms Final Clinical Impressions(s) / UC Diagnoses   Final diagnoses:  Pneumonia of left lower lobe due to infectious organism  Encounter for screening for COVID-19  Bilateral impacted cerumen  Infective otitis externa of left ear     Discharge Instructions     Take antibiotic as prescribed for possible pneumonia Ciprodex was prescribed for possible otitis externa Flonase was  prescribed.  COVID testing ordered.  It will take between 2-7 days for test results.  Someone will contact you regarding abnormal results.    If your result is positive: You should remain isolated in your home for 10 days from symptom onset AND greater than 24 hours after symptoms resolution (absence of fever without the use of fever-reducing medication and improvement in respiratory symptoms), whichever is longer Get plenty of rest and push fluids Tessalon Perles prescribed for cough Zyrtec-D prescribed for nasal congestion, runny nose, and/or sore throat Flonase prescribed for nasal congestion and runny nose Use medications daily for symptom relief Use OTC medications like ibuprofen or tylenol as needed fever or pain Call or go to the ED if you have any new or worsening symptoms such as fever, worsening cough, shortness of breath, chest tightness, chest pain, turning blue, changes in mental status, etc...     ED Prescriptions    Medication Sig Dispense Auth. Provider   azithromycin (ZITHROMAX) 250 MG tablet Take 1 tablet (250 mg total) by mouth daily. Take first 2 tablets together, then 1 every day until finished. 6 tablet Criag Wicklund, Darrelyn Hillock, FNP   ciprofloxacin-dexamethasone (CIPRODEX) OTIC suspension Place 4 drops into the left ear 2 (two) times daily. 7.5 mL Antonieta Slaven S, FNP   fluticasone (FLONASE) 50 MCG/ACT nasal spray Place 1 spray into both nostrils daily for 14 days. 16 g Emerson Monte, FNP     PDMP not reviewed this encounter.   Emerson Monte, FNP 10/20/19 1104

## 2019-10-20 NOTE — ED Triage Notes (Addendum)
Per caregiver, pt has chronic cough, but c/o productive cough x 4 days without fever.  Also c/o left ear pain and decreased hearing.  Caregiver states he is more off balance than usual.  Pt walks with difficulty with walker today. Caregiver also noted his left face seems swollen.

## 2019-10-20 NOTE — Discharge Instructions (Addendum)
Take antibiotic as prescribed for possible pneumonia Ciprodex was prescribed for possible otitis externa Flonase was prescribed.  COVID testing ordered.  It will take between 2-7 days for test results.  Someone will contact you regarding abnormal results.    If your result is positive: You should remain isolated in your home for 10 days from symptom onset AND greater than 24 hours after symptoms resolution (absence of fever without the use of fever-reducing medication and improvement in respiratory symptoms), whichever is longer Get plenty of rest and push fluids Tessalon Perles prescribed for cough Zyrtec-D prescribed for nasal congestion, runny nose, and/or sore throat Flonase prescribed for nasal congestion and runny nose Use medications daily for symptom relief Use OTC medications like ibuprofen or tylenol as needed fever or pain Call or go to the ED if you have any new or worsening symptoms such as fever, worsening cough, shortness of breath, chest tightness, chest pain, turning blue, changes in mental status, etc..Marland Kitchen

## 2019-10-21 LAB — NOVEL CORONAVIRUS, NAA: SARS-CoV-2, NAA: NOT DETECTED

## 2019-10-21 LAB — SARS-COV-2, NAA 2 DAY TAT

## 2019-11-04 DIAGNOSIS — Z0001 Encounter for general adult medical examination with abnormal findings: Secondary | ICD-10-CM | POA: Diagnosis not present

## 2019-11-04 DIAGNOSIS — Z1389 Encounter for screening for other disorder: Secondary | ICD-10-CM | POA: Diagnosis not present

## 2019-11-04 DIAGNOSIS — Z6822 Body mass index (BMI) 22.0-22.9, adult: Secondary | ICD-10-CM | POA: Diagnosis not present

## 2019-11-04 DIAGNOSIS — H6123 Impacted cerumen, bilateral: Secondary | ICD-10-CM | POA: Diagnosis not present

## 2019-11-08 DIAGNOSIS — G894 Chronic pain syndrome: Secondary | ICD-10-CM | POA: Diagnosis not present

## 2019-11-08 DIAGNOSIS — I10 Essential (primary) hypertension: Secondary | ICD-10-CM | POA: Diagnosis not present

## 2020-02-07 DIAGNOSIS — G894 Chronic pain syndrome: Secondary | ICD-10-CM | POA: Diagnosis not present

## 2020-02-07 DIAGNOSIS — E7849 Other hyperlipidemia: Secondary | ICD-10-CM | POA: Diagnosis not present

## 2020-02-07 DIAGNOSIS — I10 Essential (primary) hypertension: Secondary | ICD-10-CM | POA: Diagnosis not present

## 2020-03-23 ENCOUNTER — Other Ambulatory Visit: Payer: Self-pay

## 2020-03-23 MED ORDER — FINASTERIDE 5 MG PO TABS: 5.0000 mg | ORAL_TABLET | Freq: Every day | ORAL | 3 refills | Status: DC

## 2020-04-09 DIAGNOSIS — J449 Chronic obstructive pulmonary disease, unspecified: Secondary | ICD-10-CM | POA: Diagnosis not present

## 2020-04-09 DIAGNOSIS — Z720 Tobacco use: Secondary | ICD-10-CM | POA: Diagnosis not present

## 2020-04-09 DIAGNOSIS — E7849 Other hyperlipidemia: Secondary | ICD-10-CM | POA: Diagnosis not present

## 2020-04-09 DIAGNOSIS — I10 Essential (primary) hypertension: Secondary | ICD-10-CM | POA: Diagnosis not present

## 2020-04-18 DIAGNOSIS — R5381 Other malaise: Secondary | ICD-10-CM | POA: Diagnosis not present

## 2020-04-18 DIAGNOSIS — I959 Hypotension, unspecified: Secondary | ICD-10-CM | POA: Diagnosis not present

## 2020-04-20 ENCOUNTER — Other Ambulatory Visit: Payer: Self-pay | Admitting: Gastroenterology

## 2020-04-21 ENCOUNTER — Telehealth: Payer: Self-pay | Admitting: Internal Medicine

## 2020-04-21 NOTE — Telephone Encounter (Signed)
Refill request for Pantoprazole 40 mg bid.

## 2020-04-21 NOTE — Telephone Encounter (Signed)
Looks like his prescription is for Protonix 40 mg daily. This is what Neil Crouch, PA-C recommended he continue at his last OV in 2020. Is he taking Protonix once or twice daily?

## 2020-04-21 NOTE — Telephone Encounter (Signed)
Patient needs protonix sent to [place]   He only has 2 pills left, he can not come in for an appointment but he cant walk.

## 2020-04-22 NOTE — Telephone Encounter (Signed)
Correction. Medication request should be written on once daily.

## 2020-04-22 NOTE — Telephone Encounter (Signed)
Noted  

## 2020-04-22 NOTE — Telephone Encounter (Signed)
Rx sent 

## 2020-05-05 ENCOUNTER — Ambulatory Visit: Payer: Medicare Other | Admitting: Urology

## 2020-05-09 DIAGNOSIS — Z720 Tobacco use: Secondary | ICD-10-CM | POA: Diagnosis not present

## 2020-05-09 DIAGNOSIS — I10 Essential (primary) hypertension: Secondary | ICD-10-CM | POA: Diagnosis not present

## 2020-05-09 DIAGNOSIS — E7849 Other hyperlipidemia: Secondary | ICD-10-CM | POA: Diagnosis not present

## 2020-05-09 DIAGNOSIS — J449 Chronic obstructive pulmonary disease, unspecified: Secondary | ICD-10-CM | POA: Diagnosis not present

## 2020-06-09 DIAGNOSIS — I10 Essential (primary) hypertension: Secondary | ICD-10-CM | POA: Diagnosis not present

## 2020-06-09 DIAGNOSIS — E7849 Other hyperlipidemia: Secondary | ICD-10-CM | POA: Diagnosis not present

## 2020-06-09 DIAGNOSIS — J449 Chronic obstructive pulmonary disease, unspecified: Secondary | ICD-10-CM | POA: Diagnosis not present

## 2020-06-09 DIAGNOSIS — Z720 Tobacco use: Secondary | ICD-10-CM | POA: Diagnosis not present

## 2020-08-08 DIAGNOSIS — J449 Chronic obstructive pulmonary disease, unspecified: Secondary | ICD-10-CM | POA: Diagnosis not present

## 2020-08-08 DIAGNOSIS — I10 Essential (primary) hypertension: Secondary | ICD-10-CM | POA: Diagnosis not present

## 2020-08-08 DIAGNOSIS — E7849 Other hyperlipidemia: Secondary | ICD-10-CM | POA: Diagnosis not present

## 2020-08-08 DIAGNOSIS — Z720 Tobacco use: Secondary | ICD-10-CM | POA: Diagnosis not present

## 2020-09-25 DIAGNOSIS — Z85828 Personal history of other malignant neoplasm of skin: Secondary | ICD-10-CM | POA: Diagnosis not present

## 2020-09-25 DIAGNOSIS — C44329 Squamous cell carcinoma of skin of other parts of face: Secondary | ICD-10-CM | POA: Diagnosis not present

## 2020-09-25 DIAGNOSIS — D485 Neoplasm of uncertain behavior of skin: Secondary | ICD-10-CM | POA: Diagnosis not present

## 2020-09-25 DIAGNOSIS — L905 Scar conditions and fibrosis of skin: Secondary | ICD-10-CM | POA: Diagnosis not present

## 2020-09-25 DIAGNOSIS — C44212 Basal cell carcinoma of skin of right ear and external auricular canal: Secondary | ICD-10-CM | POA: Diagnosis not present

## 2020-10-07 DIAGNOSIS — E7849 Other hyperlipidemia: Secondary | ICD-10-CM | POA: Diagnosis not present

## 2020-10-07 DIAGNOSIS — Z720 Tobacco use: Secondary | ICD-10-CM | POA: Diagnosis not present

## 2020-10-07 DIAGNOSIS — I10 Essential (primary) hypertension: Secondary | ICD-10-CM | POA: Diagnosis not present

## 2020-10-07 DIAGNOSIS — J449 Chronic obstructive pulmonary disease, unspecified: Secondary | ICD-10-CM | POA: Diagnosis not present

## 2020-10-19 DIAGNOSIS — L570 Actinic keratosis: Secondary | ICD-10-CM | POA: Diagnosis not present

## 2020-10-19 DIAGNOSIS — D0439 Carcinoma in situ of skin of other parts of face: Secondary | ICD-10-CM | POA: Diagnosis not present

## 2020-10-19 DIAGNOSIS — C44329 Squamous cell carcinoma of skin of other parts of face: Secondary | ICD-10-CM | POA: Diagnosis not present

## 2020-10-19 DIAGNOSIS — D485 Neoplasm of uncertain behavior of skin: Secondary | ICD-10-CM | POA: Diagnosis not present

## 2020-11-09 DIAGNOSIS — D0422 Carcinoma in situ of skin of left ear and external auricular canal: Secondary | ICD-10-CM | POA: Diagnosis not present

## 2020-11-09 DIAGNOSIS — D485 Neoplasm of uncertain behavior of skin: Secondary | ICD-10-CM | POA: Diagnosis not present

## 2020-11-09 DIAGNOSIS — C44329 Squamous cell carcinoma of skin of other parts of face: Secondary | ICD-10-CM | POA: Diagnosis not present

## 2020-11-17 DIAGNOSIS — I10 Essential (primary) hypertension: Secondary | ICD-10-CM | POA: Diagnosis not present

## 2020-11-23 DIAGNOSIS — C44329 Squamous cell carcinoma of skin of other parts of face: Secondary | ICD-10-CM | POA: Diagnosis not present

## 2020-11-23 DIAGNOSIS — L570 Actinic keratosis: Secondary | ICD-10-CM | POA: Diagnosis not present

## 2020-11-23 DIAGNOSIS — C44212 Basal cell carcinoma of skin of right ear and external auricular canal: Secondary | ICD-10-CM | POA: Diagnosis not present

## 2020-11-23 DIAGNOSIS — D0439 Carcinoma in situ of skin of other parts of face: Secondary | ICD-10-CM | POA: Diagnosis not present

## 2020-12-02 DIAGNOSIS — Z0001 Encounter for general adult medical examination with abnormal findings: Secondary | ICD-10-CM | POA: Diagnosis not present

## 2020-12-02 DIAGNOSIS — Z1389 Encounter for screening for other disorder: Secondary | ICD-10-CM | POA: Diagnosis not present

## 2020-12-02 DIAGNOSIS — Z0000 Encounter for general adult medical examination without abnormal findings: Secondary | ICD-10-CM | POA: Diagnosis not present

## 2020-12-02 DIAGNOSIS — Z6822 Body mass index (BMI) 22.0-22.9, adult: Secondary | ICD-10-CM | POA: Diagnosis not present

## 2020-12-02 DIAGNOSIS — E782 Mixed hyperlipidemia: Secondary | ICD-10-CM | POA: Diagnosis not present

## 2020-12-02 DIAGNOSIS — E7849 Other hyperlipidemia: Secondary | ICD-10-CM | POA: Diagnosis not present

## 2021-01-07 DIAGNOSIS — E782 Mixed hyperlipidemia: Secondary | ICD-10-CM | POA: Diagnosis not present

## 2021-01-07 DIAGNOSIS — J449 Chronic obstructive pulmonary disease, unspecified: Secondary | ICD-10-CM | POA: Diagnosis not present

## 2021-01-07 DIAGNOSIS — I10 Essential (primary) hypertension: Secondary | ICD-10-CM | POA: Diagnosis not present

## 2021-03-11 ENCOUNTER — Other Ambulatory Visit: Payer: Self-pay

## 2021-03-11 ENCOUNTER — Telehealth: Payer: Self-pay | Admitting: Urology

## 2021-03-11 MED ORDER — FINASTERIDE 5 MG PO TABS: 5.0000 mg | ORAL_TABLET | Freq: Every day | ORAL | 0 refills | Status: DC

## 2021-03-11 NOTE — Telephone Encounter (Signed)
Pt called needing a refill on his finasteride. Please call pt when you can.

## 2021-03-11 NOTE — Telephone Encounter (Signed)
1 refill given. Pharmacy aware patient will need f/u for any future refills. Patient will contact office for appt.

## 2021-03-12 DIAGNOSIS — I10 Essential (primary) hypertension: Secondary | ICD-10-CM | POA: Diagnosis not present

## 2021-04-16 ENCOUNTER — Ambulatory Visit
Admission: EM | Admit: 2021-04-16 | Discharge: 2021-04-16 | Disposition: A | Discharge status: Home / Self Care | Payer: Medicare Other | Attending: Family Medicine | Admitting: Family Medicine

## 2021-04-16 ENCOUNTER — Other Ambulatory Visit: Payer: Self-pay

## 2021-04-16 ENCOUNTER — Encounter: Payer: Self-pay | Admitting: Emergency Medicine

## 2021-04-16 DIAGNOSIS — S91204A Unspecified open wound of right lesser toe(s) with damage to nail, initial encounter: Secondary | ICD-10-CM | POA: Diagnosis not present

## 2021-04-16 DIAGNOSIS — L089 Local infection of the skin and subcutaneous tissue, unspecified: Secondary | ICD-10-CM | POA: Diagnosis not present

## 2021-04-16 DIAGNOSIS — S91209A Unspecified open wound of unspecified toe(s) with damage to nail, initial encounter: Secondary | ICD-10-CM

## 2021-04-16 MED ORDER — DOXYCYCLINE HYCLATE 100 MG PO CAPS: 100.0000 mg | ORAL_CAPSULE | Freq: Two times a day (BID) | ORAL | 0 refills | Status: DC

## 2021-04-16 MED ORDER — HIBICLENS 4 % EX LIQD: Freq: Every day | CUTANEOUS | 0 refills | Status: DC | PRN

## 2021-04-16 NOTE — ED Provider Notes (Signed)
[facility]    CSN: [id number] Arrival date & time: 04/16/21  2951      History   Chief Complaint No chief complaint on file.   HPI Peter [name] is a 87 y.o. male.   Patient presenting today with several day history of worsening right third toe redness, swelling, pain that is now streaking up into the foot.  He states that several days ago prior to onset of this his toenail with standing straight up off of his nailbed and he has since had progressive issues and now purulent drainage from the area.  Denies fever, chills, sweats, body aches.  So far not tried anything over-the-counter for symptoms.  Denies any history of similar issues.  No known history of diabetes.    Past Medical History:  Diagnosis Date   Anxiety    Arthritis    BPH (benign prostatic hyperplasia)    Chronic pain    Complication of anesthesia    had an anxiety attack once   COPD (chronic obstructive pulmonary disease) (HCC)    Depression    GERD (gastroesophageal reflux disease)    Hiatal hernia    small   History of nuclear stress test    Nuc 5/19: soft tissue attenuation; no ischemia or scar, EF 72; Low Risk   HTN (hypertension)    Internal carotid artery stenosis, left    Pneumonia    Prostate disease    h/o elevated PSA, neg bx, [facility]   Sciatica    Wears glasses    Wears partial dentures     Patient Active Problem List   Diagnosis Date Noted   Community acquired pneumonia 06/14/2019   COPD with acute exacerbation ([place]) 06/14/2019   Foot drop, right 06/14/2019   PAD (peripheral artery disease) ([place]) 11/28/2017   Hyperlipidemia 11/28/2017   Essential hypertension 11/28/2017   Atherosclerosis of native arteries of extremity with rest pain ([place]) 10/18/2017   Carotid stenosis 03/20/2017   Bilateral carotid artery disease ([place]) 03/17/2017   Leukocytosis 01/18/2017   Irregular heart beat 04/03/2013   Constipation 05/16/2011   Esophageal dysphagia 11/03/2010   GERD  (gastroesophageal reflux disease) 11/03/2010   Schatzki's ring 11/03/2010    Past Surgical History:  Procedure Laterality Date   ABDOMINAL AORTOGRAM W/LOWER EXTREMITY N/A 10/26/2017   Procedure: ABDOMINAL AORTOGRAM W/LOWER EXTREMITY;  Surgeon: Waynetta Sandy, MD;  Location: [facility];  Service: Cardiovascular;  Laterality: N/A;  bilateral   BALLOON DILATION  04/23/2018   Procedure: BALLOON DILATION;  Surgeon: Daneil Dolin, MD;  [facility];  Service: Endoscopy;;  esophagus   BIOPSY  01/29/2014   Procedure: BIOPSY;  Surgeon: Daneil Dolin, MD;  [facility];  Service: Endoscopy;;  Gastric and Esophageal   BIOPSY  06/14/2018   Procedure: BIOPSY;  Surgeon: Daneil Dolin, MD;  [facility];  Service: Endoscopy;;  esophagus   CAROTID ENDARTERECTOMY Left 03/20/2017   CATARACT EXTRACTION W/PHACO  05/21/2012   Procedure: CATARACT EXTRACTION PHACO AND INTRAOCULAR LENS PLACEMENT ([place]);  Surgeon: Tonny Branch, MD;  Location: [facility];  Service: Ophthalmology;  Laterality: Right;  CDE 19.34   COLONOSCOPY  12/2004   diverticulosis, hyperplastic polyps, hemorrhoids   ENDARTERECTOMY Left 03/20/2017   Procedure: ENDARTERECTOMY CAROTID-LEFT;  Surgeon: Conrad Little Chute, MD;  Location: [facility];  Service: Vascular;  Laterality: Left;   ESOPHAGOGASTRODUODENOSCOPY  2003   schatzki ring, small vascular abnormality of duodenal bulb ?Dieulafoy's lesion (ablated)  ESOPHAGOGASTRODUODENOSCOPY  11/10/10   Dr. Gala Romney dilation 2 rings with 53F   ESOPHAGOGASTRODUODENOSCOPY N/A 01/29/2014   Dr.Rourk- multiple esophageal rings/webs- s/p dialation. small hiatal hernia. abnormal gastric mucosa. bx= mild chronic inactive gastritis (oxyntic mucosa), squamous mucosa with epithelial changes consistent with reflux related injury.   ESOPHAGOGASTRODUODENOSCOPY (EGD) WITH PROPOFOL N/A 04/23/2018   Procedure: ESOPHAGOGASTRODUODENOSCOPY (EGD) WITH PROPOFOL;  Surgeon: Daneil Dolin, MD;   [facility];  Service: Endoscopy;  Laterality: N/A;  2:00pm   ESOPHAGOGASTRODUODENOSCOPY (EGD) WITH PROPOFOL N/A 06/14/2018   Dr. Gala Romney: Obstructing Schatzki ring, status post dilation medium-sized hiatal hernia, biopsies from the mid to distal esophagus obtained which were benign.   MALONEY DILATION N/A 06/14/2018   Procedure: Venia Minks DILATION;  Surgeon: Daneil Dolin, MD;  [facility];  Service: Endoscopy;  Laterality: N/A;   MULTIPLE TOOTH EXTRACTIONS     PATCH ANGIOPLASTY Left 03/20/2017   PATCH ANGIOPLASTY Left 03/20/2017   Procedure: PATCH ANGIOPLASTY;  Surgeon: Conrad Stonecrest, MD;  Location: [facility];  Service: Vascular;  Laterality: Left;       Home Medications    Prior to Admission medications   Medication Sig Start Date End Date Taking? Authorizing Provider  chlorhexidine (HIBICLENS) 4 % external liquid Apply topically daily as needed. 04/16/21  Yes Volney American, PA-C  doxycycline (VIBRAMYCIN) 100 MG capsule Take 1 capsule (100 mg total) by mouth 2 (two) times daily. 04/16/21  Yes Volney American, PA-C  albuterol (VENTOLIN HFA) 108 (90 Base) MCG/ACT inhaler Inhale 1-2 puffs into the lungs every 6 (six) hours as needed for wheezing or shortness of breath. 06/17/19   Manuella Ghazi, Pratik D, DO  ALPRAZolam Peter [name]) 0.5 MG tablet Take 0.5 mg by mouth at bedtime.     [provider]  amLODipine (NORVASC) 5 MG tablet Take 5 mg by mouth daily.  11/28/16   [provider]  aspirin EC 81 MG tablet Take 81 mg by mouth daily.     [provider]  azithromycin (ZITHROMAX) 250 MG tablet Take 1 tablet (250 mg total) by mouth daily. Take first 2 tablets together, then 1 every day until finished. 10/20/19   Avegno, Darrelyn Hillock, FNP  ciprofloxacin-dexamethasone (CIPRODEX) OTIC suspension Place 4 drops into the left ear 2 (two) times daily. 10/20/19   Avegno, Darrelyn Hillock, FNP  finasteride (PROSCAR) 5 MG tablet Take 1 tablet (5 mg total) by mouth daily  after lunch. 03/11/21   Franchot Gallo, MD  FLUoxetine (PROZAC) 10 MG capsule Take 10 mg by mouth daily. 06/10/19   [provider]  fluticasone (FLONASE) 50 MCG/ACT nasal spray Place 1 spray into both nostrils daily for 14 days. 10/20/19 11/03/19  Avegno, Darrelyn Hillock, FNP  lactulose (CHRONULAC) 10 GM/15ML solution TAKE TWO TABLESPOONFULS AT BEDTIME AS NEEDED. Patient taking differently: Take 20 g by mouth at bedtime as needed for mild constipation. TAKE TWO TABLESPOONFULS AT BEDTIME AS NEEDED. 10/04/18   Mahala Menghini, PA-C  lisinopril (PRINIVIL,ZESTRIL) 40 MG tablet Take 40 mg by mouth daily.  12/08/16   [provider]  mometasone-formoterol (DULERA) 200-5 MCG/ACT AERO Inhale 1 puff into the lungs 2 (two) times daily. 06/17/19   Manuella Ghazi, Pratik D, DO  pantoprazole (PROTONIX) 40 MG tablet TAKE (1) TABLET BY MOUTH ONCE DAILY BEFORE BREAKFAST. 04/22/20   Erenest Rasher, PA-C    Family History Family History  Problem Relation Age of Onset   Stroke Father 65       deceased  Heart attack Mother    Colon cancer Neg Hx     Social History Social History   Tobacco Use   Smoking status: Every Day    Packs/day: 1.00    Years: 60.00    Pack years: 60.00    Types: Cigarettes   Smokeless tobacco: Never  Vaping Use   Vaping Use: Never used  Substance Use Topics   Alcohol use: No    Comment: quit 10  years ago   Drug use: No     Allergies   Penicillins   Review of Systems Review of Systems Per HPI  Physical Exam Triage Vital Signs ED Triage Vitals  Enc Vitals Group     BP 04/16/21 1049 (!) 195/65     Pulse Rate 04/16/21 1049 97     Resp 04/16/21 1049 18     Temp 04/16/21 1049 97.6 F (36.4 C)     Temp Source 04/16/21 1049 Oral     SpO2 04/16/21 1049 94 %     Weight --      Height --      Head Circumference --      Peak Flow --      Pain Score 04/16/21 1050 0     Pain Loc --      Pain Edu? --      Excl. in [place]? --    No data found.  Updated Vital  Signs BP (!) 195/65 (BP Location: Right Arm)   Pulse 97   Temp 97.6 F (36.4 C) (Oral)   Resp 18   SpO2 94%   Visual Acuity Right Eye Distance:   Left Eye Distance:   Bilateral Distance:    Right Eye Near:   Left Eye Near:    Bilateral Near:     Physical Exam Vitals and nursing note reviewed.  Constitutional:      Appearance: Normal appearance.  HENT:     Head: Atraumatic.  Eyes:     Extraocular Movements: Extraocular movements intact.     Conjunctiva/sclera: Conjunctivae normal.  Cardiovascular:     Rate and Rhythm: Normal rate and regular rhythm.  Pulmonary:     Effort: Pulmonary effort is normal.     Breath sounds: Normal breath sounds.  Musculoskeletal:        General: Swelling present.     Cervical back: Normal range of motion and neck supple.     Comments: In wheelchair, unclear baseline ambulatory status but states his foot hurts too bad to try walking  Skin:    General: Skin is warm.     Findings: Erythema present.     Comments: Draining paronychia surrounding avulsed right third toe nail.  Diffusely erythematous and edematous down right third toe and into surrounding toes and distal portion of right foot  Neurological:     General: No focal deficit present.     Mental Status: He is oriented to person, place, and time.     Comments: Right lower extremity neurovascularly intact  Psychiatric:        Mood and Affect: Mood normal.        Thought Content: Thought content normal.        Judgment: Judgment normal.     UC Treatments / Results  Labs (all labs ordered are listed, but only abnormal results are displayed) Labs Reviewed - No data to display  EKG   Radiology No results found.  Procedures Procedures (including critical care time)  Medications Ordered in UC Medications -  No data to display  Initial Impression / Assessment and Plan / UC Course  I have reviewed the triage vital signs and the nursing notes.  Pertinent labs & imaging results  that were available during my care of the patient were reviewed by me and considered in my medical decision making (see chart for details).     Paronychia and cellulitis of right foot extending from right third toe, likely initially from toenail avulsion.  Treat with doxycycline, Hibiclens, soaks, elevation.  Close podiatry follow-up recommended and information given for this.  Return for acutely worsening symptoms.  Final Clinical Impressions(s) / UC Diagnoses   Final diagnoses:  Toe infection  Avulsion of toenail, initial encounter     Discharge Instructions      [facility]) Address: [address] [place], [place],  [address] Phone: [phone]     ED Prescriptions     Medication Sig Dispense Auth. Provider   doxycycline (VIBRAMYCIN) 100 MG capsule Take 1 capsule (100 mg total) by mouth 2 (two) times daily. 14 capsule Volney American, [place]   chlorhexidine (HIBICLENS) 4 % external liquid Apply topically daily as needed. 120 mL Volney American, [place]      PDMP not reviewed this encounter.   Volney American, [place] 04/16/21 1147

## 2021-04-16 NOTE — ED Triage Notes (Signed)
Right toe wound x a couple of days (Tuesday). Unsure if he injured toe. Pus drainage noted from 3rd toe.  Redness and swelling.

## 2021-04-16 NOTE — Discharge Instructions (Signed)
[facility]) Address: [address] [place], [place], [place] [address] Phone: [phone]

## 2021-04-28 DIAGNOSIS — M79675 Pain in left toe(s): Secondary | ICD-10-CM | POA: Diagnosis not present

## 2021-04-28 DIAGNOSIS — M79671 Pain in right foot: Secondary | ICD-10-CM | POA: Diagnosis not present

## 2021-04-28 DIAGNOSIS — M79674 Pain in right toe(s): Secondary | ICD-10-CM | POA: Diagnosis not present

## 2021-04-28 DIAGNOSIS — M79672 Pain in left foot: Secondary | ICD-10-CM | POA: Diagnosis not present

## 2021-04-28 DIAGNOSIS — L110 Acquired keratosis follicularis: Secondary | ICD-10-CM | POA: Diagnosis not present

## 2021-04-28 DIAGNOSIS — I739 Peripheral vascular disease, unspecified: Secondary | ICD-10-CM | POA: Diagnosis not present

## 2021-05-24 DIAGNOSIS — J449 Chronic obstructive pulmonary disease, unspecified: Secondary | ICD-10-CM | POA: Diagnosis not present

## 2021-05-24 DIAGNOSIS — J209 Acute bronchitis, unspecified: Secondary | ICD-10-CM | POA: Diagnosis not present

## 2021-06-23 ENCOUNTER — Other Ambulatory Visit: Payer: Self-pay | Admitting: Gastroenterology

## 2021-06-23 NOTE — Telephone Encounter (Signed)
Patient hasn't been seen since June 2020. He needs an OV for additional refills. I am sending in a limited 3 month supply of pantoprazole. Please arrange follow-up and confirm with patient.

## 2021-06-24 NOTE — Telephone Encounter (Signed)
Spoke to pt informed him pantoprazole was sent to pharmacy and he needed to have [place]. Pt stated he can not walk right now and would call when he could.

## 2021-06-24 NOTE — Telephone Encounter (Signed)
He can schedule a virtual visit.

## 2021-06-25 NOTE — Telephone Encounter (Signed)
Noted  

## 2021-06-25 NOTE — Telephone Encounter (Signed)
Spoke to pt offered him a virtual visit. He stated he will call after first of year to set that up.

## 2021-07-20 ENCOUNTER — Encounter (HOSPITAL_COMMUNITY): Payer: Self-pay | Admitting: Emergency Medicine

## 2021-07-20 ENCOUNTER — Other Ambulatory Visit: Payer: Self-pay

## 2021-07-20 ENCOUNTER — Emergency Department (HOSPITAL_COMMUNITY): Payer: Medicare Other

## 2021-07-20 ENCOUNTER — Inpatient Hospital Stay (HOSPITAL_COMMUNITY)
Admission: EM | Admit: 2021-07-20 | Discharge: 2021-07-22 | DRG: 178 | Disposition: A | Discharge status: Home / Self Care | Payer: Medicare Other | Attending: Family Medicine | Admitting: Family Medicine

## 2021-07-20 DIAGNOSIS — E871 Hypo-osmolality and hyponatremia: Secondary | ICD-10-CM | POA: Diagnosis not present

## 2021-07-20 DIAGNOSIS — J449 Chronic obstructive pulmonary disease, unspecified: Secondary | ICD-10-CM | POA: Diagnosis not present

## 2021-07-20 DIAGNOSIS — E876 Hypokalemia: Secondary | ICD-10-CM | POA: Diagnosis not present

## 2021-07-20 DIAGNOSIS — R1319 Other dysphagia: Secondary | ICD-10-CM | POA: Diagnosis not present

## 2021-07-20 DIAGNOSIS — I2510 Atherosclerotic heart disease of native coronary artery without angina pectoris: Secondary | ICD-10-CM | POA: Diagnosis not present

## 2021-07-20 DIAGNOSIS — R0689 Other abnormalities of breathing: Secondary | ICD-10-CM | POA: Diagnosis not present

## 2021-07-20 DIAGNOSIS — Z79899 Other long term (current) drug therapy: Secondary | ICD-10-CM | POA: Diagnosis not present

## 2021-07-20 DIAGNOSIS — I739 Peripheral vascular disease, unspecified: Secondary | ICD-10-CM | POA: Diagnosis present

## 2021-07-20 DIAGNOSIS — Z7982 Long term (current) use of aspirin: Secondary | ICD-10-CM

## 2021-07-20 DIAGNOSIS — R1314 Dysphagia, pharyngoesophageal phase: Secondary | ICD-10-CM | POA: Diagnosis present

## 2021-07-20 DIAGNOSIS — Z20822 Contact with and (suspected) exposure to covid-19: Secondary | ICD-10-CM | POA: Diagnosis present

## 2021-07-20 DIAGNOSIS — R197 Diarrhea, unspecified: Secondary | ICD-10-CM | POA: Diagnosis not present

## 2021-07-20 DIAGNOSIS — Z880 Allergy status to penicillin: Secondary | ICD-10-CM | POA: Diagnosis not present

## 2021-07-20 DIAGNOSIS — K449 Diaphragmatic hernia without obstruction or gangrene: Secondary | ICD-10-CM | POA: Diagnosis not present

## 2021-07-20 DIAGNOSIS — R54 Age-related physical debility: Secondary | ICD-10-CM | POA: Diagnosis not present

## 2021-07-20 DIAGNOSIS — E860 Dehydration: Secondary | ICD-10-CM | POA: Diagnosis not present

## 2021-07-20 DIAGNOSIS — J690 Pneumonitis due to inhalation of food and vomit: Secondary | ICD-10-CM | POA: Diagnosis not present

## 2021-07-20 DIAGNOSIS — Z8249 Family history of ischemic heart disease and other diseases of the circulatory system: Secondary | ICD-10-CM

## 2021-07-20 DIAGNOSIS — K4090 Unilateral inguinal hernia, without obstruction or gangrene, not specified as recurrent: Secondary | ICD-10-CM | POA: Diagnosis not present

## 2021-07-20 DIAGNOSIS — R1310 Dysphagia, unspecified: Secondary | ICD-10-CM | POA: Diagnosis not present

## 2021-07-20 DIAGNOSIS — Z823 Family history of stroke: Secondary | ICD-10-CM | POA: Diagnosis not present

## 2021-07-20 DIAGNOSIS — Z743 Need for continuous supervision: Secondary | ICD-10-CM | POA: Diagnosis not present

## 2021-07-20 DIAGNOSIS — R6889 Other general symptoms and signs: Secondary | ICD-10-CM | POA: Diagnosis not present

## 2021-07-20 DIAGNOSIS — I10 Essential (primary) hypertension: Secondary | ICD-10-CM | POA: Diagnosis present

## 2021-07-20 DIAGNOSIS — T17908A Unspecified foreign body in respiratory tract, part unspecified causing other injury, initial encounter: Secondary | ICD-10-CM | POA: Diagnosis not present

## 2021-07-20 DIAGNOSIS — K219 Gastro-esophageal reflux disease without esophagitis: Secondary | ICD-10-CM | POA: Diagnosis present

## 2021-07-20 DIAGNOSIS — J929 Pleural plaque without asbestos: Secondary | ICD-10-CM | POA: Diagnosis not present

## 2021-07-20 DIAGNOSIS — R0602 Shortness of breath: Secondary | ICD-10-CM | POA: Diagnosis not present

## 2021-07-20 DIAGNOSIS — K862 Cyst of pancreas: Secondary | ICD-10-CM | POA: Diagnosis not present

## 2021-07-20 DIAGNOSIS — I499 Cardiac arrhythmia, unspecified: Secondary | ICD-10-CM | POA: Diagnosis not present

## 2021-07-20 DIAGNOSIS — R059 Cough, unspecified: Secondary | ICD-10-CM | POA: Diagnosis not present

## 2021-07-20 LAB — PROCALCITONIN: Procalcitonin: <0.1 ng/mL

## 2021-07-20 LAB — COMPREHENSIVE METABOLIC PANEL
ALT: 24 U/L (ref 0–44)
AST: 20 U/L (ref 15–41)
Albumin: 3 g/dL — ABNORMAL LOW (ref 3.5–5.0)
Alkaline Phosphatase: 97 U/L (ref 38–126)
Anion gap: 8 (ref 5–15)
BUN: 12 mg/dL (ref 8–23)
CO2: 24 mmol/L (ref 22–32)
Calcium: 9.1 mg/dL (ref 8.9–10.3)
Chloride: 98 mmol/L (ref 98–111)
Creatinine, Ser: 0.68 mg/dL (ref 0.61–1.24)
GFR, Estimated: >60 mL/min (ref >60)
Glucose, Bld: 122 mg/dL — ABNORMAL HIGH (ref 70–99)
Potassium: 4.2 mmol/L (ref 3.5–5.1)
Sodium: 130 mmol/L — ABNORMAL LOW (ref 135–145)
Total Bilirubin: 0.3 mg/dL (ref 0.3–1.2)
Total Protein: 6.8 g/dL (ref 6.5–8.1)

## 2021-07-20 LAB — CBC WITH DIFFERENTIAL/PLATELET
Abs Immature Granulocytes: 0.13 10*3/uL — ABNORMAL HIGH (ref 0.00–0.07)
Basophils Absolute: 0.1 10*3/uL (ref 0.0–0.1)
Basophils Relative: 0 %
Eosinophils Absolute: 0 10*3/uL (ref 0.0–0.5)
Eosinophils Relative: 0 %
HCT: 36.4 % — ABNORMAL LOW (ref 39.0–52.0)
Hemoglobin: 12.6 g/dL — ABNORMAL LOW (ref 13.0–17.0)
Immature Granulocytes: 1 %
Lymphocytes Relative: 9 %
Lymphs Abs: 2 10*3/uL (ref 0.7–4.0)
MCH: 33 pg (ref 26.0–34.0)
MCHC: 34.6 g/dL (ref 30.0–36.0)
MCV: 95.3 fL (ref 80.0–100.0)
Monocytes Absolute: 2.9 10*3/uL — ABNORMAL HIGH (ref 0.1–1.0)
Monocytes Relative: 13 %
Neutro Abs: 17.5 10*3/uL — ABNORMAL HIGH (ref 1.7–7.7)
Neutrophils Relative %: 77 %
Platelets: 322 10*3/uL (ref 150–400)
RBC: 3.82 MIL/uL — ABNORMAL LOW (ref 4.22–5.81)
RDW: 12.2 % (ref 11.5–15.5)
WBC: 22.6 10*3/uL — ABNORMAL HIGH (ref 4.0–10.5)
nRBC: 0 % (ref 0.0–0.2)

## 2021-07-20 LAB — RESP PANEL BY RT-PCR (FLU A&B, COVID) ARPGX2
Influenza A by PCR: NEGATIVE
Influenza B by PCR: NEGATIVE
SARS Coronavirus 2 by RT PCR: NEGATIVE

## 2021-07-20 LAB — LACTIC ACID, PLASMA: Lactic Acid, Venous: 1.4 mmol/L (ref 0.5–1.9)

## 2021-07-20 LAB — GLUCOSE, CAPILLARY: Glucose-Capillary: 115 mg/dL — ABNORMAL HIGH (ref 70–99)

## 2021-07-20 LAB — LIPASE, BLOOD: Lipase: 26 U/L (ref 11–51)

## 2021-07-20 MED ORDER — IPRATROPIUM-ALBUTEROL 0.5-2.5 (3) MG/3ML IN SOLN
3.0000 mL | Freq: Once | RESPIRATORY_TRACT | Status: AC
  Administered 2021-07-20: 3 mL via RESPIRATORY_TRACT

## 2021-07-20 MED ORDER — DEXTROSE-NACL 5-0.9 % IV SOLN: INTRAVENOUS | Status: AC

## 2021-07-20 MED ORDER — ACETAMINOPHEN 650 MG RE SUPP: 650.0000 mg | Freq: Four times a day (QID) | RECTAL | Status: DC | PRN

## 2021-07-20 MED ORDER — ENOXAPARIN SODIUM 40 MG/0.4ML IJ SOSY
40.0000 mg | PREFILLED_SYRINGE | INTRAMUSCULAR | Status: DC
  Administered 2021-07-20 – 2021-07-21 (×2): 40 mg via SUBCUTANEOUS
  Filled 2021-07-20 (×2): qty 0.4

## 2021-07-20 MED ORDER — ALBUTEROL SULFATE HFA 108 (90 BASE) MCG/ACT IN AERS: 1.0000 | INHALATION_SPRAY | Freq: Four times a day (QID) | RESPIRATORY_TRACT | Status: DC | PRN

## 2021-07-20 MED ORDER — SODIUM CHLORIDE 0.9 % IV SOLN
1.0000 g | INTRAVENOUS | Status: DC
  Administered 2021-07-21: 1 g via INTRAVENOUS
  Filled 2021-07-20: qty 10

## 2021-07-20 MED ORDER — SODIUM CHLORIDE 0.9 % IV SOLN
1.0000 g | Freq: Once | INTRAVENOUS | Status: AC
  Administered 2021-07-20: 1 g via INTRAVENOUS
  Filled 2021-07-20: qty 10

## 2021-07-20 MED ORDER — IOHEXOL 300 MG/ML  SOLN
100.0000 mL | Freq: Once | INTRAMUSCULAR | Status: AC | PRN
  Administered 2021-07-20: 100 mL via INTRAVENOUS

## 2021-07-20 MED ORDER — POLYETHYLENE GLYCOL 3350 17 G PO PACK: 17.0000 g | PACK | Freq: Every day | ORAL | Status: DC | PRN

## 2021-07-20 MED ORDER — ONDANSETRON HCL 4 MG/2ML IJ SOLN: 4.0000 mg | Freq: Four times a day (QID) | INTRAMUSCULAR | Status: DC | PRN

## 2021-07-20 MED ORDER — METRONIDAZOLE 500 MG/100ML IV SOLN
500.0000 mg | Freq: Two times a day (BID) | INTRAVENOUS | Status: DC
  Administered 2021-07-20 – 2021-07-22 (×4): 500 mg via INTRAVENOUS
  Filled 2021-07-20 (×4): qty 100

## 2021-07-20 MED ORDER — ACETAMINOPHEN 325 MG PO TABS: 650.0000 mg | ORAL_TABLET | Freq: Four times a day (QID) | ORAL | Status: DC | PRN

## 2021-07-20 MED ORDER — SODIUM CHLORIDE 0.9 % IV BOLUS
500.0000 mL | Freq: Once | INTRAVENOUS | Status: AC
  Administered 2021-07-20: 500 mL via INTRAVENOUS

## 2021-07-20 MED ORDER — ONDANSETRON HCL 4 MG PO TABS: 4.0000 mg | ORAL_TABLET | Freq: Four times a day (QID) | ORAL | Status: DC | PRN

## 2021-07-20 MED ORDER — LABETALOL HCL 5 MG/ML IV SOLN: 10.0000 mg | INTRAVENOUS | Status: DC | PRN

## 2021-07-20 NOTE — ED Triage Notes (Signed)
Pt arrived via [facility] with c/o diarrhea x 1 week. Upon Auscultation crackles. O2 89% upon standing per EMS.

## 2021-07-20 NOTE — H&P (Signed)
History and Physical    Peter [name] YBW:[id number] DOB: 01-01-1933 DOA: 07/20/2021  PCP: Redmond School, MD   Patient coming from: Home  I have personally briefly reviewed patient's old medical records in [place]  Chief Complaint: Cough, diarrhea  HPI: Peter [name] is a 88 y.o. male with medical history significant for COPD, hypertension, dysphagia, peripheral and coronary artery disease. Patient was brought to the ED via EMS reports of diarrhea of 1 week duration and weakness.  Patient has also had an ongoing productive cough for a while.  At time of my evaluation patient is awake alert oriented able to answer questions but daughter [name] is at bedside and assist with the history. Patient has been cough productive of thick phlegm since November.  He was evaluated by his outpatient provider and prescribed a course of Augmentin ( daughter says its amoxicillin) for presumed pneumonia which he had completed at least 4 weeks ago.  No difficulty breathing.  Cough is not particularly worse.  He has episodes of noisy breathing/wheezing.  He is has barely been mobile in the past 3 years.  Over the past week patient developed watery diarrhea, daughter reports multiple episodes every day of just pure water.  Patient has had swallowing problems for years and has had esophageal dilatation about 5 times.  He has been having difficulty swallowing which is chronic, and he is on a dysphagia diet that consist of very soft food and liquids.  No abdominal pain.  No vomiting.  ED Course: Temperature 98.9.  Heart rate 80s to 96.  Respiratory rate 19-25.  Blood pressure systolic 768T to 157W.  O2 sats 97 200% on room air.  COVID and influenza test negative.  Lactic acid 1.4.  Leukocytosis of 22.  X-ray without acute abnormality.  CT abdomen pelvis and chest with contrast-shows debris's in the trachea and airways with associated bronchial wall thickening suspicious for aspiration.,  Lungs are clear with  no focal consolidation or suspicious nodule. IV ceftriaxone and metronidazole given for aspiration pneumonia.  Hospitalist to admit.  Review of Systems: As per HPI all other systems reviewed and negative.  Past Medical History:  Diagnosis Date   Anxiety    Arthritis    BPH (benign prostatic hyperplasia)    Chronic pain    Complication of anesthesia    had an anxiety attack once   COPD (chronic obstructive pulmonary disease) (HCC)    Depression    GERD (gastroesophageal reflux disease)    Hiatal hernia    small   History of nuclear stress test    Nuc 5/19: soft tissue attenuation; no ischemia or scar, EF 72; Low Risk   HTN (hypertension)    Internal carotid artery stenosis, left    Pneumonia    Prostate disease    h/o elevated PSA, neg bx, [facility]   Sciatica    Wears glasses    Wears partial dentures     Past Surgical History:  Procedure Laterality Date   ABDOMINAL AORTOGRAM W/LOWER EXTREMITY N/A 10/26/2017   Procedure: ABDOMINAL AORTOGRAM W/LOWER EXTREMITY;  Surgeon: Waynetta Sandy, MD;  Location: [facility];  Service: Cardiovascular;  Laterality: N/A;  bilateral   BALLOON DILATION  04/23/2018   Procedure: BALLOON DILATION;  Surgeon: Daneil Dolin, MD;  [facility];  Service: Endoscopy;;  esophagus   BIOPSY  01/29/2014   Procedure: BIOPSY;  Surgeon: Daneil Dolin, MD;  [facility];  Service: Endoscopy;;  Gastric and Esophageal   BIOPSY  06/14/2018   Procedure: BIOPSY;  Surgeon: Daneil Dolin, MD;  [facility];  Service: Endoscopy;;  esophagus   CAROTID ENDARTERECTOMY Left 03/20/2017   CATARACT EXTRACTION W/PHACO  05/21/2012   Procedure: CATARACT EXTRACTION PHACO AND INTRAOCULAR LENS PLACEMENT ([place]);  Surgeon: Tonny Branch, MD;  Location: [facility];  Service: Ophthalmology;  Laterality: Right;  CDE 19.34   COLONOSCOPY  12/2004   diverticulosis, hyperplastic polyps, hemorrhoids   ENDARTERECTOMY Left 03/20/2017    Procedure: ENDARTERECTOMY CAROTID-LEFT;  Surgeon: Conrad Evergreen, MD;  Location: [facility];  Service: Vascular;  Laterality: Left;   ESOPHAGOGASTRODUODENOSCOPY  2003   schatzki ring, small vascular abnormality of duodenal bulb ?Dieulafoy's lesion (ablated)   ESOPHAGOGASTRODUODENOSCOPY  11/10/10   Dr. Gala Romney dilation 2 rings with 48F   ESOPHAGOGASTRODUODENOSCOPY N/A 01/29/2014   Dr.Rourk- multiple esophageal rings/webs- s/p dialation. small hiatal hernia. abnormal gastric mucosa. bx= mild chronic inactive gastritis (oxyntic mucosa), squamous mucosa with epithelial changes consistent with reflux related injury.   ESOPHAGOGASTRODUODENOSCOPY (EGD) WITH PROPOFOL N/A 04/23/2018   Procedure: ESOPHAGOGASTRODUODENOSCOPY (EGD) WITH PROPOFOL;  Surgeon: Daneil Dolin, MD;  [facility];  Service: Endoscopy;  Laterality: N/A;  2:00pm   ESOPHAGOGASTRODUODENOSCOPY (EGD) WITH PROPOFOL N/A 06/14/2018   Dr. Gala Romney: Obstructing Schatzki ring, status post dilation medium-sized hiatal hernia, biopsies from the mid to distal esophagus obtained which were benign.   MALONEY DILATION N/A 06/14/2018   Procedure: Venia Minks DILATION;  Surgeon: Daneil Dolin, MD;  [facility];  Service: Endoscopy;  Laterality: N/A;   MULTIPLE TOOTH EXTRACTIONS     PATCH ANGIOPLASTY Left 03/20/2017   PATCH ANGIOPLASTY Left 03/20/2017   Procedure: PATCH ANGIOPLASTY;  Surgeon: Conrad Belvue, MD;  Location: [facility];  Service: Vascular;  Laterality: Left;     reports that he has been smoking cigarettes. He has a 60.00 pack-year smoking history. He has never used smokeless tobacco. He reports that he does not drink alcohol and does not use drugs.  Allergies  Allergen Reactions   Penicillins Rash    Has patient had a PCN reaction causing immediate rash, facial/tongue/throat swelling, SOB or lightheadedness with hypotension: Yes Has patient had a PCN reaction causing severe rash involving mucus membranes or skin necrosis: No Has  patient had a PCN reaction that required hospitalization: No Has patient had a PCN reaction occurring within the last 10 years: No If all of the above answers are "NO", then may proceed with Cephalosporin use.     Family History  Problem Relation Age of Onset   Stroke Father 7       deceased   Heart attack Mother    Colon cancer Neg Hx    Prior to Admission medications   Medication Sig Start Date End Date Taking? Authorizing Provider  albuterol (VENTOLIN HFA) 108 (90 Base) MCG/ACT inhaler Inhale 1-2 puffs into the lungs every 6 (six) hours as needed for wheezing or shortness of breath. 06/17/19  Yes Shah, Pratik D, DO  ALPRAZolam Duanne Moron) 0.5 MG tablet Take 0.5 mg by mouth 2 (two) times daily as needed for anxiety.   Yes [provider]  amLODipine (NORVASC) 5 MG tablet Take 5 mg by mouth daily.  11/28/16  Yes [provider]  aspirin EC 81 MG tablet Take 81 mg by mouth daily.    Yes [provider]  finasteride (PROSCAR) 5 MG tablet Take 1 tablet (5 mg total) by mouth daily after lunch. 03/11/21  Yes Franchot Gallo, MD  lisinopril (PRINIVIL,ZESTRIL) 40 MG tablet Take 40 mg by mouth daily.  12/08/16  Yes [provider]  pantoprazole (PROTONIX) 40 MG tablet TAKE (1) TABLET BY MOUTH ONCE DAILY BEFORE BREAKFAST. Patient taking differently: Take 40 mg by mouth daily. 06/23/21  Yes Erenest Rasher, PA-C  azithromycin (ZITHROMAX) 250 MG tablet Take 1 tablet (250 mg total) by mouth daily. Take first 2 tablets together, then 1 every day until finished. Patient not taking: Reported on 07/20/2021 10/20/19   Emerson Monte, FNP  chlorhexidine (HIBICLENS) 4 % external liquid Apply topically daily as needed. Patient not taking: Reported on 07/20/2021 04/16/21   Volney American, PA-C  ciprofloxacin-dexamethasone [facility]) OTIC suspension Place 4 drops into the left ear 2 (two) times daily. Patient not taking: Reported on 07/20/2021 10/20/19   Emerson Monte, FNP  doxycycline (VIBRAMYCIN) 100 MG capsule Take 1 capsule (100 mg total) by mouth 2 (two) times daily. Patient not taking: Reported on 07/20/2021 04/16/21   Volney American, PA-C  fluticasone [facility]) 50 MCG/ACT nasal spray Place 1 spray into both nostrils daily for 14 days. Patient not taking: Reported on 07/20/2021 10/20/19 11/03/19  Emerson Monte, FNP  lactulose (CHRONULAC) 10 GM/15ML solution TAKE TWO TABLESPOONFULS AT BEDTIME AS NEEDED. Patient not taking: Reported on 07/20/2021 10/04/18   Mahala Menghini, PA-C  mometasone-formoterol (DULERA) 200-5 MCG/ACT AERO Inhale 1 puff into the lungs 2 (two) times daily. Patient not taking: Reported on 07/20/2021 06/17/19   Heath Lark D, DO    Physical Exam: Vitals:   07/20/21 1400 07/20/21 1430 07/20/21 1500 07/20/21 1530  BP: (!) 154/64 (!) 168/61 (!) 151/49 (!) 167/71  Pulse: 85 82 88 88  Resp: 18 (!) 21 20 20   Temp:      TempSrc:      SpO2: 98% 100% 98% 97%  Weight:      Height:        Constitutional: calm, comfortable Vitals:   07/20/21 1400 07/20/21 1430 07/20/21 1500 07/20/21 1530  BP: (!) 154/64 (!) 168/61 (!) 151/49 (!) 167/71  Pulse: 85 82 88 88  Resp: 18 (!) 21 20 20   Temp:      TempSrc:      SpO2: 98% 100% 98% 97%  Weight:      Height:       Eyes: PERRL, lids and conjunctivae normal ENMT: Mucous membranes are dry.  Neck: normal, supple, no masses, no thyromegaly Respiratory: Congested cough, with transmitted upper airway sounds, no wheezing or crackles. Normal respiratory effort. No accessory muscle use.  Cardiovascular: Regular rate and rhythm, no murmurs / rubs / gallops. No extremity edema.  Lower extremities warm.   Abdomen: no tenderness, no masses palpated. No hepatosplenomegaly. Bowel sounds positive.  Musculoskeletal: no clubbing / cyanosis. No joint deformity upper and lower extremities. Good ROM, no contractures. Normal muscle tone.  Skin: no rashes, lesions, ulcers. No induration Neurologic: No  apparent cranial nerve abnormality, moving extremities spontaneously.  Psychiatric: Normal judgment and insight. Alert and oriented x 3. Normal mood.   Labs on Admission: I have personally reviewed following labs and imaging studies  CBC: Recent Labs  Lab 07/20/21 1047  WBC 22.6*  NEUTROABS 17.5*  HGB 12.6*  HCT 36.4*  MCV 95.3  PLT 062   Basic Metabolic Panel: Recent Labs  Lab 07/20/21 1047  NA 130*  K 4.2  CL 98  CO2 24  GLUCOSE 122*  BUN 12  CREATININE 0.68  CALCIUM  9.1   GFR: Estimated Creatinine Clearance: 65.1 mL/min (by C-G formula based on SCr of 0.68 mg/dL). Liver Function Tests: Recent Labs  Lab 07/20/21 1047  AST 20  ALT 24  ALKPHOS 97  BILITOT 0.3  PROT 6.8  ALBUMIN 3.0*   Recent Labs  Lab 07/20/21 1047  LIPASE 26   Radiological Exams on Admission: CT CHEST ABDOMEN PELVIS W CONTRAST  Result Date: 07/20/2021 CLINICAL DATA:  Cough, shortness of breath, diarrhea. Nodular opacity seen on prior chest x-ray EXAM: CT CHEST, ABDOMEN, AND PELVIS WITH CONTRAST TECHNIQUE: Multidetector CT imaging of the chest, abdomen and pelvis was performed following the standard protocol during bolus administration of intravenous contrast. RADIATION DOSE REDUCTION: This exam was performed according to the departmental dose-optimization program which includes automated exposure control, adjustment of the mA and/or kV according to patient size and/or use of iterative reconstruction technique. CONTRAST:  148mL OMNIPAQUE IOHEXOL 300 MG/ML  SOLN COMPARISON:  Same-day chest radiograph, lumbar spine CT 09/21/2012 FINDINGS: CT CHEST FINDINGS Cardiovascular: The heart size is normal. There is no pericardial effusion. Aortic valve, mitral annular, and coronary artery calcifications are noted. There is extensive calcified atherosclerotic plaque throughout the thoracic aorta. Mediastinum/Nodes: The thyroid is unremarkable. The esophagus is grossly unremarkable. There is no mediastinal,  hilar, or axillary lymphadenopathy. Lungs/Pleura: The trachea and central airways are patent. There is debris in the distal trachea and left main bronchus and smaller distal airways. There is central bronchial wall thickening with areas of mucoid impaction. There is no focal consolidation or pulmonary edema. There is no pleural effusion or pneumothorax. Minimal linear opacities in the left base likely reflect subsegmental atelectasis. There are no suspicious nodules. The finding on the prior chest radiograph was likely artifactual. Musculoskeletal: There is no acute osseous abnormality or aggressive osseous lesion. There is multilevel degenerative change of the thoracic spine with flowing anterior osteophytes across the lower thoracic/upper lumbar vertebral bodies consistent with diffuse idiopathic skeletal hyperostosis. CT ABDOMEN PELVIS FINDINGS Hepatobiliary: The liver is unremarkable. No focal lesions are seen. The gallbladder is distended but otherwise unremarkable, without evidence of acute cholecystitis. There is no biliary ductal dilatation. Pancreas: A subcentimeter cystic lesion in the pancreatic tail may reflect an intraductal papillary mucinous neoplasm (2-62). The pancreas is otherwise unremarkable. There are no other focal lesions. There is no main pancreatic ductal dilatation or peripancreatic inflammatory change. Spleen: Unremarkable. Adrenals/Urinary Tract: Adrenals are unremarkable. Multiple renal cysts are noted bilaterally, with 1 on the left demonstrating a thin internal septation. There are no solid or suspicious lesions. Calcifications in the kidneys are favored to reflect vascular calcifications as opposed to renal stones. There is bilateral perinephric stranding, nonspecific. There is no hydronephrosis or hydroureter. The bladder is distended. There is a small right posterior bladder diverticulum likely reflecting chronic outlet obstruction (2-107). Stomach/Bowel: There is a small hiatal  hernia. The stomach is otherwise unremarkable, allowing for the degree of decompression. There is no evidence of bowel obstruction. There is extensive sigmoid diverticulosis without evidence of acute diverticulitis. A loop of small bowel is herniated into the right inguinal canal without evidence of obstruction or strangulation. The appendix is normal. Vascular/Lymphatic: There are extensive vascular calcifications throughout the abdomen and pelvis likely resulting in high-grade stenoses of the celiac artery, SMA, bilateral renal arteries, and bilateral iliofemoral vessels. The main portal and splenic veins are patent. There is no abdominal or pelvic lymphadenopathy. Reproductive: Prostate is enlarged measuring up to 5.8 cm transverse. The prostate impresses upon the inferior aspect of  the bladder. Other: There is no ascites or free air. Is a bowel containing right inguinal hernia, as above. Musculoskeletal: There is no acute osseous abnormality or aggressive osseous lesion. There are multilevel degenerative changes throughout the lumbar spine. IMPRESSION: 1. Debris in the trachea and airways with associated bronchial wall thickening suspicious for aspiration. 2. Otherwise, clear lungs with no focal consolidation or suspicious nodule. The finding on the prior radiograph was likely artifactual. 3. No acute findings in the abdomen or pelvis. 4. Right inguinal hernia containing a loop of small bowel without evidence of obstruction or incarceration. 5. Enlarged prostate which impresses upon the inferior aspect of the bladder. A small right posterior bladder diverticulum likely reflects chronic outlet obstruction. 6. Diverticulosis without evidence of acute diverticulitis. 7. Subcentimeter cystic lesion in the pancreatic tail may reflect an intraductal papillary mucinous neoplasm. 8. Multilevel degenerative change of the spine with diffuse idiopathic skeletal hyperostosis. 9. Extensive vascular calcifications throughout  the thoracoabdominal aorta and major branch vessels likely resulting in high-grade stenoses throughout the abdominal/pelvic vasculature. 10. Aortic valve, mitral annular, and coronary artery calcifications. Aortic Atherosclerosis (ICD10-I70.0). Electronically Signed   By: Valetta Mole M.D.   On: 07/20/2021 15:26   DG Chest Portable 1 View  Result Date: 07/20/2021 CLINICAL DATA:  Cough, shortness of breath EXAM: PORTABLE CHEST 1 VIEW COMPARISON:  Chest radiograph 10/20/2019 FINDINGS: The cardiomediastinal silhouette is normal There is no focal consolidation or pulmonary edema. There is a 1.2 cm nodular opacity projecting over the right lower lobe not seen on the prior study. The right costophrenic angle is partially excluded, but there is no significant right pleural effusion. There is no left effusion. There is no pneumothorax. There is no acute osseous abnormality. IMPRESSION: 1. 1.2 cm nodular opacity projecting over the right lower lobe. Recommend nonemergent CT chest for further evaluation. 2. No radiographic evidence of acute cardiopulmonary process. Electronically Signed   By: Valetta Mole M.D.   On: 07/20/2021 10:50    EKG: Independently reviewed.  Sinus rhythm rate 92, QTc 452.  No significant change from prior.  Assessment/Plan Principal Problem:   Diarrhea Active Problems:   Aspiration into respiratory tract   Esophageal dysphagia   PAD (peripheral artery disease) (HCC)   Essential hypertension   Diarrhea-multiple episodes of watery stools X 1 week.  No vomiting, abdominal exam benign.  Abdominal CT negative for pathology that would explain diarrhea.  Completed a course of antibiotics for pneumonia about a month ago.  -Obtain stool C. Difficile, GI stool pathogen panel - Appears dehydrated 500 mls given , continue D5 N/s 75cc/hr x  1 day  Aspiration secondary to esophageal dysphagia-  productive cough, leukocytosis of 22, otherwise rules out for sepsis.  CT chest showing debris's in  the trachea and airways with associated bronchial wall thickening suspicious for aspiration, but lung fields are clear.  -  At this time no other etiology for leukocytosis identified. -Started on IV ceftriaxone and metronidazole in the ED, continue for now, low threshold to discontinue pending clinical course, considering clear lungs -NPO - SLP evaluation -Check procalcitonin -CBC in a.m.  Hyponatremia-130, chronic. -Hydrate  Peripheral artery disease  Hypertension-elevated -Remain n.p.o. -Hold Norvasc 5, lisinopril 40, -IV labetalol 10 mg for systolic greater than 109   DVT prophylaxis: Lovenox Code Status: Full code-confirmed with daughter and patient at bedside. Family Communication: Daughter [name] is HCPOA Disposition Plan: ~ 2 days Consults called: None Admission status: obs tele   Kyon Bentler Arlyce Dice MD [facility]  07/20/2021, 7:05 PM

## 2021-07-20 NOTE — ED Provider Notes (Signed)
[facility] EMERGENCY DEPARTMENT Provider Note   CSN: [id number] Arrival date & time: 07/20/21  0946     History  Chief Complaint  Patient presents with   Diarrhea    Peter [name] is a 88 y.o. male.   Diarrhea Associated symptoms: no abdominal pain, no arthralgias, no chills, no fever, no headaches, no myalgias and no vomiting       [name] is a 88 y.o. male past medical history of esophageal dysphagia, peripheral artery disease, hypertension, GERD and COPD who presents to the Emergency Department complaining of persistent diarrhea x1 week.  Patient also noted to have coarse lung sounds and O2 sat at home at 89% on room air.  Patient does not have a supplemental oxygen requirement at baseline.  Patient's daughter who is also at bedside provides most of history.  She states that he is mostly on liquid diet secondary to his dysphagia.  He was seen by PCP 4 to 5 weeks ago and started on Augmentin for likely pneumonia.  Daughter states that he completed the antibiotics as directed.  Patient endorses having some intermittent right lower abdominal pain, shortness of breath and generalized weakness as well.  No vomiting, fever or chills.  He states the stools are "mostly water."  He denies any bloody or black stools.  Taking Imodium without relief.  No chest pain   Home Medications Prior to Admission medications   Medication Sig Start Date End Date Taking? Authorizing Provider  albuterol (VENTOLIN HFA) 108 (90 Base) MCG/ACT inhaler Inhale 1-2 puffs into the lungs every 6 (six) hours as needed for wheezing or shortness of breath. 06/17/19   Manuella Ghazi, Pratik D, DO  ALPRAZolam Duanne Moron) 0.5 MG tablet Take 0.5 mg by mouth at bedtime.     [provider]  amLODipine (NORVASC) 5 MG tablet Take 5 mg by mouth daily.  11/28/16   [provider]  aspirin EC 81 MG tablet Take 81 mg by mouth daily.     [provider]  azithromycin (ZITHROMAX) 250 MG tablet Take 1 tablet  (250 mg total) by mouth daily. Take first 2 tablets together, then 1 every day until finished. 10/20/19   Avegno, Darrelyn Hillock, FNP  chlorhexidine (HIBICLENS) 4 % external liquid Apply topically daily as needed. 04/16/21   Volney American, PA-C  ciprofloxacin-dexamethasone (CIPRODEX) OTIC suspension Place 4 drops into the left ear 2 (two) times daily. 10/20/19   Avegno, Darrelyn Hillock, FNP  doxycycline (VIBRAMYCIN) 100 MG capsule Take 1 capsule (100 mg total) by mouth 2 (two) times daily. 04/16/21   Volney American, PA-C  finasteride (PROSCAR) 5 MG tablet Take 1 tablet (5 mg total) by mouth daily after lunch. 03/11/21   Franchot Gallo, MD  FLUoxetine (PROZAC) 10 MG capsule Take 10 mg by mouth daily. 06/10/19   [provider]  fluticasone (FLONASE) 50 MCG/ACT nasal spray Place 1 spray into both nostrils daily for 14 days. 10/20/19 11/03/19  Avegno, Darrelyn Hillock, FNP  lactulose (CHRONULAC) 10 GM/15ML solution TAKE TWO TABLESPOONFULS AT BEDTIME AS NEEDED. Patient taking differently: Take 20 g by mouth at bedtime as needed for mild constipation. TAKE TWO TABLESPOONFULS AT BEDTIME AS NEEDED. 10/04/18   Mahala Menghini, PA-C  lisinopril (PRINIVIL,ZESTRIL) 40 MG tablet Take 40 mg by mouth daily.  12/08/16   [provider]  mometasone-formoterol (DULERA) 200-5 MCG/ACT AERO Inhale 1 puff into the lungs 2 (two) times daily. 06/17/19   Manuella Ghazi, Pratik D, DO  pantoprazole ([place])  40 MG tablet TAKE (1) TABLET BY MOUTH ONCE DAILY BEFORE BREAKFAST. 06/23/21   Erenest Rasher, PA-C      Allergies    Penicillins    Review of Systems   Review of Systems  Constitutional:  Negative for appetite change, chills and fever.  HENT:  Negative for congestion.   Eyes:  Positive for visual disturbance.  Respiratory:  Positive for shortness of breath.   Cardiovascular:  Negative for chest pain.  Gastrointestinal:  Positive for diarrhea. Negative for abdominal pain, blood in stool, nausea and vomiting.   Genitourinary:  Negative for difficulty urinating.  Musculoskeletal:  Negative for arthralgias and myalgias.  Neurological:  Positive for weakness. Negative for dizziness and headaches.  Psychiatric/Behavioral:  Negative for confusion.    Physical Exam Updated Vital Signs Pulse 96    Temp 98.9 F (37.2 C) (Oral)    Resp (!) 25    Ht 5\' 10"  (1.778 m)    Wt 69.4 kg    SpO2 100%    BMI 21.95 kg/m  Physical Exam Vitals and nursing note reviewed.  Constitutional:      Appearance: He is ill-appearing.     Comments: Frail elderly appearing  HENT:     Mouth/Throat:     Mouth: Mucous membranes are dry.  Cardiovascular:     Rate and Rhythm: Normal rate and regular rhythm.     Pulses: Normal pulses.  Pulmonary:     Breath sounds: Rhonchi and rales present. No wheezing.     Comments: Patient on 2 L oxygen by nasal cannula during my exam.  Coarse lung sounds bilaterally no wheezing.  Slightly increased work of breathing on my exam Abdominal:     General: There is no distension.     Palpations: Abdomen is soft.     Tenderness: There is no abdominal tenderness. There is no right CVA tenderness, left CVA tenderness or guarding.  Musculoskeletal:        General: Normal range of motion.     Cervical back: Normal range of motion. No tenderness.     Right lower leg: No edema.     Left lower leg: No edema.  Lymphadenopathy:     Cervical: No cervical adenopathy.  Skin:    General: Skin is warm.     Capillary Refill: Capillary refill takes less than 2 seconds.  Neurological:     General: No focal deficit present.     Mental Status: He is alert.     Sensory: No sensory deficit.     Motor: No weakness.    ED Results / Procedures / Treatments   Labs (all labs ordered are listed, but only abnormal results are displayed) Labs Reviewed  CBC WITH DIFFERENTIAL/PLATELET - Abnormal; Notable for the following components:      Result Value   WBC 22.6 (*)    RBC 3.82 (*)    Hemoglobin 12.6 (*)     HCT 36.4 (*)    Neutro Abs 17.5 (*)    Monocytes Absolute 2.9 (*)    Abs Immature Granulocytes 0.13 (*)    All other components within normal limits  COMPREHENSIVE METABOLIC PANEL - Abnormal; Notable for the following components:   Sodium 130 (*)    Glucose, Bld 122 (*)    Albumin 3.0 (*)    All other components within normal limits  RESP PANEL BY RT-PCR (FLU A&B, COVID) ARPGX2  C DIFFICILE QUICK SCREEN W PCR REFLEX    GASTROINTESTINAL PANEL BY PCR, STOOL (  REPLACES STOOL CULTURE)  LIPASE, BLOOD  LACTIC ACID, PLASMA  PROCALCITONIN  BASIC METABOLIC PANEL  CBC    EKG None  Radiology CT CHEST ABDOMEN PELVIS W CONTRAST  Result Date: 07/20/2021 CLINICAL DATA:  Cough, shortness of breath, diarrhea. Nodular opacity seen on prior chest x-ray EXAM: CT CHEST, ABDOMEN, AND PELVIS WITH CONTRAST TECHNIQUE: Multidetector CT imaging of the chest, abdomen and pelvis was performed following the standard protocol during bolus administration of intravenous contrast. RADIATION DOSE REDUCTION: This exam was performed according to the departmental dose-optimization program which includes automated exposure control, adjustment of the mA and/or kV according to patient size and/or use of iterative reconstruction technique. CONTRAST:  175mL OMNIPAQUE IOHEXOL 300 MG/ML  SOLN COMPARISON:  Same-day chest radiograph, lumbar spine CT 09/21/2012 FINDINGS: CT CHEST FINDINGS Cardiovascular: The heart size is normal. There is no pericardial effusion. Aortic valve, mitral annular, and coronary artery calcifications are noted. There is extensive calcified atherosclerotic plaque throughout the thoracic aorta. Mediastinum/Nodes: The thyroid is unremarkable. The esophagus is grossly unremarkable. There is no mediastinal, hilar, or axillary lymphadenopathy. Lungs/Pleura: The trachea and central airways are patent. There is debris in the distal trachea and left main bronchus and smaller distal airways. There is central bronchial  wall thickening with areas of mucoid impaction. There is no focal consolidation or pulmonary edema. There is no pleural effusion or pneumothorax. Minimal linear opacities in the left base likely reflect subsegmental atelectasis. There are no suspicious nodules. The finding on the prior chest radiograph was likely artifactual. Musculoskeletal: There is no acute osseous abnormality or aggressive osseous lesion. There is multilevel degenerative change of the thoracic spine with flowing anterior osteophytes across the lower thoracic/upper lumbar vertebral bodies consistent with diffuse idiopathic skeletal hyperostosis. CT ABDOMEN PELVIS FINDINGS Hepatobiliary: The liver is unremarkable. No focal lesions are seen. The gallbladder is distended but otherwise unremarkable, without evidence of acute cholecystitis. There is no biliary ductal dilatation. Pancreas: A subcentimeter cystic lesion in the pancreatic tail may reflect an intraductal papillary mucinous neoplasm (2-62). The pancreas is otherwise unremarkable. There are no other focal lesions. There is no main pancreatic ductal dilatation or peripancreatic inflammatory change. Spleen: Unremarkable. Adrenals/Urinary Tract: Adrenals are unremarkable. Multiple renal cysts are noted bilaterally, with 1 on the left demonstrating a thin internal septation. There are no solid or suspicious lesions. Calcifications in the kidneys are favored to reflect vascular calcifications as opposed to renal stones. There is bilateral perinephric stranding, nonspecific. There is no hydronephrosis or hydroureter. The bladder is distended. There is a small right posterior bladder diverticulum likely reflecting chronic outlet obstruction (2-107). Stomach/Bowel: There is a small hiatal hernia. The stomach is otherwise unremarkable, allowing for the degree of decompression. There is no evidence of bowel obstruction. There is extensive sigmoid diverticulosis without evidence of acute  diverticulitis. A loop of small bowel is herniated into the right inguinal canal without evidence of obstruction or strangulation. The appendix is normal. Vascular/Lymphatic: There are extensive vascular calcifications throughout the abdomen and pelvis likely resulting in high-grade stenoses of the celiac artery, SMA, bilateral renal arteries, and bilateral iliofemoral vessels. The main portal and splenic veins are patent. There is no abdominal or pelvic lymphadenopathy. Reproductive: Prostate is enlarged measuring up to 5.8 cm transverse. The prostate impresses upon the inferior aspect of the bladder. Other: There is no ascites or free air. Is a bowel containing right inguinal hernia, as above. Musculoskeletal: There is no acute osseous abnormality or aggressive osseous lesion. There are multilevel degenerative changes throughout the  lumbar spine. IMPRESSION: 1. Debris in the trachea and airways with associated bronchial wall thickening suspicious for aspiration. 2. Otherwise, clear lungs with no focal consolidation or suspicious nodule. The finding on the prior radiograph was likely artifactual. 3. No acute findings in the abdomen or pelvis. 4. Right inguinal hernia containing a loop of small bowel without evidence of obstruction or incarceration. 5. Enlarged prostate which impresses upon the inferior aspect of the bladder. A small right posterior bladder diverticulum likely reflects chronic outlet obstruction. 6. Diverticulosis without evidence of acute diverticulitis. 7. Subcentimeter cystic lesion in the pancreatic tail may reflect an intraductal papillary mucinous neoplasm. 8. Multilevel degenerative change of the spine with diffuse idiopathic skeletal hyperostosis. 9. Extensive vascular calcifications throughout the thoracoabdominal aorta and major branch vessels likely resulting in high-grade stenoses throughout the abdominal/pelvic vasculature. 10. Aortic valve, mitral annular, and coronary artery  calcifications. Aortic Atherosclerosis (ICD10-I70.0). Electronically Signed   By: Valetta Mole M.D.   On: 07/20/2021 15:26   DG Chest Portable 1 View  Result Date: 07/20/2021 CLINICAL DATA:  Cough, shortness of breath EXAM: PORTABLE CHEST 1 VIEW COMPARISON:  Chest radiograph 10/20/2019 FINDINGS: The cardiomediastinal silhouette is normal There is no focal consolidation or pulmonary edema. There is a 1.2 cm nodular opacity projecting over the right lower lobe not seen on the prior study. The right costophrenic angle is partially excluded, but there is no significant right pleural effusion. There is no left effusion. There is no pneumothorax. There is no acute osseous abnormality. IMPRESSION: 1. 1.2 cm nodular opacity projecting over the right lower lobe. Recommend nonemergent CT chest for further evaluation. 2. No radiographic evidence of acute cardiopulmonary process. Electronically Signed   By: Valetta Mole M.D.   On: 07/20/2021 10:50    Procedures Procedures    Medications Ordered in ED Medications - No data to display  ED Course/ Medical Decision Making/ A&P                           Medical Decision Making  Patient here with 1 week history of persistent diarrhea.  Taking Imodium without relief.  He was started on Augmentin for presumptive pneumonia 4 to 5 weeks ago.  No additional antibiotics since that time.  No vomiting or fever.  Patient also complains of some shortness of breath without chest pain.  No supplemental oxygen requirement at baseline.  On my exam, patient is frail and elderly appearing.  He has some increased work of breathing.  Lung sounds are rhonchorous bilaterally.  He is requiring 2 L oxygen by nasal cannula.  He is maintaining O2 sats above 90% on 2 L.  Per EMS, patient had O2 sat of 89% at home on room air.  Benign appearing abdominal exam   Patient's daughter who is at bedside provides most the history.  He has history of esophageal dysphagia and is on a mostly  liquid diet.  He is ill-appearing.  Differential at this time would include pneumonia, COPD exacerbation, source of his diarrhea could include viral process, C. difficile, parasitic infection.  Plan includes obtaining stool sample for GI panel, C. difficile for testing, will also obtain additional labs, and imaging of the chest and abdomen pelvis.  He does appear critically ill, low clinical suspicion for sepsis.  He will likely need hospital admission.  He is agreeable to this plan.  On recheck, labs interpreted by me show leukocytosis of 22,000, electrolytes mild hyponatremia, LFTs are unremarkable, lipase  also unremarkable.  Lactic acid 1.4, COVID and influenza testing negative.  Patient has been unable to provide stool sample during ER stay.  Chest x-ray shows 1.2 cm nodular opacity over the right lower lobe.  Recommend nonemergent CT chest . source of patient's leukocytosis unclear at this time.  We will proceed with CT imaging of the abdomen and pelvis.  Since CT chest was also recommended, will scan his chest as well.  CT chest shows debris in the trachea with associated bronchial wall thickening that suspicious for aspiration.  No acute findings of the abdomen or pelvis.  Will start patient on antibiotics.  He will require hospital admission.  He is agreeable to this plan.  Discussed findings with [facility] hospitalist, Dr. Denton Brick who is agreeable to admit.        Final Clinical Impression(s) / ED Diagnoses Final diagnoses:  Diarrhea, unspecified type  Aspiration pneumonia, unspecified aspiration pneumonia type, unspecified laterality, unspecified part of lung [facility])    Rx / DC Orders ED Discharge Orders     None         Kem Parkinson, PA-C 07/20/21 1942    Kommor, Debe Coder, MD 07/23/21 [phone]

## 2021-07-20 NOTE — Progress Notes (Addendum)
Pt arrived to the floor at 1745 via wheelchair by ED staff. Pt was able to stand and pivot with assistance to the bed. Advised pt to call for assistance when he has to go to the restroom and don't attempt to get up on his own. Bed was placed in lowest position with bed alarm on. Call light and bedside table within pt reach.

## 2021-07-21 DIAGNOSIS — R1314 Dysphagia, pharyngoesophageal phase: Secondary | ICD-10-CM | POA: Diagnosis present

## 2021-07-21 DIAGNOSIS — I10 Essential (primary) hypertension: Secondary | ICD-10-CM

## 2021-07-21 DIAGNOSIS — E871 Hypo-osmolality and hyponatremia: Secondary | ICD-10-CM | POA: Diagnosis present

## 2021-07-21 DIAGNOSIS — R1319 Other dysphagia: Secondary | ICD-10-CM | POA: Diagnosis not present

## 2021-07-21 DIAGNOSIS — T17908A Unspecified foreign body in respiratory tract, part unspecified causing other injury, initial encounter: Secondary | ICD-10-CM | POA: Diagnosis not present

## 2021-07-21 DIAGNOSIS — R197 Diarrhea, unspecified: Secondary | ICD-10-CM | POA: Diagnosis not present

## 2021-07-21 DIAGNOSIS — J449 Chronic obstructive pulmonary disease, unspecified: Secondary | ICD-10-CM | POA: Diagnosis present

## 2021-07-21 DIAGNOSIS — I739 Peripheral vascular disease, unspecified: Secondary | ICD-10-CM | POA: Diagnosis not present

## 2021-07-21 DIAGNOSIS — R54 Age-related physical debility: Secondary | ICD-10-CM | POA: Diagnosis present

## 2021-07-21 DIAGNOSIS — Z7982 Long term (current) use of aspirin: Secondary | ICD-10-CM | POA: Diagnosis not present

## 2021-07-21 DIAGNOSIS — Z823 Family history of stroke: Secondary | ICD-10-CM | POA: Diagnosis not present

## 2021-07-21 DIAGNOSIS — J690 Pneumonitis due to inhalation of food and vomit: Secondary | ICD-10-CM | POA: Diagnosis present

## 2021-07-21 DIAGNOSIS — K219 Gastro-esophageal reflux disease without esophagitis: Secondary | ICD-10-CM | POA: Diagnosis present

## 2021-07-21 DIAGNOSIS — Z20822 Contact with and (suspected) exposure to covid-19: Secondary | ICD-10-CM | POA: Diagnosis present

## 2021-07-21 DIAGNOSIS — I2510 Atherosclerotic heart disease of native coronary artery without angina pectoris: Secondary | ICD-10-CM | POA: Diagnosis present

## 2021-07-21 DIAGNOSIS — Z8249 Family history of ischemic heart disease and other diseases of the circulatory system: Secondary | ICD-10-CM | POA: Diagnosis not present

## 2021-07-21 DIAGNOSIS — E876 Hypokalemia: Secondary | ICD-10-CM | POA: Diagnosis present

## 2021-07-21 DIAGNOSIS — Z880 Allergy status to penicillin: Secondary | ICD-10-CM | POA: Diagnosis not present

## 2021-07-21 DIAGNOSIS — Z79899 Other long term (current) drug therapy: Secondary | ICD-10-CM | POA: Diagnosis not present

## 2021-07-21 DIAGNOSIS — E860 Dehydration: Secondary | ICD-10-CM | POA: Diagnosis present

## 2021-07-21 LAB — BASIC METABOLIC PANEL
Anion gap: 7 (ref 5–15)
BUN: 11 mg/dL (ref 8–23)
CO2: 23 mmol/L (ref 22–32)
Calcium: 9.1 mg/dL (ref 8.9–10.3)
Chloride: 102 mmol/L (ref 98–111)
Creatinine, Ser: 0.67 mg/dL (ref 0.61–1.24)
GFR, Estimated: >60 mL/min (ref >60)
Glucose, Bld: 111 mg/dL — ABNORMAL HIGH (ref 70–99)
Potassium: 3.7 mmol/L (ref 3.5–5.1)
Sodium: 132 mmol/L — ABNORMAL LOW (ref 135–145)

## 2021-07-21 LAB — CBC
HCT: 34.4 % — ABNORMAL LOW (ref 39.0–52.0)
Hemoglobin: 11.5 g/dL — ABNORMAL LOW (ref 13.0–17.0)
MCH: 31.7 pg (ref 26.0–34.0)
MCHC: 33.4 g/dL (ref 30.0–36.0)
MCV: 94.8 fL (ref 80.0–100.0)
Platelets: 296 10*3/uL (ref 150–400)
RBC: 3.63 MIL/uL — ABNORMAL LOW (ref 4.22–5.81)
RDW: 12.1 % (ref 11.5–15.5)
WBC: 18 10*3/uL — ABNORMAL HIGH (ref 4.0–10.5)
nRBC: 0 % (ref 0.0–0.2)

## 2021-07-21 LAB — C DIFFICILE QUICK SCREEN W PCR REFLEX
C Diff antigen: NEGATIVE
C Diff interpretation: NOT DETECTED
C Diff toxin: NEGATIVE

## 2021-07-21 LAB — GLUCOSE, CAPILLARY: Glucose-Capillary: 116 mg/dL — ABNORMAL HIGH (ref 70–99)

## 2021-07-21 MED ORDER — SODIUM CHLORIDE 0.9 % IV SOLN: INTRAVENOUS | Status: DC

## 2021-07-21 NOTE — Progress Notes (Signed)
PROGRESS NOTE     Peter [name], is a 88 y.o. male, DOB - 1933/01/07, EHU:[id number]  Admit date - 07/20/2021   Admitting Physician Bethena Roys, MD  Outpatient Primary MD for the patient is Redmond School, MD  LOS - 0  Chief Complaint  Patient presents with   Diarrhea        Brief Narrative:   88 y.o. male with medical history significant for COPD, hypertension, dysphagia, peripheral and coronary artery disease admitted with diarrhea resulting in hyponatremia and dehydration  Assessment & Plan:   Principal Problem:   Diarrhea Active Problems:   Esophageal dysphagia   PAD (peripheral artery disease) ([place])   Essential hypertension   Aspiration into respiratory tract   Diarrhea-multiple --admitted with watery stools X 1 week PTA, -  No vomiting, abdominal exam benign.   -Abdominal CT negative for pathology that would explain diarrhea.  - Completed a course of antibiotics for pneumonia about a month ago.  -Stool C. Difficile is Negative -Gi Pathogen is pending --Continue IV fluids   Aspiration Secondary to Esophageal Dysphagia-  admitted productive cough,  --WBC  22.6 >>18> -Rules out for Sepsis- - CT chest showing debris's in the trachea and airways with associated bronchial wall thickening suspicious for aspiration-  -Continue antibiotics for presumed aspiration pneumonia   Hyponatremia- chronic. Na-- 130 -Avoid dehydration   Peripheral artery disease/CAD---  continue aspirin   Hypertension- -PTA was on lisinopril and amlodipine  Disposition/Need for in-Hospital Stay- patient unable to be discharged at this time due to --diarrhea and concerns for dehydration requiring IV fluids pending GI pathogen report*  Status is: Inpatient  Remains inpatient appropriate because:   Disposition: The patient is from: Home              Anticipated d/c is to: Home              Anticipated d/c date is: 1 day              Patient currently is not medically stable to  d/c. Barriers: Not Clinically Stable-   Code Status :  -  Code Status: Full Code   Family Communication:   (patient is alert, awake and coherent)  Discussed with daughter  Consults  :  na  DVT Prophylaxis  :   - SCDs  enoxaparin (LOVENOX) injection 40 mg Start: 07/20/21 1930    Lab Results  Component Value Date   PLT 296 07/21/2021    Inpatient Medications  Scheduled Meds:  enoxaparin (LOVENOX) injection  40 mg Subcutaneous Q24H   Continuous Infusions:  cefTRIAXone (ROCEPHIN)  IV Stopped (07/21/21 1546)   metronidazole 100 mL/hr at 07/21/21 1624   PRN Meds:.acetaminophen **OR** acetaminophen, albuterol, labetalol, ondansetron **OR** ondansetron (ZOFRAN) IV, polyethylene glycol   Anti-infectives (From admission, onward)    Start     Dose/Rate Route Frequency Ordered Stop   07/21/21 1600  cefTRIAXone (ROCEPHIN) 1 g in sodium chloride 0.9 % 100 mL IVPB        1 g 200 mL/hr over 30 Minutes Intravenous Every 24 hours 07/20/21 1904     07/20/21 1600  metroNIDAZOLE (FLAGYL) IVPB 500 mg        500 mg 100 mL/hr over 60 Minutes Intravenous Every 12 hours 07/20/21 1546     07/20/21 1545  cefTRIAXone (ROCEPHIN) 1 g in sodium chloride 0.9 % 100 mL IVPB        1 g 200 mL/hr over 30 Minutes Intravenous  Once 07/20/21 1546  07/20/21 1631         Subjective: Peter [name] today has no fevers, no emesis,  No chest pain,    -Cough persist, -No significant dyspnea  Objective: Vitals:   07/20/21 2127 07/21/21 0147 07/21/21 0447 07/21/21 1332  BP: (!) 130/56 (!) 147/39 (!) 154/57 (!) 190/50  Pulse: 83 76 81 79  Resp: 18 19 19 14   Temp: 98 F (36.7 C) 98.4 F (36.9 C) 98.8 F (37.1 C) 98.4 F (36.9 C)  TempSrc: Oral Oral Oral Oral  SpO2: 91% 92% 94% 97%  Weight:      Height:        Intake/Output Summary (Last 24 hours) at 07/21/2021 1920 Last data filed at 07/21/2021 1900 Gross per 24 hour  Intake 1752.81 ml  Output 650 ml  Net 1102.81 ml   Filed Weights   07/20/21  0959 07/20/21 1747  Weight: 69.4 kg 70.3 kg    Physical Exam  Gen:- Awake Alert, speaking in complete sentences HEENT:- [place].AT, No sclera icterus Neck-Supple Neck,No JVD,.  Lungs-diminished in bases, no wheezing  CV- S1, S2 normal, regular  Abd-  +ve B.Sounds, Abd Soft, No tenderness,    Extremity/Skin:- No  edema, pedal pulses present  Psych-affect is appropriate, oriented x3 Neuro-no new focal deficits, no tremors  Data Reviewed: I have personally reviewed following labs and imaging studies  CBC: Recent Labs  Lab 07/20/21 1047 07/21/21 0436  WBC 22.6* 18.0*  NEUTROABS 17.5*  --   HGB 12.6* 11.5*  HCT 36.4* 34.4*  MCV 95.3 94.8  PLT 322 778   Basic Metabolic Panel: Recent Labs  Lab 07/20/21 1047 07/21/21 0436  NA 130* 132*  K 4.2 3.7  CL 98 102  CO2 24 23  GLUCOSE 122* 111*  BUN 12 11  CREATININE 0.68 0.67  CALCIUM 9.1 9.1   GFR: Estimated Creatinine Clearance: 65.9 mL/min (by C-G formula based on SCr of 0.67 mg/dL). Liver Function Tests: Recent Labs  Lab 07/20/21 1047  AST 20  ALT 24  ALKPHOS 97  BILITOT 0.3  PROT 6.8  ALBUMIN 3.0*   Recent Labs  Lab 07/20/21 1047  LIPASE 26   No results for input(s): AMMONIA in the last 168 hours. Coagulation Profile: No results for input(s): INR, PROTIME in the last 168 hours. Cardiac Enzymes: No results for input(s): CKTOTAL, CKMB, CKMBINDEX, TROPONINI in the last 168 hours. BNP (last 3 results) No results for input(s): PROBNP in the last 8760 hours. HbA1C: No results for input(s): HGBA1C in the last 72 hours. CBG: Recent Labs  Lab 07/20/21 2132 07/21/21 0449  GLUCAP 115* 116*   Lipid Profile: No results for input(s): CHOL, HDL, LDLCALC, TRIG, CHOLHDL, LDLDIRECT in the last 72 hours. Thyroid Function Tests: No results for input(s): TSH, T4TOTAL, FREET4, T3FREE, THYROIDAB in the last 72 hours. Anemia Panel: No results for input(s): VITAMINB12, FOLATE, FERRITIN, TIBC, IRON, RETICCTPCT in the last 72  hours. Urine analysis: No results found for: COLORURINE, APPEARANCEUR, LABSPEC, PHURINE, GLUCOSEU, HGBUR, BILIRUBINUR, KETONESUR, PROTEINUR, UROBILINOGEN, NITRITE, LEUKOCYTESUR Sepsis Labs: @LABRCNTIP (procalcitonin:4,lacticidven:4)  ) Recent Results (from the past 240 hour(s))  Resp Panel by RT-PCR (Flu A&B, Covid) Nasopharyngeal Swab     Status: None   Collection Time: 07/20/21 11:10 AM   Specimen: Nasopharyngeal Swab; Nasopharyngeal(NP) swabs in vial transport medium  Result Value Ref Range Status   SARS Coronavirus 2 by RT PCR NEGATIVE NEGATIVE Final    Comment: (NOTE) SARS-CoV-2 target nucleic acids are NOT DETECTED.  The SARS-CoV-2 RNA is  generally detectable in upper respiratory specimens during the acute phase of infection. The lowest concentration of SARS-CoV-2 viral copies this assay can detect is 138 copies/mL. A negative result does not preclude SARS-Cov-2 infection and should not be used as the sole basis for treatment or other patient management decisions. A negative result may occur with  improper specimen collection/handling, submission of specimen other than nasopharyngeal swab, presence of viral mutation(s) within the areas targeted by this assay, and inadequate number of viral copies(<138 copies/mL). A negative result must be combined with clinical observations, patient history, and epidemiological information. The expected result is Negative.  Fact Sheet for Patients:  EntrepreneurPulse.com.au  Fact Sheet for Healthcare Providers:  IncredibleEmployment.be  This test is no t yet approved or cleared by the [place] FDA and  has been authorized for detection and/or diagnosis of SARS-CoV-2 by FDA under an Emergency Use Authorization (EUA). This EUA will remain  in effect (meaning this test can be used) for the duration of the COVID-19 declaration under Section 564(b)(1) of the Act, 21 U.S.C.section 360bbb-3(b)(1), unless  the authorization is terminated  or revoked sooner.       Influenza A by PCR NEGATIVE NEGATIVE Final   Influenza B by PCR NEGATIVE NEGATIVE Final    Comment: (NOTE) The Xpert Xpress SARS-CoV-2/FLU/RSV plus assay is intended as an aid in the diagnosis of influenza from Nasopharyngeal swab specimens and should not be used as a sole basis for treatment. Nasal washings and aspirates are unacceptable for Xpert Xpress SARS-CoV-2/FLU/RSV testing.  Fact Sheet for Patients: EntrepreneurPulse.com.au  Fact Sheet for Healthcare Providers: IncredibleEmployment.be  This test is not yet approved or cleared by the [place] FDA and has been authorized for detection and/or diagnosis of SARS-CoV-2 by FDA under an Emergency Use Authorization (EUA). This EUA will remain in effect (meaning this test can be used) for the duration of the COVID-19 declaration under Section 564(b)(1) of the Act, 21 U.S.C. section 360bbb-3(b)(1), unless the authorization is terminated or revoked.  Performed at [facility], [address] [place], [place] [address]   C Difficile Quick Screen w PCR reflex     Status: None   Collection Time: 07/21/21  9:44 AM   Specimen: STOOL  Result Value Ref Range Status   C Diff antigen NEGATIVE NEGATIVE Final   C Diff toxin NEGATIVE NEGATIVE Final   C Diff interpretation No C. difficile detected.  Final    Comment: Performed at [facility], [address], [place], [place] [address]      Radiology Studies: CT CHEST ABDOMEN PELVIS W CONTRAST  Result Date: 07/20/2021 CLINICAL DATA:  Cough, shortness of breath, diarrhea. Nodular opacity seen on prior chest x-ray EXAM: CT CHEST, ABDOMEN, AND PELVIS WITH CONTRAST TECHNIQUE: Multidetector CT imaging of the chest, abdomen and pelvis was performed following the standard protocol during bolus administration of intravenous contrast. RADIATION DOSE REDUCTION: This exam was performed according to the  departmental dose-optimization program which includes automated exposure control, adjustment of the mA and/or kV according to patient size and/or use of iterative reconstruction technique. CONTRAST:  148mL OMNIPAQUE IOHEXOL 300 MG/ML  SOLN COMPARISON:  Same-day chest radiograph, lumbar spine CT 09/21/2012 FINDINGS: CT CHEST FINDINGS Cardiovascular: The heart size is normal. There is no pericardial effusion. Aortic valve, mitral annular, and coronary artery calcifications are noted. There is extensive calcified atherosclerotic plaque throughout the thoracic aorta. Mediastinum/Nodes: The thyroid is unremarkable. The esophagus is grossly unremarkable. There is no mediastinal, hilar, or axillary lymphadenopathy. Lungs/Pleura: The trachea  and central airways are patent. There is debris in the distal trachea and left main bronchus and smaller distal airways. There is central bronchial wall thickening with areas of mucoid impaction. There is no focal consolidation or pulmonary edema. There is no pleural effusion or pneumothorax. Minimal linear opacities in the left base likely reflect subsegmental atelectasis. There are no suspicious nodules. The finding on the prior chest radiograph was likely artifactual. Musculoskeletal: There is no acute osseous abnormality or aggressive osseous lesion. There is multilevel degenerative change of the thoracic spine with flowing anterior osteophytes across the lower thoracic/upper lumbar vertebral bodies consistent with diffuse idiopathic skeletal hyperostosis. CT ABDOMEN PELVIS FINDINGS Hepatobiliary: The liver is unremarkable. No focal lesions are seen. The gallbladder is distended but otherwise unremarkable, without evidence of acute cholecystitis. There is no biliary ductal dilatation. Pancreas: A subcentimeter cystic lesion in the pancreatic tail may reflect an intraductal papillary mucinous neoplasm (2-62). The pancreas is otherwise unremarkable. There are no other focal lesions.  There is no main pancreatic ductal dilatation or peripancreatic inflammatory change. Spleen: Unremarkable. Adrenals/Urinary Tract: Adrenals are unremarkable. Multiple renal cysts are noted bilaterally, with 1 on the left demonstrating a thin internal septation. There are no solid or suspicious lesions. Calcifications in the kidneys are favored to reflect vascular calcifications as opposed to renal stones. There is bilateral perinephric stranding, nonspecific. There is no hydronephrosis or hydroureter. The bladder is distended. There is a small right posterior bladder diverticulum likely reflecting chronic outlet obstruction (2-107). Stomach/Bowel: There is a small hiatal hernia. The stomach is otherwise unremarkable, allowing for the degree of decompression. There is no evidence of bowel obstruction. There is extensive sigmoid diverticulosis without evidence of acute diverticulitis. A loop of small bowel is herniated into the right inguinal canal without evidence of obstruction or strangulation. The appendix is normal. Vascular/Lymphatic: There are extensive vascular calcifications throughout the abdomen and pelvis likely resulting in high-grade stenoses of the celiac artery, SMA, bilateral renal arteries, and bilateral iliofemoral vessels. The main portal and splenic veins are patent. There is no abdominal or pelvic lymphadenopathy. Reproductive: Prostate is enlarged measuring up to 5.8 cm transverse. The prostate impresses upon the inferior aspect of the bladder. Other: There is no ascites or free air. Is a bowel containing right inguinal hernia, as above. Musculoskeletal: There is no acute osseous abnormality or aggressive osseous lesion. There are multilevel degenerative changes throughout the lumbar spine. IMPRESSION: 1. Debris in the trachea and airways with associated bronchial wall thickening suspicious for aspiration. 2. Otherwise, clear lungs with no focal consolidation or suspicious nodule. The finding on  the prior radiograph was likely artifactual. 3. No acute findings in the abdomen or pelvis. 4. Right inguinal hernia containing a loop of small bowel without evidence of obstruction or incarceration. 5. Enlarged prostate which impresses upon the inferior aspect of the bladder. A small right posterior bladder diverticulum likely reflects chronic outlet obstruction. 6. Diverticulosis without evidence of acute diverticulitis. 7. Subcentimeter cystic lesion in the pancreatic tail may reflect an intraductal papillary mucinous neoplasm. 8. Multilevel degenerative change of the spine with diffuse idiopathic skeletal hyperostosis. 9. Extensive vascular calcifications throughout the thoracoabdominal aorta and major branch vessels likely resulting in high-grade stenoses throughout the abdominal/pelvic vasculature. 10. Aortic valve, mitral annular, and coronary artery calcifications. Aortic Atherosclerosis (ICD10-I70.0). Electronically Signed   By: Valetta Mole M.D.   On: 07/20/2021 15:26   DG Chest Portable 1 View  Result Date: 07/20/2021 CLINICAL DATA:  Cough, shortness of breath EXAM: PORTABLE CHEST  1 VIEW COMPARISON:  Chest radiograph 10/20/2019 FINDINGS: The cardiomediastinal silhouette is normal There is no focal consolidation or pulmonary edema. There is a 1.2 cm nodular opacity projecting over the right lower lobe not seen on the prior study. The right costophrenic angle is partially excluded, but there is no significant right pleural effusion. There is no left effusion. There is no pneumothorax. There is no acute osseous abnormality. IMPRESSION: 1. 1.2 cm nodular opacity projecting over the right lower lobe. Recommend nonemergent CT chest for further evaluation. 2. No radiographic evidence of acute cardiopulmonary process. Electronically Signed   By: Valetta Mole M.D.   On: 07/20/2021 10:50     Scheduled Meds:  enoxaparin (LOVENOX) injection  40 mg Subcutaneous Q24H   Continuous Infusions:  cefTRIAXone  (ROCEPHIN)  IV Stopped (07/21/21 1546)   metronidazole 100 mL/hr at 07/21/21 1624     LOS: 0 days    Roxan Hockey M.D on 07/21/2021 at 7:20 PM  Go to [email or web address] - for contact info  [facility] - Office  [phone]  If 7PM-7AM, please contact night-coverage [email or web address] Password [facility] 07/21/2021, 7:20 PM

## 2021-07-21 NOTE — Evaluation (Signed)
Clinical/Bedside Swallow Evaluation Patient Details  Name: Peter [name] MRN: [id number] Date of Birth: 05/01/1933  Today's Date: 07/21/2021 Time: SLP Start Time (ACUTE ONLY): 7829 SLP Stop Time (ACUTE ONLY): 1326 SLP Time Calculation (min) (ACUTE ONLY): 24 min  Past Medical History:  Past Medical History:  Diagnosis Date   Anxiety    Arthritis    BPH (benign prostatic hyperplasia)    Chronic pain    Complication of anesthesia    had an anxiety attack once   COPD (chronic obstructive pulmonary disease) (HCC)    Depression    GERD (gastroesophageal reflux disease)    Hiatal hernia    small   History of nuclear stress test    Nuc 5/19: soft tissue attenuation; no ischemia or scar, EF 72; Low Risk   HTN (hypertension)    Internal carotid artery stenosis, left    Pneumonia    Prostate disease    h/o elevated PSA, neg bx, [facility]   Sciatica    Wears glasses    Wears partial dentures    Past Surgical History:  Past Surgical History:  Procedure Laterality Date   ABDOMINAL AORTOGRAM W/LOWER EXTREMITY N/A 10/26/2017   Procedure: ABDOMINAL AORTOGRAM W/LOWER EXTREMITY;  Surgeon: Waynetta Sandy, MD;  Location: [facility];  Service: Cardiovascular;  Laterality: N/A;  bilateral   BALLOON DILATION  04/23/2018   Procedure: BALLOON DILATION;  Surgeon: Daneil Dolin, MD;  [facility];  Service: Endoscopy;;  esophagus   BIOPSY  01/29/2014   Procedure: BIOPSY;  Surgeon: Daneil Dolin, MD;  [facility];  Service: Endoscopy;;  Gastric and Esophageal   BIOPSY  06/14/2018   Procedure: BIOPSY;  Surgeon: Daneil Dolin, MD;  [facility];  Service: Endoscopy;;  esophagus   CAROTID ENDARTERECTOMY Left 03/20/2017   CATARACT EXTRACTION W/PHACO  05/21/2012   Procedure: CATARACT EXTRACTION PHACO AND INTRAOCULAR LENS PLACEMENT ([place]);  Surgeon: Tonny Branch, MD;  Location: [facility];  Service: Ophthalmology;  Laterality: Right;  CDE 19.34    COLONOSCOPY  12/2004   diverticulosis, hyperplastic polyps, hemorrhoids   ENDARTERECTOMY Left 03/20/2017   Procedure: ENDARTERECTOMY CAROTID-LEFT;  Surgeon: Conrad Culloden, MD;  Location: [facility];  Service: Vascular;  Laterality: Left;   ESOPHAGOGASTRODUODENOSCOPY  2003   schatzki ring, small vascular abnormality of duodenal bulb ?Dieulafoy's lesion (ablated)   ESOPHAGOGASTRODUODENOSCOPY  11/10/10   Dr. Gala Romney dilation 2 rings with 7F   ESOPHAGOGASTRODUODENOSCOPY N/A 01/29/2014   Dr.Rourk- multiple esophageal rings/webs- s/p dialation. small hiatal hernia. abnormal gastric mucosa. bx= mild chronic inactive gastritis (oxyntic mucosa), squamous mucosa with epithelial changes consistent with reflux related injury.   ESOPHAGOGASTRODUODENOSCOPY (EGD) WITH PROPOFOL N/A 04/23/2018   Procedure: ESOPHAGOGASTRODUODENOSCOPY (EGD) WITH PROPOFOL;  Surgeon: Daneil Dolin, MD;  [facility];  Service: Endoscopy;  Laterality: N/A;  2:00pm   ESOPHAGOGASTRODUODENOSCOPY (EGD) WITH PROPOFOL N/A 06/14/2018   Dr. Gala Romney: Obstructing Schatzki ring, status post dilation medium-sized hiatal hernia, biopsies from the mid to distal esophagus obtained which were benign.   MALONEY DILATION N/A 06/14/2018   Procedure: Venia Minks DILATION;  Surgeon: Daneil Dolin, MD;  [facility];  Service: Endoscopy;  Laterality: N/A;   MULTIPLE TOOTH EXTRACTIONS     PATCH ANGIOPLASTY Left 03/20/2017   PATCH ANGIOPLASTY Left 03/20/2017   Procedure: PATCH ANGIOPLASTY;  Surgeon: Conrad Haxtun, MD;  Location: [facility];  Service: Vascular;  Laterality: Left;   HPI:  Peter [name] is a  88 y.o. male with medical history significant for COPD, hypertension, dysphagia, peripheral and coronary artery disease.  Patient was brought to the ED via EMS reports of diarrhea of 1 week duration and weakness.  Patient has also had an ongoing productive cough for a while.  At time of my evaluation patient is awake alert oriented able to answer  questions but daughter Peter [name] is at bedside and assist with the history.  Patient has been cough productive of thick phlegm since November.  He was evaluated by his outpatient provider and prescribed a course of Augmentin ( daughter says its amoxicillin) for presumed pneumonia which he had completed at least 4 weeks ago.  No difficulty breathing.  Cough is not particularly worse.  He has episodes of noisy breathing/wheezing.  He is has barely been mobile in the past 3 years. Over the past week patient developed watery diarrhea, daughter reports multiple episodes every day of just pure water.  Patient has had swallowing problems for years and has had esophageal dilatation about 5 times.  He has been having difficulty swallowing which is chronic, and he is on a dysphagia diet that consist of very soft food and liquids.  No abdominal pain.  No vomiting. X-ray without acute abnormality.   CT abdomen pelvis and chest with contrast-shows debris's in the trachea and airways with associated bronchial wall thickening suspicious for aspiration.,  Lungs are clear with no focal consolidation or suspicious nodule.  IV ceftriaxone and metronidazole given for aspiration pneumonia.    Assessment / Plan / Recommendation  Clinical Impression  Clinical swallow evaluation completed at bedside. No family currently present, however his daughter was here earlier. Pt reports a history of esophageal dysphagia with frequent dilations in the past, last being 2019. He states that he stopped going for them because they were no longer as effective as they once were. He reports chronic cough with expectoration of phlegm at home and states that he consumes soft solids, all liquids, and po medication whole with thin liquids typically. Oral motor examination reveals missing some dentition and he wears dentures/partial but they are at home. His tongue is coated. No gross facial asymmetry. Pt self presented cup and straw sips of thin liquids with  occasional delayed cough and expectoration of phlegm throughout the assessment. He presented with prolonged and inefficient oral transit of solids with resultant min lingual residuals which eventually cleared. Recommend D3/mech soft and thin liquids with aspiration and reflux precautions and SLP to follow tomorrow to determine if MBSS would be beneficial. Medications can be presented whole with water. SLP Visit Diagnosis: Dysphagia, unspecified (R13.10)    Aspiration Risk  Mild aspiration risk    Diet Recommendation Dysphagia 3 (Mech soft);Thin liquid   Liquid Administration via: Cup;Straw Medication Administration: Whole meds with liquid Supervision: Patient able to self feed;Intermittent supervision to cue for compensatory strategies Compensations: Small sips/bites;Slow rate Postural Changes: Seated upright at 90 degrees;Remain upright for at least 30 minutes after po intake    Other  Recommendations Recommended Consults: Consider esophageal assessment Oral Care Recommendations: Oral care BID;Staff/trained caregiver to provide oral care Other Recommendations: Clarify dietary restrictions    Recommendations for follow up therapy are one component of a multi-disciplinary discharge planning process, led by the attending physician.  Recommendations may be updated based on patient status, additional functional criteria and insurance authorization.  Follow up Recommendations  (pending)      Assistance Recommended at Discharge Intermittent Supervision/Assistance  Functional Status Assessment Patient has had a recent  decline in their functional status and demonstrates the ability to make significant improvements in function in a reasonable and predictable amount of time.  Frequency and Duration min 2x/week  1 week       Prognosis Prognosis for Safe Diet Advancement: Good      Swallow Study   General Date of Onset: 07/20/21 HPI: Peter [name] is a 88 y.o. male with medical history  significant for COPD, hypertension, dysphagia, peripheral and coronary artery disease.  Patient was brought to the ED via EMS reports of diarrhea of 1 week duration and weakness.  Patient has also had an ongoing productive cough for a while.  At time of my evaluation patient is awake alert oriented able to answer questions but daughter Peter [name] is at bedside and assist with the history.  Patient has been cough productive of thick phlegm since November.  He was evaluated by his outpatient provider and prescribed a course of Augmentin ( daughter says its amoxicillin) for presumed pneumonia which he had completed at least 4 weeks ago.  No difficulty breathing.  Cough is not particularly worse.  He has episodes of noisy breathing/wheezing.  He is has barely been mobile in the past 3 years. Over the past week patient developed watery diarrhea, daughter reports multiple episodes every day of just pure water.  Patient has had swallowing problems for years and has had esophageal dilatation about 5 times.  He has been having difficulty swallowing which is chronic, and he is on a dysphagia diet that consist of very soft food and liquids.  No abdominal pain.  No vomiting. X-ray without acute abnormality.   CT abdomen pelvis and chest with contrast-shows debris's in the trachea and airways with associated bronchial wall thickening suspicious for aspiration.,  Lungs are clear with no focal consolidation or suspicious nodule.  IV ceftriaxone and metronidazole given for aspiration pneumonia. Type of Study: Bedside Swallow Evaluation Diet Prior to this Study: NPO Temperature Spikes Noted: No Respiratory Status: Room air History of Recent Intubation: No Behavior/Cognition: Alert;Cooperative;Pleasant [name] Oral Cavity Assessment: Within Functional Limits;Other (comment) (lingual coating) Oral Care Completed by SLP: Yes Oral Cavity - Dentition: Missing dentition (has some lower dentition, missing uppers, wears dentures for po,  but they are at home) Vision: Functional for self-feeding Self-Feeding Abilities: Able to feed self Patient Positioning: Upright in bed Baseline Vocal Quality: Normal Volitional Cough: Strong;Congested Volitional Swallow: Able to elicit    Oral/Motor/Sensory Function Overall Oral Motor/Sensory Function: Within functional limits   Ice Chips Ice chips: Within functional limits Presentation: Spoon   Thin Liquid Thin Liquid: Impaired Presentation: Cup;Self Fed;Straw Pharyngeal  Phase Impairments: Cough - Delayed    Nectar Thick Nectar Thick Liquid: Not tested   Honey Thick Honey Thick Liquid: Not tested   Puree Puree: Within functional limits Presentation: Spoon   Solid     Solid: Impaired Presentation: Spoon Oral Phase Impairments: Impaired mastication;Reduced lingual movement/coordination Oral Phase Functional Implications: Prolonged oral transit;Oral residue     Thank you,  Genene Churn, [place]  Domnique Vantine 07/21/2021,1:35 PM

## 2021-07-21 NOTE — Care Management Obs Status (Signed)
[facility] NOTIFICATION   Patient Details  Name: Peter [name] MRN: [id number] Date of Birth: 03-01-1933   Medicare Observation Status Notification Given:  Yes    [name] 07/21/2021, 4:09 PM

## 2021-07-21 NOTE — TOC Progression Note (Signed)
Transition of Care [facility]) - Progression Note    Patient Details  Name: DEKLYN GIBBON MRN: [id number] Date of Birth: [birth year]  Transition of Care [facility]) CM/SW Contact  Salome Arnt, [place] Phone Number: 07/21/2021, 10:41 AM  Clinical Narrative:   Transition of Care [facility]) Screening Note   Patient Details  Name: LYRICK LAGRAND Date of Birth: 27-Mar-1933   Transition of Care [facility]) CM/SW Contact:    Salome Arnt, [place] Phone Number: 07/21/2021, 10:42 AM    Transition of Care Department [facility]) has reviewed patient and no TOC needs have been identified at this time. We will continue to monitor patient advancement through interdisciplinary progression rounds. If new patient transition needs arise, please place a TOC consult.         Barriers to Discharge: Continued Medical Work up  Expected Discharge Plan and Services                                                 Social Determinants of Health (SDOH) Interventions    Readmission Risk Interventions No flowsheet data found.

## 2021-07-21 NOTE — Plan of Care (Signed)
  Problem: Education: Goal: Knowledge of General Education information will improve Description Including pain rating scale, medication(s)/side effects and non-pharmacologic comfort measures Outcome: Progressing   Problem: Health Behavior/Discharge Planning: Goal: Ability to manage health-related needs will improve Outcome: Progressing   

## 2021-07-22 LAB — GASTROINTESTINAL PANEL BY PCR, STOOL (REPLACES STOOL CULTURE)

## 2021-07-22 LAB — CBC
HCT: 35.5 % — ABNORMAL LOW (ref 39.0–52.0)
Hemoglobin: 12.1 g/dL — ABNORMAL LOW (ref 13.0–17.0)
MCH: 32.1 pg (ref 26.0–34.0)
MCHC: 34.1 g/dL (ref 30.0–36.0)
MCV: 94.2 fL (ref 80.0–100.0)
Platelets: 292 10*3/uL (ref 150–400)
RBC: 3.77 MIL/uL — ABNORMAL LOW (ref 4.22–5.81)
RDW: 12.3 % (ref 11.5–15.5)
WBC: 14.6 10*3/uL — ABNORMAL HIGH (ref 4.0–10.5)
nRBC: 0 % (ref 0.0–0.2)

## 2021-07-22 LAB — BASIC METABOLIC PANEL
Anion gap: 7 (ref 5–15)
BUN: 8 mg/dL (ref 8–23)
CO2: 22 mmol/L (ref 22–32)
Calcium: 8.7 mg/dL — ABNORMAL LOW (ref 8.9–10.3)
Chloride: 103 mmol/L (ref 98–111)
Creatinine, Ser: 0.62 mg/dL (ref 0.61–1.24)
GFR, Estimated: >60 mL/min (ref >60)
Glucose, Bld: 106 mg/dL — ABNORMAL HIGH (ref 70–99)
Potassium: 3.2 mmol/L — ABNORMAL LOW (ref 3.5–5.1)
Sodium: 132 mmol/L — ABNORMAL LOW (ref 135–145)

## 2021-07-22 MED ORDER — ASPIRIN 81 MG PO TBEC: 81.0000 mg | DELAYED_RELEASE_TABLET | Freq: Every day | ORAL | 12 refills | Status: AC

## 2021-07-22 MED ORDER — AMOXICILLIN-POT CLAVULANATE 875-125 MG PO TABS: 1.0000 | ORAL_TABLET | Freq: Two times a day (BID) | ORAL | 0 refills | Status: DC

## 2021-07-22 MED ORDER — ONDANSETRON HCL 4 MG PO TABS: 4.0000 mg | ORAL_TABLET | Freq: Four times a day (QID) | ORAL | 0 refills | Status: DC | PRN

## 2021-07-22 MED ORDER — MOMETASONE FURO-FORMOTEROL FUM 200-5 MCG/ACT IN AERO: 2.0000 | INHALATION_SPRAY | Freq: Two times a day (BID) | RESPIRATORY_TRACT | 2 refills | Status: DC

## 2021-07-22 MED ORDER — FINASTERIDE 5 MG PO TABS: 5.0000 mg | ORAL_TABLET | Freq: Every day | ORAL | 2 refills | Status: DC

## 2021-07-22 MED ORDER — POTASSIUM CHLORIDE CRYS ER 20 MEQ PO TBCR
40.0000 meq | EXTENDED_RELEASE_TABLET | ORAL | Status: DC
  Administered 2021-07-22: 40 meq via ORAL
  Filled 2021-07-22: qty 2

## 2021-07-22 MED ORDER — POTASSIUM CHLORIDE ER 20 MEQ PO TBCR
20.0000 meq | EXTENDED_RELEASE_TABLET | Freq: Every day | ORAL | 0 refills | Status: DC
Start: 2021-07-22 — End: 2021-07-27

## 2021-07-22 MED ORDER — PANTOPRAZOLE SODIUM 40 MG PO TBEC: 40.0000 mg | DELAYED_RELEASE_TABLET | Freq: Every day | ORAL | 2 refills | Status: DC

## 2021-07-22 MED ORDER — ACETAMINOPHEN 325 MG PO TABS
650.0000 mg | ORAL_TABLET | Freq: Four times a day (QID) | ORAL | 0 refills | Status: AC | PRN
Start: 2021-07-22 — End: ?

## 2021-07-22 MED ORDER — POLYETHYLENE GLYCOL 3350 17 G PO PACK: 17.0000 g | PACK | Freq: Every day | ORAL | 0 refills | Status: DC | PRN

## 2021-07-22 MED ORDER — ALBUTEROL SULFATE HFA 108 (90 BASE) MCG/ACT IN AERS
1.0000 | INHALATION_SPRAY | Freq: Four times a day (QID) | RESPIRATORY_TRACT | 1 refills | Status: DC | PRN
Start: 2021-07-22 — End: 2021-07-27

## 2021-07-22 MED ORDER — LISINOPRIL 40 MG PO TABS: 40.0000 mg | ORAL_TABLET | Freq: Every day | ORAL | 3 refills | Status: DC

## 2021-07-22 NOTE — Plan of Care (Signed)
  Problem: Education: Goal: Knowledge of General Education information will improve Description: Including pain rating scale, medication(s)/side effects and non-pharmacologic comfort measures Outcome: Progressing   Problem: Health Behavior/Discharge Planning: Goal: Ability to manage health-related needs will improve Outcome: Progressing   Problem: Clinical Measurements: Goal: Diagnostic test results will improve Outcome: Progressing   

## 2021-07-22 NOTE — Discharge Summary (Signed)
Peter [name], is a 88 y.o. male  DOB [birth year]  MRN [id number].  Admission date:  07/20/2021  Admitting Physician  Roxan Hockey, MD  Discharge Date:  07/22/2021   Primary MD  Redmond School, MD  Recommendations for primary care physician for things to follow:   1)Avoid ibuprofen/Advil/Aleve/Motrin/Goody Powders/Naproxen/BC powders/Meloxicam/Diclofenac/Indomethacin and other Nonsteroidal anti-inflammatory medications as these will make you more likely to bleed and can cause stomach ulcers, can also cause Kidney problems.   2)Repeat CBC and BMP blood test with the primary care physician in 1 week advised  3)Avoid dehydration, please drink plenty fluids   Admission Diagnosis  Diarrhea [R19.7] Hyponatremia [E87.1]   Discharge Diagnosis  Diarrhea [R19.7] Hyponatremia [E87.1]    Principal Problem:   Diarrhea Active Problems:   Esophageal dysphagia   PAD (peripheral artery disease) (HCC)   Essential hypertension   Aspiration into respiratory tract   Hyponatremia      Past Medical History:  Diagnosis Date   Anxiety    Arthritis    BPH (benign prostatic hyperplasia)    Chronic pain    Complication of anesthesia    had an anxiety attack once   COPD (chronic obstructive pulmonary disease) (HCC)    Depression    GERD (gastroesophageal reflux disease)    Hiatal hernia    small   History of nuclear stress test    Nuc 5/19: soft tissue attenuation; no ischemia or scar, EF 72; Low Risk   HTN (hypertension)    Internal carotid artery stenosis, left    Pneumonia    Prostate disease    h/o elevated PSA, neg bx, [facility]   Sciatica    Wears glasses    Wears partial dentures     Past Surgical History:  Procedure Laterality Date   ABDOMINAL AORTOGRAM W/LOWER EXTREMITY N/A 10/26/2017   Procedure: ABDOMINAL AORTOGRAM W/LOWER EXTREMITY;  Surgeon: Waynetta Sandy, MD;   Location: [facility];  Service: Cardiovascular;  Laterality: N/A;  bilateral   BALLOON DILATION  04/23/2018   Procedure: BALLOON DILATION;  Surgeon: Daneil Dolin, MD;  [facility];  Service: Endoscopy;;  esophagus   BIOPSY  01/29/2014   Procedure: BIOPSY;  Surgeon: Daneil Dolin, MD;  [facility];  Service: Endoscopy;;  Gastric and Esophageal   BIOPSY  06/14/2018   Procedure: BIOPSY;  Surgeon: Daneil Dolin, MD;  [facility];  Service: Endoscopy;;  esophagus   CAROTID ENDARTERECTOMY Left 03/20/2017   CATARACT EXTRACTION W/PHACO  05/21/2012   Procedure: CATARACT EXTRACTION PHACO AND INTRAOCULAR LENS PLACEMENT ([place]);  Surgeon: Tonny Branch, MD;  Location: [facility];  Service: Ophthalmology;  Laterality: Right;  CDE 19.34   COLONOSCOPY  12/2004   diverticulosis, hyperplastic polyps, hemorrhoids   ENDARTERECTOMY Left 03/20/2017   Procedure: ENDARTERECTOMY CAROTID-LEFT;  Surgeon: Conrad Goldonna, MD;  Location: [facility];  Service: Vascular;  Laterality: Left;   ESOPHAGOGASTRODUODENOSCOPY  2003   schatzki ring, small vascular abnormality of duodenal bulb ?Dieulafoy's lesion (ablated)  ESOPHAGOGASTRODUODENOSCOPY  11/10/10   Dr. Gala Romney dilation 2 rings with 30F   ESOPHAGOGASTRODUODENOSCOPY N/A 01/29/2014   Dr.Rourk- multiple esophageal rings/webs- s/p dialation. small hiatal hernia. abnormal gastric mucosa. bx= mild chronic inactive gastritis (oxyntic mucosa), squamous mucosa with epithelial changes consistent with reflux related injury.   ESOPHAGOGASTRODUODENOSCOPY (EGD) WITH PROPOFOL N/A 04/23/2018   Procedure: ESOPHAGOGASTRODUODENOSCOPY (EGD) WITH PROPOFOL;  Surgeon: Daneil Dolin, MD;  [facility];  Service: Endoscopy;  Laterality: N/A;  2:00pm   ESOPHAGOGASTRODUODENOSCOPY (EGD) WITH PROPOFOL N/A 06/14/2018   Dr. Gala Romney: Obstructing Schatzki ring, status post dilation medium-sized hiatal hernia, biopsies from the mid to distal esophagus obtained which  were benign.   MALONEY DILATION N/A 06/14/2018   Procedure: Venia Minks DILATION;  Surgeon: Daneil Dolin, MD;  [facility];  Service: Endoscopy;  Laterality: N/A;   MULTIPLE TOOTH EXTRACTIONS     PATCH ANGIOPLASTY Left 03/20/2017   PATCH ANGIOPLASTY Left 03/20/2017   Procedure: PATCH ANGIOPLASTY;  Surgeon: Conrad [place]in[place], MD;  [facility];  Service: Vascular;  Laterality: Left;     HPI  from the history and physical done on the day of admission:   Chief Complaint: Cough, diarrhea   HPI: Peter [name] is a 88 y.o. male with medical history significant for COPD, hypertension, dysphagia, peripheral and coronary artery disease. Patient was brought to the ED via EMS reports of diarrhea of 1 week duration and weakness.  Patient has also had an ongoing productive cough for a while.  At time of my evaluation patient is awake alert oriented able to answer questions but daughter [name] is at bedside and assist with the history. Patient has been cough productive of thick phlegm since November.  He was evaluated by his outpatient provider and prescribed a course of Augmentin ( daughter says its amoxicillin) for presumed pneumonia which he had completed at least 4 weeks ago.  No difficulty breathing.  Cough is not particularly worse.  He has episodes of noisy breathing/wheezing.  He is has barely been mobile in the past 3 years.   Over the past week patient developed watery diarrhea, daughter reports multiple episodes every day of just pure water.  Patient has had swallowing problems for years and has had esophageal dilatation about 5 times.  He has been having difficulty swallowing which is chronic, and he is on a dysphagia diet that consist of very soft food and liquids.  No abdominal pain.  No vomiting.   ED Course: Temperature 98.9.  Heart rate 80s to 96.  Respiratory rate 19-25.  Blood pressure systolic 378H to 885O.  O2 sats 97 200% on room air.  COVID and influenza test negative.  Lactic  acid 1.4.  Leukocytosis of 22.  X-ray without acute abnormality.  CT abdomen pelvis and chest with contrast-shows debris's in the trachea and airways with associated bronchial wall thickening suspicious for aspiration.,  Lungs are clear with no focal consolidation or suspicious nodule. IV ceftriaxone and metronidazole given for aspiration pneumonia.  Hospitalist to admit.   Review of Systems: As per HPI all other systems reviewed and negative.   Hospital Course:     Diarrhea-multiple --admitted with watery stools X 1 week PTA, -  No vomiting, abdominal exam benign.   -Abdominal CT negative for pathology that would explain diarrhea.  - Completed a course of antibiotics for pneumonia about a month ago.  -Stool C. Difficile is Negative -Gi Pathogen is +ve for Norovirus Received IVF for dehydration-   Aspiration  Secondary to Esophageal Dysphagia-  admitted productive cough,  --WBC 22 >. 22.6 >>18>. 14.6 -Rules out for Sepsis- - CT chest showing debris's in the trachea and airways with associated bronchial wall thickening suspicious for aspiration-  -Treated with Rocephin/azithromycin, -ok to dc on Augmentin   Hyponatremia- chronic. Na-- 130 >.132  Hypokalemia--due to GI losses, replaced   Peripheral artery disease/CAD---  continue aspirin   Hypertension- -Discharge on amlodipine and lisinopril  Discharge Condition: stable  Follow UP   Follow-up Information     Redmond School, MD. Schedule an appointment as soon as possible for a visit in 1 week(s).   Specialty: Internal Medicine Why: Repeat CBC and BMP Blood Test Contact information: [address] [place] [address] [phone]         Fay Records, MD .   Specialty: Cardiology Contact information: [place] [address] [phone]                 Diet and Activity recommendation:  As advised  Discharge Instructions    Discharge Instructions     Call MD  for:  difficulty breathing, headache or visual disturbances   Complete by: As directed    Call MD for:  persistant dizziness or light-headedness   Complete by: As directed    Call MD for:  persistant nausea and vomiting   Complete by: As directed    Call MD for:  temperature >100.4   Complete by: As directed    Diet - low sodium heart healthy   Complete by: As directed    Discharge instructions   Complete by: As directed    1)Avoid ibuprofen/Advil/Aleve/Motrin/Goody Powders/Naproxen/BC powders/Meloxicam/Diclofenac/Indomethacin and other Nonsteroidal anti-inflammatory medications as these will make you more likely to bleed and can cause stomach ulcers, can also cause Kidney problems.   2)Repeat CBC and BMP blood test with the primary care physician in 1 week advised  3) avoid dehydration, please drink plenty fluids   Increase activity slowly   Complete by: As directed          Discharge Medications     Allergies as of 07/22/2021   No Active Allergies      Medication List     STOP taking these medications    azithromycin 250 MG tablet Commonly known as: ZITHROMAX   ciprofloxacin-dexamethasone OTIC suspension Commonly known as: Ciprodex   doxycycline 100 MG capsule Commonly known as: VIBRAMYCIN   fluticasone 50 MCG/ACT nasal spray Commonly known as: FLONASE   Hibiclens 4 % external liquid Generic drug: chlorhexidine   lactulose 10 GM/15ML solution Commonly known as: CHRONULAC       TAKE these medications    acetaminophen 325 MG tablet Commonly known as: TYLENOL Take 2 tablets (650 mg total) by mouth every 6 (six) hours as needed for mild pain, fever or headache (or Fever >/= 101).   albuterol 108 (90 Base) MCG/ACT inhaler Commonly known as: VENTOLIN HFA Inhale 1-2 puffs into the lungs every 6 (six) hours as needed for wheezing or shortness of breath.   ALPRAZolam 0.5 MG tablet Commonly known as: XANAX Take 0.5 mg by mouth 2 (two) times daily as needed  for anxiety.   amLODipine 5 MG tablet Commonly known as: NORVASC Take 5 mg by mouth daily.   amoxicillin-clavulanate 875-125 MG tablet Commonly known as: Augmentin Take 1 tablet by mouth 2 (two) times daily for 5 days.   aspirin 81 MG EC tablet Take 1 tablet (81 mg total)  by mouth daily with breakfast. What changed: when to take this   finasteride 5 MG tablet Commonly known as: PROSCAR Take 1 tablet (5 mg total) by mouth daily after lunch.   lisinopril 40 MG tablet Commonly known as: ZESTRIL Take 1 tablet (40 mg total) by mouth daily.   mometasone-formoterol 200-5 MCG/ACT Aero Commonly known as: DULERA Inhale 2 puffs into the lungs 2 (two) times daily. What changed: how much to take   ondansetron 4 MG tablet Commonly known as: ZOFRAN Take 1 tablet (4 mg total) by mouth every 6 (six) hours as needed for nausea.   pantoprazole 40 MG tablet Commonly known as: PROTONIX Take 1 tablet (40 mg total) by mouth daily. TAKE (1) TABLET BY MOUTH ONCE DAILY BEFORE BREAKFAST. Strength: 40 mg What changed: See the new instructions.   polyethylene glycol 17 g packet Commonly known as: MIRALAX / GLYCOLAX Take 17 g by mouth daily as needed for mild constipation.   Potassium Chloride ER 20 MEQ Tbcr Take 20 mEq by mouth daily. 1 tab daily by mouth        Major procedures and Radiology Reports - PLEASE review detailed and final reports for all details, in brief -   CT CHEST ABDOMEN PELVIS W CONTRAST  Result Date: 07/20/2021 CLINICAL DATA:  Cough, shortness of breath, diarrhea. Nodular opacity seen on prior chest x-ray EXAM: CT CHEST, ABDOMEN, AND PELVIS WITH CONTRAST TECHNIQUE: Multidetector CT imaging of the chest, abdomen and pelvis was performed following the standard protocol during bolus administration of intravenous contrast. RADIATION DOSE REDUCTION: This exam was performed according to the departmental dose-optimization program which includes automated exposure control,  adjustment of the mA and/or kV according to patient size and/or use of iterative reconstruction technique. CONTRAST:  134mL OMNIPAQUE IOHEXOL 300 MG/ML  SOLN COMPARISON:  Same-day chest radiograph, lumbar spine CT 09/21/2012 FINDINGS: CT CHEST FINDINGS Cardiovascular: The heart size is normal. There is no pericardial effusion. Aortic valve, mitral annular, and coronary artery calcifications are noted. There is extensive calcified atherosclerotic plaque throughout the thoracic aorta. Mediastinum/Nodes: The thyroid is unremarkable. The esophagus is grossly unremarkable. There is no mediastinal, hilar, or axillary lymphadenopathy. Lungs/Pleura: The trachea and central airways are patent. There is debris in the distal trachea and left main bronchus and smaller distal airways. There is central bronchial wall thickening with areas of mucoid impaction. There is no focal consolidation or pulmonary edema. There is no pleural effusion or pneumothorax. Minimal linear opacities in the left base likely reflect subsegmental atelectasis. There are no suspicious nodules. The finding on the prior chest radiograph was likely artifactual. Musculoskeletal: There is no acute osseous abnormality or aggressive osseous lesion. There is multilevel degenerative change of the thoracic spine with flowing anterior osteophytes across the lower thoracic/upper lumbar vertebral bodies consistent with diffuse idiopathic skeletal hyperostosis. CT ABDOMEN PELVIS FINDINGS Hepatobiliary: The liver is unremarkable. No focal lesions are seen. The gallbladder is distended but otherwise unremarkable, without evidence of acute cholecystitis. There is no biliary ductal dilatation. Pancreas: A subcentimeter cystic lesion in the pancreatic tail may reflect an intraductal papillary mucinous neoplasm (2-62). The pancreas is otherwise unremarkable. There are no other focal lesions. There is no main pancreatic ductal dilatation or peripancreatic inflammatory  change. Spleen: Unremarkable. Adrenals/Urinary Tract: Adrenals are unremarkable. Multiple renal cysts are noted bilaterally, with 1 on the left demonstrating a thin internal septation. There are no solid or suspicious lesions. Calcifications in the kidneys are favored to reflect vascular calcifications as opposed to renal stones.  There is bilateral perinephric stranding, nonspecific. There is no hydronephrosis or hydroureter. The bladder is distended. There is a small right posterior bladder diverticulum likely reflecting chronic outlet obstruction (2-107). Stomach/Bowel: There is a small hiatal hernia. The stomach is otherwise unremarkable, allowing for the degree of decompression. There is no evidence of bowel obstruction. There is extensive sigmoid diverticulosis without evidence of acute diverticulitis. A loop of small bowel is herniated into the right inguinal canal without evidence of obstruction or strangulation. The appendix is normal. Vascular/Lymphatic: There are extensive vascular calcifications throughout the abdomen and pelvis likely resulting in high-grade stenoses of the celiac artery, SMA, bilateral renal arteries, and bilateral iliofemoral vessels. The main portal and splenic veins are patent. There is no abdominal or pelvic lymphadenopathy. Reproductive: Prostate is enlarged measuring up to 5.8 cm transverse. The prostate impresses upon the inferior aspect of the bladder. Other: There is no ascites or free air. Is a bowel containing right inguinal hernia, as above. Musculoskeletal: There is no acute osseous abnormality or aggressive osseous lesion. There are multilevel degenerative changes throughout the lumbar spine. IMPRESSION: 1. Debris in the trachea and airways with associated bronchial wall thickening suspicious for aspiration. 2. Otherwise, clear lungs with no focal consolidation or suspicious nodule. The finding on the prior radiograph was likely artifactual. 3. No acute findings in the  abdomen or pelvis. 4. Right inguinal hernia containing a loop of small bowel without evidence of obstruction or incarceration. 5. Enlarged prostate which impresses upon the inferior aspect of the bladder. A small right posterior bladder diverticulum likely reflects chronic outlet obstruction. 6. Diverticulosis without evidence of acute diverticulitis. 7. Subcentimeter cystic lesion in the pancreatic tail may reflect an intraductal papillary mucinous neoplasm. 8. Multilevel degenerative change of the spine with diffuse idiopathic skeletal hyperostosis. 9. Extensive vascular calcifications throughout the thoracoabdominal aorta and major branch vessels likely resulting in high-grade stenoses throughout the abdominal/pelvic vasculature. 10. Aortic valve, mitral annular, and coronary artery calcifications. Aortic Atherosclerosis (ICD10-I70.0). Electronically Signed   By: Valetta Mole M.D.   On: 07/20/2021 15:26   DG Chest Portable 1 View  Result Date: 07/20/2021 CLINICAL DATA:  Cough, shortness of breath EXAM: PORTABLE CHEST 1 VIEW COMPARISON:  Chest radiograph 10/20/2019 FINDINGS: The cardiomediastinal silhouette is normal There is no focal consolidation or pulmonary edema. There is a 1.2 cm nodular opacity projecting over the right lower lobe not seen on the prior study. The right costophrenic angle is partially excluded, but there is no significant right pleural effusion. There is no left effusion. There is no pneumothorax. There is no acute osseous abnormality. IMPRESSION: 1. 1.2 cm nodular opacity projecting over the right lower lobe. Recommend nonemergent CT chest for further evaluation. 2. No radiographic evidence of acute cardiopulmonary process. Electronically Signed   By: Valetta Mole M.D.   On: 07/20/2021 10:50    Micro Results   Recent Results (from the past 240 hour(s))  Resp Panel by RT-PCR (Flu A&B, Covid) Nasopharyngeal Swab     Status: None   Collection Time: 07/20/21 11:10 AM   Specimen:  Nasopharyngeal Swab; Nasopharyngeal(NP) swabs in vial transport medium  Result Value Ref Range Status   SARS Coronavirus 2 by RT PCR NEGATIVE NEGATIVE Final    Comment: (NOTE) SARS-CoV-2 target nucleic acids are NOT DETECTED.  The SARS-CoV-2 RNA is generally detectable in upper respiratory specimens during the acute phase of infection. The lowest concentration of SARS-CoV-2 viral copies this assay can detect is 138 copies/mL. A negative result does not preclude  SARS-Cov-2 infection and should not be used as the sole basis for treatment or other patient management decisions. A negative result may occur with  improper specimen collection/handling, submission of specimen other than nasopharyngeal swab, presence of viral mutation(s) within the areas targeted by this assay, and inadequate number of viral copies(<138 copies/mL). A negative result must be combined with clinical observations, patient history, and epidemiological information. The expected result is Negative.  Fact Sheet for Patients:  EntrepreneurPulse.com.au  Fact Sheet for Healthcare Providers:  IncredibleEmployment.be  This test is no t yet approved or cleared by the [place] FDA and  has been authorized for detection and/or diagnosis of SARS-CoV-2 by FDA under an Emergency Use Authorization (EUA). This EUA will remain  in effect (meaning this test can be used) for the duration of the COVID-19 declaration under Section 564(b)(1) of the Act, 21 U.S.C.section 360bbb-3(b)(1), unless the authorization is terminated  or revoked sooner.       Influenza A by PCR NEGATIVE NEGATIVE Final   Influenza B by PCR NEGATIVE NEGATIVE Final    Comment: (NOTE) The Xpert Xpress SARS-CoV-2/FLU/RSV plus assay is intended as an aid in the diagnosis of influenza from Nasopharyngeal swab specimens and should not be used as a sole basis for treatment. Nasal washings and aspirates are unacceptable for  Xpert Xpress SARS-CoV-2/FLU/RSV testing.  Fact Sheet for Patients: EntrepreneurPulse.com.au  Fact Sheet for Healthcare Providers: IncredibleEmployment.be  This test is not yet approved or cleared by the [place] FDA and has been authorized for detection and/or diagnosis of SARS-CoV-2 by FDA under an Emergency Use Authorization (EUA). This EUA will remain in effect (meaning this test can be used) for the duration of the COVID-19 declaration under Section 564(b)(1) of the Act, 21 U.S.C. section 360bbb-3(b)(1), unless the authorization is terminated or revoked.  Performed at [facility], [address] [place], [place] [address]   Gastrointestinal Panel by PCR , Stool     Status: Abnormal   Collection Time: 07/21/21  8:00 AM   Specimen: Stool  Result Value Ref Range Status   Campylobacter species NOT DETECTED NOT DETECTED Final   Plesimonas shigelloides NOT DETECTED NOT DETECTED Final   Salmonella species NOT DETECTED NOT DETECTED Final   Yersinia enterocolitica NOT DETECTED NOT DETECTED Final   Vibrio species NOT DETECTED NOT DETECTED Final   Vibrio cholerae NOT DETECTED NOT DETECTED Final   Enteroaggregative E coli (EAEC) NOT DETECTED NOT DETECTED Final   Enteropathogenic E coli (EPEC) NOT DETECTED NOT DETECTED Final   Enterotoxigenic E coli (ETEC) NOT DETECTED NOT DETECTED Final   Shiga like toxin producing E coli (STEC) NOT DETECTED NOT DETECTED Final   Shigella/Enteroinvasive E coli (EIEC) NOT DETECTED NOT DETECTED Final   Cryptosporidium NOT DETECTED NOT DETECTED Final   Cyclospora cayetanensis NOT DETECTED NOT DETECTED Final   Entamoeba histolytica NOT DETECTED NOT DETECTED Final   Giardia lamblia NOT DETECTED NOT DETECTED Final   Adenovirus F40/41 NOT DETECTED NOT DETECTED Final   Astrovirus NOT DETECTED NOT DETECTED Final   Norovirus GI/GII DETECTED (A) NOT DETECTED Final    Comment: RESULT CALLED TO, READ BACK BY AND VERIFIED  WITHMarline Backbone RN [phone] 07/22/21 HNM    Rotavirus A NOT DETECTED NOT DETECTED Final   Sapovirus (I, II, IV, and V) NOT DETECTED NOT DETECTED Final    Comment: Performed at [facility], [address], [place], [place] [address]  C Difficile Quick Screen w PCR reflex     Status: None  Collection Time: 07/21/21  9:44 AM   Specimen: STOOL  Result Value Ref Range Status   C Diff antigen NEGATIVE NEGATIVE Final   C Diff toxin NEGATIVE NEGATIVE Final   C Diff interpretation No C. difficile detected.  Final    Comment: Performed at [facility], [address], [place], [place] [address]    Today   Subjective    Teng Decou today has no further diarrhea, no emesis, tolerating oral intake well,  No fever  Or chills    Patient has been seen and examined prior to discharge   Objective   Blood pressure (!) 135/45, pulse 75, temperature 98.8 F (37.1 C), resp. rate 20, height 5\' 10"  (1.778 m), weight 70.3 kg, SpO2 95 %.   Intake/Output Summary (Last 24 hours) at 07/22/2021 1402 Last data filed at 07/22/2021 1300 Gross per 24 hour  Intake 2064.89 ml  Output 1250 ml  Net 814.89 ml    Exam Gen:- Awake Alert, no acute distress  HEENT:- [place].AT, No sclera icterus Neck-Supple Neck,No JVD,.  Lungs-improved air movement, no wheezing CV- S1, S2 normal, regular Abd-  +ve B.Sounds, Abd Soft, No tenderness,    Extremity/Skin:- No  edema,   good pulses Psych-affect is appropriate, oriented x3 Neuro-no new focal deficits, no tremors    Data Review   CBC w Diff:  Lab Results  Component Value Date   WBC 14.6 (H) 07/22/2021   HGB 12.1 (L) 07/22/2021   HCT 35.5 (L) 07/22/2021   PLT 292 07/22/2021   LYMPHOPCT 9 07/20/2021   MONOPCT 13 07/20/2021   EOSPCT 0 07/20/2021   BASOPCT 0 07/20/2021    CMP:  Lab Results  Component Value Date   NA 132 (L) 07/22/2021   K 3.2 (L) 07/22/2021   CL 103 07/22/2021   CO2 22 07/22/2021   BUN 8 07/22/2021   CREATININE 0.62 07/22/2021    PROT 6.8 07/20/2021   PROT 7.0 05/10/2017   ALBUMIN 3.0 (L) 07/20/2021   ALBUMIN 4.3 05/10/2017   BILITOT 0.3 07/20/2021   BILITOT 0.6 05/10/2017   ALKPHOS 97 07/20/2021   AST 20 07/20/2021   ALT 24 07/20/2021  .   Total Discharge time is about 33 minutes  Roxan Hockey M.D on 07/22/2021 at 2:02 PM  Go to [email or web address] -  for contact info  [facility] - Office  [phone]

## 2021-07-22 NOTE — Discharge Instructions (Signed)
1)Avoid ibuprofen/Advil/Aleve/Motrin/Goody Powders/Naproxen/BC powders/Meloxicam/Diclofenac/Indomethacin and other Nonsteroidal anti-inflammatory medications as these will make you more likely to bleed and can cause stomach ulcers, can also cause Kidney problems.   2)Repeat CBC and BMP blood test with the primary care physician in 1 week advised  3)Avoid dehydration, please drink plenty fluids

## 2021-07-22 NOTE — Evaluation (Signed)
Physical Therapy Evaluation Patient Details Name: Peter [name] MRN: [id number] DOB: 08-01-1932 Today's Date: 07/22/2021  History of Present Illness  Peter [name] is a 88 y.o. male with medical history significant for COPD, hypertension, dysphagia, peripheral and coronary artery disease.  Patient was brought to the ED via EMS reports of diarrhea of 1 week duration and weakness.  Patient has also had an ongoing productive cough for a while.  At time of my evaluation patient is awake alert oriented able to answer questions but daughter Peter [name] is at bedside and assist with the history.  Patient has been cough productive of thick phlegm since November.  He was evaluated by his outpatient provider and prescribed a course of Augmentin ( daughter says its amoxicillin) for presumed pneumonia which he had completed at least 4 weeks ago.  No difficulty breathing.  Cough is not particularly worse.  He has episodes of noisy breathing/wheezing.  He is has barely been mobile in the past 3 years.     Over the past week patient developed watery diarrhea, daughter reports multiple episodes every day of just pure water.  Patient has had swallowing problems for years and has had esophageal dilatation about 5 times.  He has been having difficulty swallowing which is chronic, and he is on a dysphagia diet that consist of very soft food and liquids.  No abdominal pain.  No vomiting.   Clinical Impression  Patient functioning at baseline for functional mobility and gait demonstrating good return for ambulation in room/hallway without loss of balance, limited mostly due to fatigue and states his caregivers will be able to take care of him at home.  Plan:  patient to be discharged from hospital today and discharged from physical therapy to care of nursing for ambulation daily as tolerated for length of stay.         Recommendations for follow up therapy are one component of a multi-disciplinary discharge planning process, led  by the attending physician.  Recommendations may be updated based on patient status, additional functional criteria and insurance authorization.  Follow Up Recommendations No PT follow up    Assistance Recommended at Discharge Set up Supervision/Assistance  Patient can return home with the following  A little help with walking and/or transfers;A little help with bathing/dressing/bathroom;Help with stairs or ramp for entrance    Equipment Recommendations None recommended by PT  Recommendations for Other Services       Functional Status Assessment Patient has had a recent decline in their functional status and/or demonstrates limited ability to make significant improvements in function in a reasonable and predictable amount of time     Precautions / Restrictions Precautions Precautions: Fall Precaution Comments: baseline right foot drop Restrictions Weight Bearing Restrictions: No      Mobility  Bed Mobility Overal bed mobility: Modified Independent             General bed mobility comments: slightly increased time with HOB flat    Transfers Overall transfer level: Needs assistance Equipment used: Rolling walker (2 wheels) Transfers: Sit to/from Stand;Bed to chair/wheelchair/BSC Sit to Stand: Supervision;Min guard   Step pivot transfers: Supervision;Min guard       General transfer comment: slightly labored movement for sit to stands and transfers    Ambulation/Gait Ambulation/Gait assistance: Supervision;Min guard Gait Distance (Feet): 35 Feet Assistive device: Rolling walker (2 wheels) Gait Pattern/deviations: Decreased step length - right;Decreased step length - left;Decreased stride length;Trunk flexed Gait velocity: decreased     General Gait  Details: slow labored cadence having to hike right hip due to foot drop, no loss of balance, limited mostly due to fatigue  Stairs            Wheelchair Mobility    Modified Rankin (Stroke Patients Only)        Balance Overall balance assessment: Needs assistance Sitting-balance support: Feet supported;No upper extremity supported Sitting balance-Leahy Scale: Good Sitting balance - Comments: seated at EOB   Standing balance support: Reliant on assistive device for balance;During functional activity;Bilateral upper extremity supported Standing balance-Leahy Scale: Fair Standing balance comment: using RW                             Pertinent Vitals/Pain Pain Assessment: No/denies pain    Home Living Family/patient expects to be discharged to:: Private residence Living Arrangements: Non-relatives/Friends Available Help at Discharge: Personal care attendant;Available 24 hours/day Type of Home: House Home Access: Stairs to enter Entrance Stairs-Rails: None Entrance Stairs-Number of Steps: 1   Home Layout: One level Home Equipment: Conservation officer, nature (2 wheels);Cane - single point;Grab bars - toilet;Grab bars - tub/shower;Wheelchair - manual      Prior Function Prior Level of Function : Needs assist       Physical Assist : Mobility (physical);ADLs (physical) Mobility (physical): Bed mobility;Transfers;Gait;Stairs   Mobility Comments: Assisted household ambulator using RW, uses right AFO PRN ADLs Comments: home aides 24/7 per patient     Hand Dominance   Dominant Hand: Right    Extremity/Trunk Assessment   Upper Extremity Assessment Upper Extremity Assessment: Overall WFL for tasks assessed    Lower Extremity Assessment Lower Extremity Assessment: Generalized weakness    Cervical / Trunk Assessment Cervical / Trunk Assessment: Kyphotic  Communication   Communication: No difficulties  Cognition Arousal/Alertness: Awake/alert Behavior During Therapy: WFL for tasks assessed/performed Overall Cognitive Status: Within Functional Limits for tasks assessed                                          General Comments      Exercises      Assessment/Plan    PT Assessment Patient does not need any further PT services  PT Problem List         PT Treatment Interventions      PT Goals (Current goals can be found in the Care Plan section)  Acute Rehab PT Goals Patient Stated Goal: return home with caregivers to assist PT Goal Formulation: With patient Time For Goal Achievement: 07/22/21 Potential to Achieve Goals: Good    Frequency       Co-evaluation               AM-PAC PT "6 Clicks" Mobility  Outcome Measure Help needed turning from your back to your side while in a flat bed without using bedrails?: None Help needed moving from lying on your back to sitting on the side of a flat bed without using bedrails?: None Help needed moving to and from a bed to a chair (including a wheelchair)?: A Little Help needed standing up from a chair using your arms (e.g., wheelchair or bedside chair)?: A Little Help needed to walk in hospital room?: A Little Help needed climbing 3-5 steps with a railing? : A Lot 6 Click Score: 19    End of Session   Activity Tolerance: Patient tolerated treatment  well;Patient limited by fatigue Patient left: in chair;with call bell/phone within reach Nurse Communication: Mobility status PT Visit Diagnosis: Other abnormalities of gait and mobility (R26.89);Unsteadiness on feet (R26.81);Muscle weakness (generalized) (M62.81)    Time: 8875-7972 PT Time Calculation (min) (ACUTE ONLY): 21 min   Charges:   PT Evaluation $PT Eval Moderate Complexity: 1 Mod PT Treatments $Therapeutic Activity: 8-22 mins        1:55 PM, 07/22/21 Lonell Grandchild, MPT Physical Therapist with [facility] [phone] office [phone] mobile phone

## 2021-07-25 ENCOUNTER — Inpatient Hospital Stay (HOSPITAL_COMMUNITY): Payer: Medicare Other

## 2021-07-25 ENCOUNTER — Inpatient Hospital Stay (HOSPITAL_COMMUNITY)
Admission: EM | Admit: 2021-07-25 | Discharge: 2021-07-27 | DRG: 871 | Disposition: A | Discharge status: Hospice | Payer: Medicare Other | Attending: Internal Medicine | Admitting: Internal Medicine

## 2021-07-25 ENCOUNTER — Emergency Department (HOSPITAL_COMMUNITY): Payer: Medicare Other

## 2021-07-25 ENCOUNTER — Other Ambulatory Visit: Payer: Self-pay

## 2021-07-25 ENCOUNTER — Encounter (HOSPITAL_COMMUNITY): Payer: Self-pay

## 2021-07-25 DIAGNOSIS — Z0189 Encounter for other specified special examinations: Secondary | ICD-10-CM

## 2021-07-25 DIAGNOSIS — A419 Sepsis, unspecified organism: Principal | ICD-10-CM | POA: Diagnosis present

## 2021-07-25 DIAGNOSIS — Z7189 Other specified counseling: Secondary | ICD-10-CM

## 2021-07-25 DIAGNOSIS — I10 Essential (primary) hypertension: Secondary | ICD-10-CM | POA: Diagnosis not present

## 2021-07-25 DIAGNOSIS — N179 Acute kidney failure, unspecified: Secondary | ICD-10-CM | POA: Diagnosis not present

## 2021-07-25 DIAGNOSIS — R918 Other nonspecific abnormal finding of lung field: Secondary | ICD-10-CM | POA: Diagnosis not present

## 2021-07-25 DIAGNOSIS — R1033 Periumbilical pain: Secondary | ICD-10-CM | POA: Diagnosis not present

## 2021-07-25 DIAGNOSIS — Z66 Do not resuscitate: Secondary | ICD-10-CM | POA: Diagnosis present

## 2021-07-25 DIAGNOSIS — I6523 Occlusion and stenosis of bilateral carotid arteries: Secondary | ICD-10-CM | POA: Diagnosis not present

## 2021-07-25 DIAGNOSIS — F419 Anxiety disorder, unspecified: Secondary | ICD-10-CM | POA: Diagnosis present

## 2021-07-25 DIAGNOSIS — R6521 Severe sepsis with septic shock: Secondary | ICD-10-CM | POA: Diagnosis present

## 2021-07-25 DIAGNOSIS — J441 Chronic obstructive pulmonary disease with (acute) exacerbation: Secondary | ICD-10-CM | POA: Diagnosis present

## 2021-07-25 DIAGNOSIS — J9601 Acute respiratory failure with hypoxia: Secondary | ICD-10-CM

## 2021-07-25 DIAGNOSIS — R6889 Other general symptoms and signs: Secondary | ICD-10-CM | POA: Diagnosis not present

## 2021-07-25 DIAGNOSIS — Z4682 Encounter for fitting and adjustment of non-vascular catheter: Secondary | ICD-10-CM | POA: Diagnosis not present

## 2021-07-25 DIAGNOSIS — Z515 Encounter for palliative care: Secondary | ICD-10-CM | POA: Diagnosis not present

## 2021-07-25 DIAGNOSIS — I499 Cardiac arrhythmia, unspecified: Secondary | ICD-10-CM | POA: Diagnosis not present

## 2021-07-25 DIAGNOSIS — K920 Hematemesis: Secondary | ICD-10-CM | POA: Diagnosis present

## 2021-07-25 DIAGNOSIS — I4892 Unspecified atrial flutter: Secondary | ICD-10-CM

## 2021-07-25 DIAGNOSIS — K55069 Acute infarction of intestine, part and extent unspecified: Secondary | ICD-10-CM | POA: Diagnosis not present

## 2021-07-25 DIAGNOSIS — J690 Pneumonitis due to inhalation of food and vomit: Secondary | ICD-10-CM | POA: Diagnosis not present

## 2021-07-25 DIAGNOSIS — R338 Other retention of urine: Secondary | ICD-10-CM | POA: Diagnosis not present

## 2021-07-25 DIAGNOSIS — I708 Atherosclerosis of other arteries: Secondary | ICD-10-CM | POA: Diagnosis not present

## 2021-07-25 DIAGNOSIS — Z8249 Family history of ischemic heart disease and other diseases of the circulatory system: Secondary | ICD-10-CM

## 2021-07-25 DIAGNOSIS — R1013 Epigastric pain: Secondary | ICD-10-CM

## 2021-07-25 DIAGNOSIS — Z20822 Contact with and (suspected) exposure to covid-19: Secondary | ICD-10-CM | POA: Diagnosis not present

## 2021-07-25 DIAGNOSIS — K219 Gastro-esophageal reflux disease without esophagitis: Secondary | ICD-10-CM | POA: Diagnosis present

## 2021-07-25 DIAGNOSIS — J189 Pneumonia, unspecified organism: Secondary | ICD-10-CM

## 2021-07-25 DIAGNOSIS — K760 Fatty (change of) liver, not elsewhere classified: Secondary | ICD-10-CM | POA: Diagnosis present

## 2021-07-25 DIAGNOSIS — E860 Dehydration: Secondary | ICD-10-CM | POA: Diagnosis not present

## 2021-07-25 DIAGNOSIS — R1110 Vomiting, unspecified: Secondary | ICD-10-CM | POA: Diagnosis not present

## 2021-07-25 DIAGNOSIS — N401 Enlarged prostate with lower urinary tract symptoms: Secondary | ICD-10-CM | POA: Diagnosis present

## 2021-07-25 DIAGNOSIS — Z743 Need for continuous supervision: Secondary | ICD-10-CM | POA: Diagnosis not present

## 2021-07-25 DIAGNOSIS — Z8673 Personal history of transient ischemic attack (TIA), and cerebral infarction without residual deficits: Secondary | ICD-10-CM | POA: Diagnosis not present

## 2021-07-25 DIAGNOSIS — I700 Atherosclerosis of aorta: Secondary | ICD-10-CM | POA: Diagnosis not present

## 2021-07-25 DIAGNOSIS — J849 Interstitial pulmonary disease, unspecified: Secondary | ICD-10-CM | POA: Diagnosis not present

## 2021-07-25 DIAGNOSIS — F17210 Nicotine dependence, cigarettes, uncomplicated: Secondary | ICD-10-CM | POA: Diagnosis present

## 2021-07-25 DIAGNOSIS — K559 Vascular disorder of intestine, unspecified: Secondary | ICD-10-CM

## 2021-07-25 DIAGNOSIS — I739 Peripheral vascular disease, unspecified: Secondary | ICD-10-CM | POA: Diagnosis present

## 2021-07-25 DIAGNOSIS — E8770 Fluid overload, unspecified: Secondary | ICD-10-CM | POA: Diagnosis not present

## 2021-07-25 DIAGNOSIS — R109 Unspecified abdominal pain: Secondary | ICD-10-CM | POA: Diagnosis not present

## 2021-07-25 DIAGNOSIS — D735 Infarction of spleen: Secondary | ICD-10-CM | POA: Diagnosis not present

## 2021-07-25 DIAGNOSIS — R7401 Elevation of levels of liver transaminase levels: Secondary | ICD-10-CM | POA: Diagnosis not present

## 2021-07-25 DIAGNOSIS — H9190 Unspecified hearing loss, unspecified ear: Secondary | ICD-10-CM | POA: Diagnosis present

## 2021-07-25 DIAGNOSIS — Z452 Encounter for adjustment and management of vascular access device: Secondary | ICD-10-CM | POA: Diagnosis not present

## 2021-07-25 DIAGNOSIS — Z823 Family history of stroke: Secondary | ICD-10-CM

## 2021-07-25 DIAGNOSIS — E869 Volume depletion, unspecified: Secondary | ICD-10-CM | POA: Diagnosis not present

## 2021-07-25 DIAGNOSIS — R531 Weakness: Secondary | ICD-10-CM | POA: Diagnosis not present

## 2021-07-25 DIAGNOSIS — K7200 Acute and subacute hepatic failure without coma: Secondary | ICD-10-CM | POA: Diagnosis not present

## 2021-07-25 DIAGNOSIS — I2510 Atherosclerotic heart disease of native coronary artery without angina pectoris: Secondary | ICD-10-CM | POA: Diagnosis present

## 2021-07-25 DIAGNOSIS — M1990 Unspecified osteoarthritis, unspecified site: Secondary | ICD-10-CM | POA: Diagnosis present

## 2021-07-25 DIAGNOSIS — K8020 Calculus of gallbladder without cholecystitis without obstruction: Secondary | ICD-10-CM | POA: Diagnosis present

## 2021-07-25 DIAGNOSIS — R000 Tachycardia, unspecified: Secondary | ICD-10-CM | POA: Diagnosis not present

## 2021-07-25 DIAGNOSIS — R0689 Other abnormalities of breathing: Secondary | ICD-10-CM | POA: Diagnosis not present

## 2021-07-25 LAB — ECHOCARDIOGRAM COMPLETE
Area-P 1/2: 3.82 cm2
Calc EF: 63.4 %
Height: 70 in
S' Lateral: 2.9 cm
Single Plane A2C EF: 61.1 %
Single Plane A4C EF: 65.3 %
Weight: 2564.39 oz

## 2021-07-25 LAB — COMPREHENSIVE METABOLIC PANEL
ALT: 52 U/L — ABNORMAL HIGH (ref 0–44)
AST: 60 U/L — ABNORMAL HIGH (ref 15–41)
Albumin: 3.1 g/dL — ABNORMAL LOW (ref 3.5–5.0)
Alkaline Phosphatase: 93 U/L (ref 38–126)
Anion gap: 15 (ref 5–15)
BUN: 15 mg/dL (ref 8–23)
CO2: 17 mmol/L — ABNORMAL LOW (ref 22–32)
Calcium: 10.1 mg/dL (ref 8.9–10.3)
Chloride: 105 mmol/L (ref 98–111)
Creatinine, Ser: 1.22 mg/dL (ref 0.61–1.24)
GFR, Estimated: 58 mL/min — ABNORMAL LOW (ref >60)
Glucose, Bld: 196 mg/dL — ABNORMAL HIGH (ref 70–99)
Potassium: 3.5 mmol/L (ref 3.5–5.1)
Sodium: 137 mmol/L (ref 135–145)
Total Bilirubin: 0.8 mg/dL (ref 0.3–1.2)
Total Protein: 7 g/dL (ref 6.5–8.1)

## 2021-07-25 LAB — BLOOD GAS, ARTERIAL
Acid-base deficit: 11.6 mmol/L — ABNORMAL HIGH (ref 0.0–2.0)
Bicarbonate: 15.6 mmol/L — ABNORMAL LOW (ref 20.0–28.0)
Drawn by: 22179
FIO2: 21
O2 Saturation: 91 %
Patient temperature: 36.6
pCO2 arterial: 26.7 mmHg — ABNORMAL LOW (ref 32.0–48.0)
pH, Arterial: 7.319 — ABNORMAL LOW (ref 7.350–7.450)
pO2, Arterial: 68.9 mmHg — ABNORMAL LOW (ref 83.0–108.0)

## 2021-07-25 LAB — BLOOD GAS, VENOUS
Acid-base deficit: 7.9 mmol/L — ABNORMAL HIGH (ref 0.0–2.0)
Bicarbonate: 18.1 mmol/L — ABNORMAL LOW (ref 20.0–28.0)
Drawn by: 27160
FIO2: 21
O2 Saturation: 77.9 %
Patient temperature: 36.4
pCO2, Ven: 28.9 mmHg — ABNORMAL LOW (ref 44.0–60.0)
pH, Ven: 7.37 (ref 7.250–7.430)
pO2, Ven: 44.4 mmHg (ref 32.0–45.0)

## 2021-07-25 LAB — URINALYSIS, MICROSCOPIC (REFLEX)

## 2021-07-25 LAB — URINALYSIS, ROUTINE W REFLEX MICROSCOPIC
Bilirubin Urine: NEGATIVE
Glucose, UA: NEGATIVE mg/dL
Ketones, ur: NEGATIVE mg/dL
Leukocytes,Ua: NEGATIVE
Nitrite: NEGATIVE
Protein, ur: 30 mg/dL — AB
Specific Gravity, Urine: 1.025 (ref 1.005–1.030)
pH: 6 (ref 5.0–8.0)

## 2021-07-25 LAB — CBC WITH DIFFERENTIAL/PLATELET
Band Neutrophils: 5 %
Basophils Absolute: 0 10*3/uL (ref 0.0–0.1)
Basophils Relative: 0 %
Eosinophils Absolute: 0 10*3/uL (ref 0.0–0.5)
Eosinophils Relative: 0 %
HCT: 46 % (ref 39.0–52.0)
Hemoglobin: 15.4 g/dL (ref 13.0–17.0)
Lymphocytes Relative: 4 %
Lymphs Abs: 1.4 10*3/uL (ref 0.7–4.0)
MCH: 33 pg (ref 26.0–34.0)
MCHC: 33.5 g/dL (ref 30.0–36.0)
MCV: 98.5 fL (ref 80.0–100.0)
Monocytes Absolute: 1 10*3/uL (ref 0.1–1.0)
Monocytes Relative: 3 %
Neutro Abs: 32.1 10*3/uL — ABNORMAL HIGH (ref 1.7–7.7)
Neutrophils Relative %: 88 %
Platelets: 422 10*3/uL — ABNORMAL HIGH (ref 150–400)
RBC: 4.67 MIL/uL (ref 4.22–5.81)
RDW: 12.5 % (ref 11.5–15.5)
WBC: 34.5 10*3/uL — ABNORMAL HIGH (ref 4.0–10.5)
nRBC: 0 % (ref 0.0–0.2)

## 2021-07-25 LAB — PROCALCITONIN: Procalcitonin: 1.07 ng/mL

## 2021-07-25 LAB — POC OCCULT BLOOD, ED: Fecal Occult Bld: POSITIVE — AB

## 2021-07-25 LAB — PROTIME-INR
INR: 1.2 (ref 0.8–1.2)
Prothrombin Time: 15.5 seconds — ABNORMAL HIGH (ref 11.4–15.2)

## 2021-07-25 LAB — RESP PANEL BY RT-PCR (FLU A&B, COVID) ARPGX2
Influenza A by PCR: NEGATIVE
Influenza B by PCR: NEGATIVE
SARS Coronavirus 2 by RT PCR: NEGATIVE

## 2021-07-25 LAB — LACTIC ACID, PLASMA
Lactic Acid, Venous: 7.9 mmol/L (ref 0.5–1.9)
Lactic Acid, Venous: 8.1 mmol/L (ref 0.5–1.9)
Lactic Acid, Venous: 8 mmol/L (ref 0.5–1.9)

## 2021-07-25 LAB — APTT: aPTT: 27 seconds (ref 24–36)

## 2021-07-25 LAB — TYPE AND SCREEN
ABO/RH(D): O POS
Antibody Screen: NEGATIVE

## 2021-07-25 LAB — MRSA NEXT GEN BY PCR, NASAL: MRSA by PCR Next Gen: NOT DETECTED

## 2021-07-25 MED ORDER — SODIUM CHLORIDE 0.9 % IV BOLUS
1000.0000 mL | Freq: Once | INTRAVENOUS | Status: AC
  Administered 2021-07-25: 1000 mL via INTRAVENOUS

## 2021-07-25 MED ORDER — LORAZEPAM 2 MG/ML IJ SOLN
0.2500 mg | Freq: Once | INTRAMUSCULAR | Status: AC
  Administered 2021-07-25: 0.25 mg via INTRAVENOUS
  Filled 2021-07-25: qty 1

## 2021-07-25 MED ORDER — ASPIRIN EC 81 MG PO TBEC
81.0000 mg | DELAYED_RELEASE_TABLET | Freq: Every day | ORAL | Status: DC
  Administered 2021-07-25: 81 mg via ORAL
  Filled 2021-07-25: qty 1

## 2021-07-25 MED ORDER — IPRATROPIUM-ALBUTEROL 0.5-2.5 (3) MG/3ML IN SOLN: 3.0000 mL | Freq: Four times a day (QID) | RESPIRATORY_TRACT | Status: DC

## 2021-07-25 MED ORDER — LACTATED RINGERS IV SOLN: INTRAVENOUS | Status: AC

## 2021-07-25 MED ORDER — ONDANSETRON HCL 4 MG/2ML IJ SOLN
4.0000 mg | Freq: Four times a day (QID) | INTRAMUSCULAR | Status: DC | PRN
Start: 2021-07-25 — End: 2021-07-27
  Administered 2021-07-26: 4 mg via INTRAVENOUS
  Filled 2021-07-25: qty 2

## 2021-07-25 MED ORDER — LACTATED RINGERS IV BOLUS (SEPSIS)
1000.0000 mL | Freq: Once | INTRAVENOUS | Status: AC
  Administered 2021-07-25: 1000 mL via INTRAVENOUS

## 2021-07-25 MED ORDER — FINASTERIDE 5 MG PO TABS
5.0000 mg | ORAL_TABLET | Freq: Every day | ORAL | Status: DC
  Administered 2021-07-25: 5 mg via ORAL
  Filled 2021-07-25: qty 1

## 2021-07-25 MED ORDER — CHLORHEXIDINE GLUCONATE CLOTH 2 % EX PADS
6.0000 | MEDICATED_PAD | Freq: Every day | CUTANEOUS | Status: DC
  Administered 2021-07-25 – 2021-07-26 (×2): 6 via TOPICAL

## 2021-07-25 MED ORDER — NICOTINE 21 MG/24HR TD PT24
21.0000 mg | MEDICATED_PATCH | Freq: Every day | TRANSDERMAL | Status: DC
  Administered 2021-07-26 – 2021-07-27 (×2): 21 mg via TRANSDERMAL
  Filled 2021-07-25 (×2): qty 1

## 2021-07-25 MED ORDER — BUDESONIDE 0.5 MG/2ML IN SUSP: 0.5000 mg | Freq: Two times a day (BID) | RESPIRATORY_TRACT | Status: DC

## 2021-07-25 MED ORDER — PANTOPRAZOLE SODIUM 40 MG IV SOLR
40.0000 mg | Freq: Two times a day (BID) | INTRAVENOUS | Status: DC
  Administered 2021-07-25 (×2): 40 mg via INTRAVENOUS
  Filled 2021-07-25 (×2): qty 40

## 2021-07-25 MED ORDER — ONDANSETRON HCL 4 MG PO TABS: 4.0000 mg | ORAL_TABLET | Freq: Four times a day (QID) | ORAL | Status: DC | PRN

## 2021-07-25 MED ORDER — BUDESONIDE 0.5 MG/2ML IN SUSP
0.5000 mg | Freq: Two times a day (BID) | RESPIRATORY_TRACT | Status: DC
  Administered 2021-07-25 – 2021-07-27 (×5): 0.5 mg via RESPIRATORY_TRACT
  Filled 2021-07-25 (×5): qty 2

## 2021-07-25 MED ORDER — VANCOMYCIN HCL 1500 MG/300ML IV SOLN
1500.0000 mg | Freq: Once | INTRAVENOUS | Status: AC
  Administered 2021-07-25: 1500 mg via INTRAVENOUS
  Filled 2021-07-25: qty 300

## 2021-07-25 MED ORDER — ALPRAZOLAM 0.5 MG PO TABS
0.5000 mg | ORAL_TABLET | Freq: Two times a day (BID) | ORAL | Status: DC | PRN
  Administered 2021-07-27: 0.5 mg via ORAL
  Filled 2021-07-25: qty 1

## 2021-07-25 MED ORDER — PIPERACILLIN-TAZOBACTAM 3.375 G IVPB
3.3750 g | Freq: Three times a day (TID) | INTRAVENOUS | Status: DC
  Administered 2021-07-25 – 2021-07-27 (×8): 3.375 g via INTRAVENOUS
  Filled 2021-07-25 (×8): qty 50

## 2021-07-25 MED ORDER — METOPROLOL TARTRATE 5 MG/5ML IV SOLN
5.0000 mg | Freq: Once | INTRAVENOUS | Status: AC
  Administered 2021-07-25: 5 mg via INTRAVENOUS
  Filled 2021-07-25: qty 5

## 2021-07-25 MED ORDER — METHYLPREDNISOLONE SODIUM SUCC 125 MG IJ SOLR
60.0000 mg | Freq: Two times a day (BID) | INTRAMUSCULAR | Status: DC
  Administered 2021-07-25 – 2021-07-27 (×5): 60 mg via INTRAVENOUS
  Filled 2021-07-25 (×5): qty 2

## 2021-07-25 MED ORDER — LACTATED RINGERS IV BOLUS (SEPSIS)
250.0000 mL | Freq: Once | INTRAVENOUS | Status: AC
  Administered 2021-07-25: 250 mL via INTRAVENOUS

## 2021-07-25 MED ORDER — LEVOFLOXACIN IN D5W 750 MG/150ML IV SOLN: 750.0000 mg | INTRAVENOUS | Status: DC

## 2021-07-25 MED ORDER — VANCOMYCIN HCL IN DEXTROSE 1-5 GM/200ML-% IV SOLN: 1000.0000 mg | INTRAVENOUS | Status: DC

## 2021-07-25 MED ORDER — ACETAMINOPHEN 325 MG PO TABS: 650.0000 mg | ORAL_TABLET | Freq: Four times a day (QID) | ORAL | Status: DC | PRN

## 2021-07-25 MED ORDER — LABETALOL HCL 5 MG/ML IV SOLN
5.0000 mg | Freq: Four times a day (QID) | INTRAVENOUS | Status: DC | PRN
  Administered 2021-07-26 (×4): 5 mg via INTRAVENOUS
  Filled 2021-07-25 (×5): qty 4

## 2021-07-25 MED ORDER — IPRATROPIUM-ALBUTEROL 0.5-2.5 (3) MG/3ML IN SOLN
3.0000 mL | Freq: Four times a day (QID) | RESPIRATORY_TRACT | Status: DC
  Administered 2021-07-25 – 2021-07-27 (×9): 3 mL via RESPIRATORY_TRACT
  Filled 2021-07-25 (×9): qty 3

## 2021-07-25 MED ORDER — LEVOFLOXACIN IN D5W 750 MG/150ML IV SOLN
750.0000 mg | Freq: Once | INTRAVENOUS | Status: AC
  Administered 2021-07-25: 750 mg via INTRAVENOUS
  Filled 2021-07-25: qty 150

## 2021-07-25 MED ORDER — ACETAMINOPHEN 650 MG RE SUPP
650.0000 mg | Freq: Once | RECTAL | Status: AC
  Administered 2021-07-25: 650 mg via RECTAL
  Filled 2021-07-25: qty 1

## 2021-07-25 MED ORDER — ACETAMINOPHEN 650 MG RE SUPP
650.0000 mg | Freq: Four times a day (QID) | RECTAL | Status: DC | PRN
  Administered 2021-07-26 – 2021-07-27 (×2): 650 mg via RECTAL
  Filled 2021-07-25 (×2): qty 1

## 2021-07-25 MED ORDER — ENOXAPARIN SODIUM 40 MG/0.4ML IJ SOSY
40.0000 mg | PREFILLED_SYRINGE | INTRAMUSCULAR | Status: DC
  Administered 2021-07-25: 40 mg via SUBCUTANEOUS
  Filled 2021-07-25: qty 0.4

## 2021-07-25 NOTE — ED Notes (Signed)
Date and time results received: 07/25/21 0543 (use smartphrase ".now" to insert current time)  Test: Lactic Acid Critical Value: 7.9  Name of Provider Notified: D. Christy Gentles, MD

## 2021-07-25 NOTE — ED Triage Notes (Signed)
Pt arrives by [facility] from home with c/o sob, vomiting, diarrhea, and sob. Pt reports symptoms started last night, pt has had numerous episodes of diarrhea and vomiting "dark brown/black stuff"

## 2021-07-25 NOTE — ED Provider Notes (Signed)
[facility] EMERGENCY DEPARTMENT Provider Note   CSN: [id number] Arrival date & time: 07/25/21  0440     History  Chief Complaint  Patient presents with   Code Sepsis   Level 5 caveat due to acuity of condition Peter [name] is a 88 y.o. male.  The history is provided by the patient and the EMS personnel.  Weakness Severity:  Severe Onset quality:  Gradual Timing:  Constant Progression:  Worsening Chronicity:  New Relieved by:  Nothing Worsened by:  Nothing Associated symptoms: cough and diarrhea   EMS reports they were called out to the house for generalized weakness.  Patient was found sitting on the floor reporting generalized weakness.  He is also been having diarrhea and nausea.  He has been coughing up some "black stuff " EMS reports the patient was hypotensive, tachycardic and hypoxic on room air.  This improved with a nonrebreather Patient is not typically on oxygen   Patient with recent admission for diarrhea and aspiration pneumonia Patient discharged on January 12.  Patient has a history of PAD, esophageal dysphagia, hypertension, hyponatremia Patient recently completed antibiotics for pneumonia. Home Medications Prior to Admission medications   Medication Sig Start Date End Date Taking? Authorizing Provider  acetaminophen (TYLENOL) 325 MG tablet Take 2 tablets (650 mg total) by mouth every 6 (six) hours as needed for mild pain, fever or headache (or Fever >/= 101). 07/22/21   Roxan Hockey, MD  albuterol (VENTOLIN HFA) 108 (90 Base) MCG/ACT inhaler Inhale 1-2 puffs into the lungs every 6 (six) hours as needed for wheezing or shortness of breath. 07/22/21   Roxan Hockey, MD  ALPRAZolam Duanne Moron) 0.5 MG tablet Take 0.5 mg by mouth 2 (two) times daily as needed for anxiety.    [provider]  amLODipine (NORVASC) 5 MG tablet Take 5 mg by mouth daily.  11/28/16   [provider]  amoxicillin-clavulanate (AUGMENTIN) 875-125 MG tablet Take 1 tablet  by mouth 2 (two) times daily for 5 days. 07/22/21 07/27/21  Roxan Hockey, MD  aspirin EC 81 MG EC tablet Take 1 tablet (81 mg total) by mouth daily with breakfast. 07/22/21   Roxan Hockey, MD  finasteride (PROSCAR) 5 MG tablet Take 1 tablet (5 mg total) by mouth daily after lunch. 07/22/21   Roxan Hockey, MD  lisinopril (ZESTRIL) 40 MG tablet Take 1 tablet (40 mg total) by mouth daily. 07/22/21   Roxan Hockey, MD  mometasone-formoterol (DULERA) 200-5 MCG/ACT AERO Inhale 2 puffs into the lungs 2 (two) times daily. 07/22/21   Roxan Hockey, MD  ondansetron (ZOFRAN) 4 MG tablet Take 1 tablet (4 mg total) by mouth every 6 (six) hours as needed for nausea. 07/22/21   Roxan Hockey, MD  pantoprazole (PROTONIX) 40 MG tablet Take 1 tablet (40 mg total) by mouth daily. TAKE (1) TABLET BY MOUTH ONCE DAILY BEFORE BREAKFAST. Strength: 40 mg 07/22/21   Roxan Hockey, MD  polyethylene glycol (MIRALAX / GLYCOLAX) 17 g packet Take 17 g by mouth daily as needed for mild constipation. 07/22/21   Roxan Hockey, MD  Potassium Chloride ER 20 MEQ TBCR Take 20 mEq by mouth daily. 1 tab daily by mouth 07/22/21   Roxan Hockey, MD      Allergies    Patient has no known allergies.    Review of Systems   Review of Systems  Unable to perform ROS: Acuity of condition  Respiratory:  Positive for cough.   Gastrointestinal:  Positive for diarrhea.  Neurological:  Positive for weakness.   Physical Exam Updated Vital Signs BP 117/77    Pulse (!) 53    Temp (!) 101.9 F (38.8 C) (Rectal)    Resp (!) 40    Ht 1.778 m (5\' 10" )    Wt 70.3 kg    SpO2 91%    BMI 22.23 kg/m  Physical Exam CONSTITUTIONAL: Elderly and ill-appearing HEAD: Normocephalic/atraumatic EYES: EOMI/PERRL ENMT: Mucous membranes dry, dark material in his mouth NECK: supple no meningeal signs SPINE/BACK:entire spine nontender CV: Tachycardic LUNGS: Tachypnea, coarse breath sounds bilaterally ABDOMEN: soft, nontender,  nondistended NEURO: Pt is awake/alert/appropriate, moves all extremitiesx4.  No facial droop.   EXTREMITIES: pulses normal/equal, full ROM SKIN: warm, color normal PSYCH: Anxious  ED Results / Procedures / Treatments   Labs (all labs ordered are listed, but only abnormal results are displayed) Labs Reviewed  LACTIC ACID, PLASMA - Abnormal; Notable for the following components:      Result Value   Lactic Acid, Venous 7.9 (*)    All other components within normal limits  COMPREHENSIVE METABOLIC PANEL - Abnormal; Notable for the following components:   CO2 17 (*)    Glucose, Bld 196 (*)    Albumin 3.1 (*)    AST 60 (*)    ALT 52 (*)    GFR, Estimated 58 (*)    All other components within normal limits  CBC WITH DIFFERENTIAL/PLATELET - Abnormal; Notable for the following components:   WBC 34.5 (*)    Platelets 422 (*)    Neutro Abs 32.1 (*)    All other components within normal limits  PROTIME-INR - Abnormal; Notable for the following components:   Prothrombin Time 15.5 (*)    All other components within normal limits  URINALYSIS, ROUTINE W REFLEX MICROSCOPIC - Abnormal; Notable for the following components:   Color, Urine AMBER (*)    Hgb urine dipstick TRACE (*)    Protein, ur 30 (*)    All other components within normal limits  URINALYSIS, MICROSCOPIC (REFLEX) - Abnormal; Notable for the following components:   Bacteria, UA FEW (*)    All other components within normal limits  POC OCCULT BLOOD, ED - Abnormal; Notable for the following components:   Fecal Occult Bld POSITIVE (*)    All other components within normal limits  RESP PANEL BY RT-PCR (FLU A&B, COVID) ARPGX2  CULTURE, BLOOD (ROUTINE X 2)  CULTURE, BLOOD (ROUTINE X 2)  APTT  LACTIC ACID, PLASMA  BLOOD GAS, VENOUS  TYPE AND SCREEN    EKG EKG Interpretation  Date/Time:  Sunday July 25 2021 05:09:40 EST Ventricular Rate:  158 PR Interval:    QRS Duration: 84 QT Interval:  265 QTC Calculation: 446 R  Axis:   0 Text Interpretation: atrial flutter, but limited due to artifact Markedly posterior QRS axis Repolarization abnormality, prob rate related Interpretation limited secondary to artifact Confirmed by Ripley Fraise [phone]) on 07/25/2021 5:13:10 AM  Radiology DG Chest Port 1 View  Result Date: 07/25/2021 CLINICAL DATA:  Questionable sepsis EXAM: PORTABLE CHEST 1 VIEW COMPARISON:  Five days ago FINDINGS: New airspace disease on the left. Clear right lung. Normal heart size and mediastinal contours. Extensive artifact from EKG leads. IMPRESSION: New, extensive pneumonia on the left. Electronically Signed   By: Jorje Guild M.D.   On: 07/25/2021 05:27    Procedures .Critical Care Performed by: Ripley Fraise, MD Authorized by: Ripley Fraise, MD   Critical care provider statement:    Critical care time (  minutes):  120   Critical care start time:  07/25/2021 5:06 AM   Critical care end time:  07/25/2021 7:06 AM   Critical care time was exclusive of:  Separately billable procedures and treating other patients   Critical care was necessary to treat or prevent imminent or life-threatening deterioration of the following conditions:  Respiratory failure, sepsis, shock, dehydration and metabolic crisis   Critical care was time spent personally by me on the following activities:  Development of treatment plan with patient or surrogate, discussions with consultants, examination of patient, evaluation of patient's response to treatment, pulse oximetry, ordering and review of radiographic studies, ordering and review of laboratory studies, re-evaluation of patient's condition, review of old charts, ordering and performing treatments and interventions and obtaining history from patient or surrogate   I assumed direction of critical care for this patient from another provider in my specialty: no     Care discussed with: admitting provider      Medications Ordered in ED Medications  lactated  ringers infusion ( Intravenous New Bag/Given 07/25/21 0544)  levofloxacin (LEVAQUIN) IVPB 750 mg (has no administration in time range)  acetaminophen (TYLENOL) suppository 650 mg (650 mg Rectal Given 07/25/21 0519)  lactated ringers bolus 1,000 mL (0 mLs Intravenous Stopped 07/25/21 0657)    And  lactated ringers bolus 1,000 mL (1,000 mLs Intravenous New Bag/Given 07/25/21 0552)    And  lactated ringers bolus 250 mL (0 mLs Intravenous Stopped 07/25/21 0612)  levofloxacin (LEVAQUIN) IVPB 750 mg (750 mg Intravenous New Bag/Given 07/25/21 0541)    ED Course/ Medical Decision Making/ A&P Clinical Course as of 07/25/21 0718  Sun Jul 25, 2021  0514 Fecal Occult Blood, POC(!): POSITIVE Positive Hemoccult, but stool was brown [DW]  0524 Code sepsis has been called on this patient.  Patient is critically ill.  We will start IV antibiotics for presumed pneumonia due to his respiratory status.  He was discharged home with Augmentin, therefore we will proceed with Levaquin.  IV fluids been ordered [DW]  0616 CO2(!): 17 Dehydration noted [DW]  0616 WBC(!): 34.5 Leukocytosis noted [DW]  0616 Lactic Acid, Venous(!!): 7.9 Lactic acidosis [DW]  0616 Daughter is at bedside.  She reports patient has had increasing weakness.  He has completed about 4 doses of the Augmentin.  Patient reported he had profuse watery diarrhea. [DW]  0617 Overall patient does appear to be improving.  His blood pressure is maintaining and his work of breathing is improving, and he does not require oxygen at this time Lactate was elevated, though this could be due to profuse diarrhea. [DW]  0617 his bicarb is only minimal depressed.  We will continue to monitor. [DW]  [phone] Patient continues to improve.  Heart rate is in the 130s He is maintaining his pulse ox and is in no distress [DW]  0718 Discussed with Dr. Carles Collet  for admission to the hospital [DW]    Clinical Course User Index [DW] Ripley Fraise, MD       CHA2DS2-VASc Score:  4                    Medical Decision Making  This patient presents to the ED for concern of weakness and diarrhea, this involves an extensive number of treatment options, and is a complaint that carries with it a high risk of complications and morbidity.  The differential diagnosis includes gastroenteritis, sepsis, electrolyte abnormality  Comorbidities that complicate the patient evaluation: Patients presentation is complicated by  their history of recent admission, frailty   Additional history obtained: Additional history obtained from EMS discussed with EMS at bedside Records reviewed previous admission documents  Lab Tests: I Ordered, and personally interpreted labs.  The pertinent results include: Leukocytosis, lactic acidosis  Imaging Studies ordered: I ordered imaging studies including X-ray chest I independently visualized and interpreted imaging which showed left lung pneumonia I agree with the radiologist interpretation  Cardiac Monitoring: The patient was maintained on a cardiac monitor.  I personally viewed and interpreted the cardiac monitor which showed an underlying rhythm of:  Atrial Flutter  Medicines ordered and prescription drug management: I ordered medication including IV fluids and IV antibiotics for sepsis Reevaluation of the patient after these medicines showed that the patient    improved   Critical Interventions:       IV fluids and antibiotics for sepsis  Consultations Obtained: I requested consultation with the admitting physician Dr Tat, and discussed  findings as well as pertinent plan - they recommend: admission  Reevaluation: After the interventions noted above, I reevaluated the patient and found that they have :improved  Suspect lactic acidosis will improve after IV fluids, this is likely exacerbated by diarrhea  Complexity of problems addressed: Patients presentation is most consistent with  acute presentation with potential threat to  life or bodily function      Disposition: After consideration of the diagnostic results and the patients response to treatment,  I feel that the patent would benefit from admission  .           Final Clinical Impression(s) / ED Diagnoses Final diagnoses:  Acute respiratory failure with hypoxia ([place])  Community acquired pneumonia of left lung, unspecified part of lung    Rx / DC Orders ED Discharge Orders     None         Ripley Fraise, MD 07/25/21 [phone]

## 2021-07-25 NOTE — Evaluation (Signed)
Clinical/Bedside Swallow Evaluation Patient Details  Name: Peter [name] MRN: [id number] Date of Birth: January 08, 1933  Today's Date: 07/25/2021 Time: SLP Start Time (ACUTE ONLY): 18 SLP Stop Time (ACUTE ONLY): 4580 SLP Time Calculation (min) (ACUTE ONLY): 23 min  Past Medical History:  Past Medical History:  Diagnosis Date   Anxiety    Arthritis    BPH (benign prostatic hyperplasia)    Chronic pain    Complication of anesthesia    had an anxiety attack once   COPD (chronic obstructive pulmonary disease) (HCC)    Depression    GERD (gastroesophageal reflux disease)    Hiatal hernia    small   History of nuclear stress test    Nuc 5/19: soft tissue attenuation; no ischemia or scar, EF 72; Low Risk   HTN (hypertension)    Internal carotid artery stenosis, left    Pneumonia    Prostate disease    h/o elevated PSA, neg bx, [facility]   Sciatica    Wears glasses    Wears partial dentures    Past Surgical History:  Past Surgical History:  Procedure Laterality Date   ABDOMINAL AORTOGRAM W/LOWER EXTREMITY N/A 10/26/2017   Procedure: ABDOMINAL AORTOGRAM W/LOWER EXTREMITY;  Surgeon: Waynetta Sandy, MD;  Location: [facility];  Service: Cardiovascular;  Laterality: N/A;  bilateral   BALLOON DILATION  04/23/2018   Procedure: BALLOON DILATION;  Surgeon: Daneil Dolin, MD;  Location: [facility];  Service: Endoscopy;;  esophagus   BIOPSY  01/29/2014   Procedure: BIOPSY;  Surgeon: Daneil Dolin, MD;  Location: [facility];  Service: Endoscopy;;  Gastric and Esophageal   BIOPSY  06/14/2018   Procedure: BIOPSY;  Surgeon: Daneil Dolin, MD;  [facility]: [facility];  Service: Endoscopy;;  esophagus   CAROTID ENDARTERECTOMY Left 03/20/2017   CATARACT EXTRACTION W/PHACO  05/21/2012   Procedure: CATARACT EXTRACTION PHACO AND INTRAOCULAR LENS PLACEMENT ([place]);  Surgeon: Tonny Branch, MD;  Location: [facility];  Service: Ophthalmology;  Laterality: Right;  CDE 19.34    COLONOSCOPY  12/2004   diverticulosis, hyperplastic polyps, hemorrhoids   ENDARTERECTOMY Left 03/20/2017   Procedure: ENDARTERECTOMY CAROTID-LEFT;  Surgeon: Conrad Shelby, MD;  Location: [place];  Service: Vascular;  Laterality: Left;   ESOPHAGOGASTRODUODENOSCOPY  2003   schatzki ring, small vascular abnormality of duodenal bulb ?Dieulafoy's lesion (ablated)   ESOPHAGOGASTRODUODENOSCOPY  11/10/10   Dr. Gala Romney dilation 2 rings with 21F   ESOPHAGOGASTRODUODENOSCOPY N/A 01/29/2014   Dr.Rourk- multiple esophageal rings/webs- s/p dialation. small hiatal hernia. abnormal gastric mucosa. bx= mild chronic inactive gastritis (oxyntic mucosa), squamous mucosa with epithelial changes consistent with reflux related injury.   ESOPHAGOGASTRODUODENOSCOPY (EGD) WITH PROPOFOL N/A 04/23/2018   Procedure: ESOPHAGOGASTRODUODENOSCOPY (EGD) WITH PROPOFOL;  Surgeon: Daneil Dolin, MD;  [facility];  Service: Endoscopy;  Laterality: N/A;  2:00pm   ESOPHAGOGASTRODUODENOSCOPY (EGD) WITH PROPOFOL N/A 06/14/2018   Dr. Gala Romney: Obstructing Schatzki ring, status post dilation medium-sized hiatal hernia, biopsies from the mid to distal esophagus obtained which were benign.   MALONEY DILATION N/A 06/14/2018   Procedure: Venia Minks DILATION;  Surgeon: Daneil Dolin, MD;  [facility];  Service: Endoscopy;  Laterality: N/A;   MULTIPLE TOOTH EXTRACTIONS     PATCH ANGIOPLASTY Left 03/20/2017   PATCH ANGIOPLASTY Left 03/20/2017   Procedure: PATCH ANGIOPLASTY;  Surgeon: Conrad Cherry Log, MD;  Location: [place];  Service: Vascular;  Laterality: Left;   HPI:  Peter [name] is a  88 y.o. male with medical history of COPD, tobacco abuse, dysphagia, peripheral arterial disease, coronary disease presenting with diarrhea, coughing, shortness of breath.  Patient was recently discharged from the hospital after a stay from 07/20/21 to 07/22/21 when he was treated for his diarrhea and aspiration pneumonia.  The patient was discharged  home with Augmentin.  He states that he has taken about 4 doses of Augmentin since discharge from the hospital.  His C. difficile assay was negative at that time.  The patient states that he was feeling well at the time of discharge without any shortness of breath, and his diarrhea had subsided.  However, on 07/24/2021, he began having increasing shortness of breath and worsening diarrhea.  He states that he had up to 10 loose bowel movements without any blood or black tarry stool.  He has had some nausea but without any emesis.  He has had some "spit up" but denies any hematemesis.  However, he states that he has been spitting up some "dark stuff".  He denies any abdominal pain but has had some intermittent cramping.  In the early morning 07/25/2021, the patient was getting up to go to the bathroom when he had a near syncopal episode with increasing shortness of breath.  As result EMS was activated.Chest x-ray showed left-sided infiltrate.    Assessment / Plan / Recommendation  Clinical Impression  Pt known to this SLP from admission last week. He has known esophageal dysphagia. BSE completed last week with recommendation for D3/thin. Pt looks similar to when seen last week, however with increased shortness of breath and congestion. Suction completed, but unable to remove. Pt alert and oriented. Pt took a few sips of thin water via straw sips without immediate signs or symptoms of aspiration, however did have a delayed congested cough (present before trials as well). Pt declined food trials. Recommend NPO except for small sips of water and ok for po medication whole with water, oral care, and SLP to see again in the AM and likely complete MBSS if Pt is appropriate from a respiratory standpoint. Above to RN. SLP Visit Diagnosis: Dysphagia, unspecified (R13.10)    Aspiration Risk  Mild aspiration risk;Risk for inadequate nutrition/hydration    Diet Recommendation NPO except meds;Ice chips PRN after oral  care;Free water protocol after oral care   Liquid Administration via: Cup;Straw Medication Administration: Whole meds with liquid Compensations: Small sips/bites;Slow rate Postural Changes: Seated upright at 90 degrees;Remain upright for at least 30 minutes after po intake    Other  Recommendations Recommended Consults: Consider esophageal assessment Oral Care Recommendations: Oral care prior to ice chip/H20;Staff/trained caregiver to provide oral care    Recommendations for follow up therapy are one component of a multi-disciplinary discharge planning process, led by the attending physician.  Recommendations may be updated based on patient status, additional functional criteria and insurance authorization.  Follow up Recommendations  (pending)      Assistance Recommended at Discharge Intermittent Supervision/Assistance  Functional Status Assessment Patient has had a recent decline in their functional status and demonstrates the ability to make significant improvements in function in a reasonable and predictable amount of time.  Frequency and Duration min 2x/week  1 week       Prognosis Prognosis for Safe Diet Advancement: Good Barriers/Prognosis Comment: h/o esophageal dysphagia, deconditioning      Swallow Study   General Date of Onset: 07/25/21 HPI: DAMIEL BARTHOLD is a 88 y.o. male with medical history of COPD, tobacco abuse, dysphagia, peripheral  arterial disease, coronary disease presenting with diarrhea, coughing, shortness of breath.  Patient was recently discharged from the hospital after a stay from 07/20/21 to 07/22/21 when he was treated for his diarrhea and aspiration pneumonia.  The patient was discharged home with Augmentin.  He states that he has taken about 4 doses of Augmentin since discharge from the hospital.  His C. difficile assay was negative at that time.  The patient states that he was feeling well at the time of discharge without any shortness of breath, and his  diarrhea had subsided.  However, on 07/24/2021, he began having increasing shortness of breath and worsening diarrhea.  He states that he had up to 10 loose bowel movements without any blood or black tarry stool.  He has had some nausea but without any emesis.  He has had some "spit up" but denies any hematemesis.  However, he states that he has been spitting up some "dark stuff".  He denies any abdominal pain but has had some intermittent cramping.  In the early morning 07/25/2021, the patient was getting up to go to the bathroom when he had a near syncopal episode with increasing shortness of breath.  As result EMS was activated.Chest x-ray showed left-sided infiltrate. Type of Study: Bedside Swallow Evaluation Previous Swallow Assessment: BSE last week, D3/thin, h/o esophageal dysphagia Diet Prior to this Study: Regular;Thin liquids Temperature Spikes Noted: No Respiratory Status: Room air History of Recent Intubation: No Behavior/Cognition: Alert;Cooperative;Pleasant mood Oral Cavity Assessment: Within Functional Limits Oral Care Completed by SLP: Yes Oral Cavity - Dentition: Missing dentition Vision: Functional for self-feeding Self-Feeding Abilities: Able to feed self Patient Positioning: Upright in bed Baseline Vocal Quality: Normal Volitional Cough: Strong;Congested Volitional Swallow: Able to elicit    Oral/Motor/Sensory Function Overall Oral Motor/Sensory Function: Within functional limits   Ice Chips Ice chips: Within functional limits Presentation: Spoon   Thin Liquid Thin Liquid: Within functional limits Presentation: Self Fed;Straw Pharyngeal  Phase Impairments: Cough - Delayed Other Comments: Pt with congested cough prior to po however    Nectar Thick Nectar Thick Liquid: Not tested   Honey Thick Honey Thick Liquid: Not tested   Puree Puree:  (Pt declined)   Solid           Thank you,  Peter [name], [place]  Isabellamarie Randa 07/25/2021,11:32  AM

## 2021-07-25 NOTE — ED Notes (Signed)
ED Provider at bedside. 

## 2021-07-25 NOTE — Progress Notes (Signed)
°  Echocardiogram 2D Echocardiogram has been performed.  Peter [name] 07/25/2021, 2:36 PM

## 2021-07-25 NOTE — Progress Notes (Addendum)
RN called due to patient having coffee-colored ground emesis, NG tube was inserted. Rapidoccult test was positive.  Patient was already on Protonix.  Continue management as described in H&P.  Total time:  5 minutes This includes time reviewing the chart including progress notes, labs, EKGs, taking medical decisions, ordering labs and documenting findings.

## 2021-07-25 NOTE — ED Notes (Signed)
Patient noted to have small amount of cough ground emesis upon transfer to ICU. Sherry Ruffing RN and Dr Tat made aware.

## 2021-07-25 NOTE — H&P (Signed)
History and Physical  Peter [name] UMP:[id number] DOB: [birth year] DOA: 07/25/2021   PCP: Redmond School, MD   Patient coming from: Home  Chief Complaint: diarrhea, sob  HPI:  Peter [name] is a 88 y.o. male with medical history of COPD, tobacco abuse, dysphagia, peripheral arterial disease, coronary disease presenting with diarrhea, coughing, shortness of breath.  Patient was recently discharged from the hospital after a stay from 07/20/21 to 07/22/21 when he was treated for his diarrhea and aspiration pneumonia.  The patient was discharged home with Augmentin.  He states that he has taken about 4 doses of Augmentin since discharge from the hospital.  His C. difficile assay was negative at that time.  The patient states that he was feeling well at the time of discharge without any shortness of breath, and his diarrhea had subsided.  However, on 07/24/2021, he began having increasing shortness of breath and worsening diarrhea.  He states that he had up to 10 loose bowel movements without any blood or black tarry stool.  He has had some nausea but without any emesis.  He has had some "spit up" but denies any hematemesis.  However, he states that he has been spitting up some "dark stuff".  He denies any abdominal pain but has had some intermittent cramping.  In the early morning 07/25/2021, the patient was getting up to go to the bathroom when he had a near syncopal episode with increasing shortness of breath.  As result EMS was activated.  ED Patient was febrile to 101.9 F and tachycardic into the 140s.  He was hemodynamically stable.  Oxygen saturation 92% on room air.  BMP showed sodium 137, potassium 3.5, bicarbonate 17, serum creatinine 1.22.  AST 60, ALT 52, alk phosphatase 93, total bilirubin 0.8.  WBC 34.5, hemoglobin 15.4, platelets 422,000.  Lactic acid was 7.9 >>8.1.  Patient was given 2 and half liters of lactated Ringer's and started on levofloxacin initially.  Chest x-ray showed  left-sided infiltrate.  EKG showed atrial flutter with heart rate 150s, nonspecific ST changes.  COVID-19 was negative.  Assessment/Plan: Severe Sepsis -Due to pneumonia -Present on admission -Presented with fever, tachycardia, leukocytosis, elevated lactate -Started Zosyn and vancomycin -Continue IV fluids -Follow blood cultures -UA negative for pyuria -Check PCT  Aspiration pneumonia -Start Zosyn -Evaluated by speech during his last admission>> dysphagia 3 diet with thin liquids  Diarrhea -Check C. Difficile -Stool pathogen panel  Lactic acidosis -Continue fluid resuscitation -Check ABG  COPD exacerbation -Start duo nebs -Start IV Solu-Medrol -Start Pulmicort  Atrial flutter -IV lopressor -Plan to start diltiazem drip if no improvement -Echocardiogram  AKI -baseline creatinine 0.6-0.7 -presented with creatinine 1.22 -due to volume depletion and sepsis  Coronary disease -No chest pain presently -EKG with atrial flutter nonspecific ST changes -Continue aspirin  Tobacco abuse -Tobacco cessation discussed  Essential hypertension -Holding lisinopril and amlodipine due to low blood pressure margin  Heme positive stool -Hgb stable -IV PPI -currently not stable for any endoscopic intervention -GI consult once stable  Goals of Care -discussed with pt and daughter -confirmed DNR   Past Medical History:  Diagnosis Date   Anxiety    Arthritis    BPH (benign prostatic hyperplasia)    Chronic pain    Complication of anesthesia    had an anxiety attack once   COPD (chronic obstructive pulmonary disease) (HCC)    Depression    GERD (gastroesophageal reflux disease)    Hiatal hernia  small   History of nuclear stress test    Nuc 5/19: soft tissue attenuation; no ischemia or scar, EF 72; Low Risk   HTN (hypertension)    Internal carotid artery stenosis, left    Pneumonia    Prostate disease    h/o elevated PSA, neg bx, [facility]   Sciatica     Wears glasses    Wears partial dentures    Past Surgical History:  Procedure Laterality Date   ABDOMINAL AORTOGRAM W/LOWER EXTREMITY N/A 10/26/2017   Procedure: ABDOMINAL AORTOGRAM W/LOWER EXTREMITY;  Surgeon: Waynetta Sandy, MD;  Location: [facility];  Service: Cardiovascular;  Laterality: N/A;  bilateral   BALLOON DILATION  04/23/2018   Procedure: BALLOON DILATION;  Surgeon: Daneil Dolin, MD;  [facility];  Service: Endoscopy;;  esophagus   BIOPSY  01/29/2014   Procedure: BIOPSY;  Surgeon: Daneil Dolin, MD;  [facility];  Service: Endoscopy;;  Gastric and Esophageal   BIOPSY  06/14/2018   Procedure: BIOPSY;  Surgeon: Daneil Dolin, MD;  [facility];  Service: Endoscopy;;  esophagus   CAROTID ENDARTERECTOMY Left 03/20/2017   CATARACT EXTRACTION W/PHACO  05/21/2012   Procedure: CATARACT EXTRACTION PHACO AND INTRAOCULAR LENS PLACEMENT ([place]);  Surgeon: Tonny Branch, MD;  Location: [facility];  Service: Ophthalmology;  Laterality: Right;  CDE 19.34   COLONOSCOPY  12/2004   diverticulosis, hyperplastic polyps, hemorrhoids   ENDARTERECTOMY Left 03/20/2017   Procedure: ENDARTERECTOMY CAROTID-LEFT;  Surgeon: Conrad Newtonia, MD;  Location: [place];  Service: Vascular;  Laterality: Left;   ESOPHAGOGASTRODUODENOSCOPY  2003   schatzki ring, small vascular abnormality of duodenal bulb ?Dieulafoy's lesion (ablated)   ESOPHAGOGASTRODUODENOSCOPY  11/10/10   Dr. Gala Romney dilation 2 rings with 58F   ESOPHAGOGASTRODUODENOSCOPY N/A 01/29/2014   Dr.Rourk- multiple esophageal rings/webs- s/p dialation. small hiatal hernia. abnormal gastric mucosa. bx= mild chronic inactive gastritis (oxyntic mucosa), squamous mucosa with epithelial changes consistent with reflux related injury.   ESOPHAGOGASTRODUODENOSCOPY (EGD) WITH PROPOFOL N/A 04/23/2018   Procedure: ESOPHAGOGASTRODUODENOSCOPY (EGD) WITH PROPOFOL;  Surgeon: Daneil Dolin, MD;  [facility];   Service: Endoscopy;  Laterality: N/A;  2:00pm   ESOPHAGOGASTRODUODENOSCOPY (EGD) WITH PROPOFOL N/A 06/14/2018   Dr. Gala Romney: Obstructing Schatzki ring, status post dilation medium-sized hiatal hernia, biopsies from the mid to distal esophagus obtained which were benign.   MALONEY DILATION N/A 06/14/2018   Procedure: Venia Minks DILATION;  Surgeon: Daneil Dolin, MD;  [facility];  Service: Endoscopy;  Laterality: N/A;   MULTIPLE TOOTH EXTRACTIONS     PATCH ANGIOPLASTY Left 03/20/2017   PATCH ANGIOPLASTY Left 03/20/2017   Procedure: PATCH ANGIOPLASTY;  Surgeon: Conrad Boody, MD;  Location: [facility];  Service: Vascular;  Laterality: Left;   Social History:  reports that he has been smoking cigarettes. He has a 60.00 pack-year smoking history. He has never used smokeless tobacco. He reports that he does not drink alcohol and does not use drugs.   Family History  Problem Relation Age of Onset   Stroke Father 36       deceased   Heart attack Mother    Colon cancer Neg Hx      No Known Allergies   Prior to Admission medications   Medication Sig Start Date End Date Taking? Authorizing Provider  acetaminophen (TYLENOL) 325 MG tablet Take 2 tablets (650 mg total) by mouth every 6 (six) hours as needed for mild pain, fever or headache (or  Fever >/= 101). 07/22/21   Roxan Hockey, MD  albuterol (VENTOLIN HFA) 108 (90 Base) MCG/ACT inhaler Inhale 1-2 puffs into the lungs every 6 (six) hours as needed for wheezing or shortness of breath. 07/22/21   Roxan Hockey, MD  ALPRAZolam Duanne Moron) 0.5 MG tablet Take 0.5 mg by mouth 2 (two) times daily as needed for anxiety.    [provider]  amLODipine (NORVASC) 5 MG tablet Take 5 mg by mouth daily.  11/28/16   [provider]  amoxicillin-clavulanate (AUGMENTIN) 875-125 MG tablet Take 1 tablet by mouth 2 (two) times daily for 5 days. 07/22/21 07/27/21  Roxan Hockey, MD  aspirin EC 81 MG EC tablet Take 1 tablet (81 mg total) by  mouth daily with breakfast. 07/22/21   Roxan Hockey, MD  finasteride (PROSCAR) 5 MG tablet Take 1 tablet (5 mg total) by mouth daily after lunch. 07/22/21   Roxan Hockey, MD  lisinopril (ZESTRIL) 40 MG tablet Take 1 tablet (40 mg total) by mouth daily. 07/22/21   Roxan Hockey, MD  mometasone-formoterol (DULERA) 200-5 MCG/ACT AERO Inhale 2 puffs into the lungs 2 (two) times daily. 07/22/21   Roxan Hockey, MD  ondansetron (ZOFRAN) 4 MG tablet Take 1 tablet (4 mg total) by mouth every 6 (six) hours as needed for nausea. 07/22/21   Roxan Hockey, MD  pantoprazole (PROTONIX) 40 MG tablet Take 1 tablet (40 mg total) by mouth daily. TAKE (1) TABLET BY MOUTH ONCE DAILY BEFORE BREAKFAST. Strength: 40 mg 07/22/21   Roxan Hockey, MD  polyethylene glycol (MIRALAX / GLYCOLAX) 17 g packet Take 17 g by mouth daily as needed for mild constipation. 07/22/21   Roxan Hockey, MD  Potassium Chloride ER 20 MEQ TBCR Take 20 mEq by mouth daily. 1 tab daily by mouth 07/22/21   Roxan Hockey, MD    Review of Systems:  Constitutional:  No weight loss, night sweats, Fevers, chills, fatigue.  Head&Eyes: No headache.  No vision loss.  No eye pain or scotoma ENT:  No Difficulty swallowing,Tooth/dental problems,Sore throat,  No ear ache, post nasal drip,  Cardio-vascular:  No chest pain, Orthopnea, PND, swelling in lower extremities,  dizziness, palpitations  GI:  No oss of appetite, hematochezia, melena, heartburn, indigestion, Resp:   No coughing up of blood .No wheezing.No chest wall deformity  Skin:  no rash or lesions.  GU:  no dysuria, change in color of urine, no urgency or frequency. No flank pain.  Musculoskeletal:  No joint pain or swelling. No decreased range of motion. No back pain.  Psych:  No change in mood or affect. No depression or anxiety. Neurologic: No headache, no dysesthesia, no focal weakness, no vision loss. No syncope  Physical Exam: Vitals:   07/25/21 0500  07/25/21 0530 07/25/21 0600 07/25/21 0630  BP: 117/77 114/61 (!) 130/58 128/66  Pulse: (!) 108 87 72 (!) 113  Resp: (!) 40 (!) 45 (!) 21 (!) 37  Temp: (!) 101.9 F (38.8 C)     TempSrc: Rectal     SpO2: 91% 92% 91% 90%  Weight: 70.3 kg     Height: '5\' 10"'  (1.778 m)      General:  A&O x 3, NAD, nontoxic, pleasant/cooperative Head/Eye: No conjunctival hemorrhage, no icterus, [place]/AT, No nystagmus ENT:  No icterus,  No thrush, good dentition, no pharyngeal exudate Neck:  No masses, no lymphadenpathy, no bruits CV:  IRRR, no rub, no gallop, no S3 Lung:  bilateral rhonchi.  Bilateral wheeze Abdomen: soft/NT, +BS, nondistended, no  peritoneal signs Ext: No cyanosis, No rashes, No petechiae, No lymphangitis, No edema Neuro: CNII-XII intact, strength 4/5 in bilateral upper and lower extremities, no dysmetria  Labs on Admission:  Basic Metabolic Panel: Recent Labs  Lab 07/20/21 1047 07/21/21 0436 07/22/21 0839 07/25/21 0505  NA 130* 132* 132* 137  K 4.2 3.7 3.2* 3.5  CL 98 102 103 105  CO2 '24 23 22 ' 17*  GLUCOSE 122* 111* 106* 196*  BUN '12 11 8 15  ' CREATININE 0.68 0.67 0.62 1.22  CALCIUM 9.1 9.1 8.7* 10.1   Liver Function Tests: Recent Labs  Lab 07/20/21 1047 07/25/21 0505  AST 20 60*  ALT 24 52*  ALKPHOS 97 93  BILITOT 0.3 0.8  PROT 6.8 7.0  ALBUMIN 3.0* 3.1*   Recent Labs  Lab 07/20/21 1047  LIPASE 26   No results for input(s): AMMONIA in the last 168 hours. CBC: Recent Labs  Lab 07/20/21 1047 07/21/21 0436 07/22/21 0839 07/25/21 0505  WBC 22.6* 18.0* 14.6* 34.5*  NEUTROABS 17.5*  --   --  32.1*  HGB 12.6* 11.5* 12.1* 15.4  HCT 36.4* 34.4* 35.5* 46.0  MCV 95.3 94.8 94.2 98.5  PLT 322 296 292 422*   Coagulation Profile: Recent Labs  Lab 07/25/21 0505  INR 1.2   Cardiac Enzymes: No results for input(s): CKTOTAL, CKMB, CKMBINDEX, TROPONINI in the last 168 hours. BNP: Invalid input(s): POCBNP CBG: Recent Labs  Lab 07/20/21 2132 07/21/21 0449   GLUCAP 115* 116*   Urine analysis:    Component Value Date/Time   COLORURINE AMBER (A) 07/25/2021 0531   APPEARANCEUR CLEAR 07/25/2021 0531   LABSPEC 1.025 07/25/2021 0531   PHURINE 6.0 07/25/2021 0531   GLUCOSEU NEGATIVE 07/25/2021 0531   HGBUR TRACE (A) 07/25/2021 0531   BILIRUBINUR NEGATIVE 07/25/2021 0531   KETONESUR NEGATIVE 07/25/2021 0531   PROTEINUR 30 (A) 07/25/2021 0531   NITRITE NEGATIVE 07/25/2021 0531   LEUKOCYTESUR NEGATIVE 07/25/2021 0531   Sepsis Labs: '@LABRCNTIP' (procalcitonin:4,lacticidven:4) ) Recent Results (from the past 240 hour(s))  Resp Panel by RT-PCR (Flu A&B, Covid) Nasopharyngeal Swab     Status: None   Collection Time: 07/20/21 11:10 AM   Specimen: Nasopharyngeal Swab; Nasopharyngeal(NP) swabs in vial transport medium  Result Value Ref Range Status   SARS Coronavirus 2 by RT PCR NEGATIVE NEGATIVE Final    Comment: (NOTE) SARS-CoV-2 target nucleic acids are NOT DETECTED.  The SARS-CoV-2 RNA is generally detectable in upper respiratory specimens during the acute phase of infection. The lowest concentration of SARS-CoV-2 viral copies this assay can detect is 138 copies/mL. A negative result does not preclude SARS-Cov-2 infection and should not be used as the sole basis for treatment or other patient management decisions. A negative result may occur with  improper specimen collection/handling, submission of specimen other than nasopharyngeal swab, presence of viral mutation(s) within the areas targeted by this assay, and inadequate number of viral copies(<138 copies/mL). A negative result must be combined with clinical observations, patient history, and epidemiological information. The expected result is Negative.  Fact Sheet for Patients:  EntrepreneurPulse.com.au  Fact Sheet for Healthcare Providers:  IncredibleEmployment.be  This test is no t yet approved or cleared by the [place] FDA and  has been  authorized for detection and/or diagnosis of SARS-CoV-2 by FDA under an Emergency Use Authorization (EUA). This EUA will remain  in effect (meaning this test can be used) for the duration of the COVID-19 declaration under Section 564(b)(1) of the Act, 21 U.S.C.section 360bbb-3(b)(1), unless the  authorization is terminated  or revoked sooner.       Influenza A by PCR NEGATIVE NEGATIVE Final   Influenza B by PCR NEGATIVE NEGATIVE Final    Comment: (NOTE) The Xpert Xpress SARS-CoV-2/FLU/RSV plus assay is intended as an aid in the diagnosis of influenza from Nasopharyngeal swab specimens and should not be used as a sole basis for treatment. Nasal washings and aspirates are unacceptable for Xpert Xpress SARS-CoV-2/FLU/RSV testing.  Fact Sheet for Patients: EntrepreneurPulse.com.au  Fact Sheet for Healthcare Providers: IncredibleEmployment.be  This test is not yet approved or cleared by the [place] FDA and has been authorized for detection and/or diagnosis of SARS-CoV-2 by FDA under an Emergency Use Authorization (EUA). This EUA will remain in effect (meaning this test can be used) for the duration of the COVID-19 declaration under Section 564(b)(1) of the Act, 21 U.S.C. section 360bbb-3(b)(1), unless the authorization is terminated or revoked.  Performed at [facility], [address], [place], [place] [address]   Gastrointestinal Panel by PCR , Stool     Status: Abnormal   Collection Time: 07/21/21  8:00 AM   Specimen: Stool  Result Value Ref Range Status   Campylobacter species NOT DETECTED NOT DETECTED Final   Plesimonas shigelloides NOT DETECTED NOT DETECTED Final   Salmonella species NOT DETECTED NOT DETECTED Final   Yersinia enterocolitica NOT DETECTED NOT DETECTED Final   Vibrio species NOT DETECTED NOT DETECTED Final   Vibrio cholerae NOT DETECTED NOT DETECTED Final   Enteroaggregative E coli (EAEC) NOT DETECTED NOT DETECTED  Final   Enteropathogenic E coli (EPEC) NOT DETECTED NOT DETECTED Final   Enterotoxigenic E coli (ETEC) NOT DETECTED NOT DETECTED Final   Shiga like toxin producing E coli (STEC) NOT DETECTED NOT DETECTED Final   Shigella/Enteroinvasive E coli (EIEC) NOT DETECTED NOT DETECTED Final   Cryptosporidium NOT DETECTED NOT DETECTED Final   Cyclospora cayetanensis NOT DETECTED NOT DETECTED Final   Entamoeba histolytica NOT DETECTED NOT DETECTED Final   Giardia lamblia NOT DETECTED NOT DETECTED Final   Adenovirus F40/41 NOT DETECTED NOT DETECTED Final   Astrovirus NOT DETECTED NOT DETECTED Final   Norovirus GI/GII DETECTED (A) NOT DETECTED Final    Comment: RESULT CALLED TO, READ BACK BY AND VERIFIED WITHMarline Backbone RN [phone] 07/22/21 HNM    Rotavirus A NOT DETECTED NOT DETECTED Final   Sapovirus (I, II, IV, and V) NOT DETECTED NOT DETECTED Final    Comment: Performed at [facility], [address] [place], [place] [address]  C Difficile Quick Screen w PCR reflex     Status: None   Collection Time: 07/21/21  9:44 AM   Specimen: STOOL  Result Value Ref Range Status   C Diff antigen NEGATIVE NEGATIVE Final   C Diff toxin NEGATIVE NEGATIVE Final   C Diff interpretation No C. difficile detected.  Final    Comment: Performed at [facility], [address] [place], [place] [address]  Resp Panel by RT-PCR (Flu A&B, Covid) Nasopharyngeal Swab     Status: None   Collection Time: 07/25/21  5:05 AM   Specimen: Nasopharyngeal Swab; Nasopharyngeal(NP) swabs in vial transport medium  Result Value Ref Range Status   SARS Coronavirus 2 by RT PCR NEGATIVE NEGATIVE Final    Comment: (NOTE) SARS-CoV-2 target nucleic acids are NOT DETECTED.  The SARS-CoV-2 RNA is generally detectable in upper respiratory specimens during the acute phase of infection. The lowest concentration of SARS-CoV-2 viral copies this assay can detect is  138 copies/mL. A negative result does not preclude SARS-Cov-2 infection  and should not be used as the sole basis for treatment or other patient management decisions. A negative result may occur with  improper specimen collection/handling, submission of specimen other than nasopharyngeal swab, presence of viral mutation(s) within the areas targeted by this assay, and inadequate number of viral copies(<138 copies/mL). A negative result must be combined with clinical observations, patient history, and epidemiological information. The expected result is Negative.  Fact Sheet for Patients:  EntrepreneurPulse.com.au  Fact Sheet for Healthcare Providers:  IncredibleEmployment.be  This test is no t yet approved or cleared by the [place] FDA and  has been authorized for detection and/or diagnosis of SARS-CoV-2 by FDA under an Emergency Use Authorization (EUA). This EUA will remain  in effect (meaning this test can be used) for the duration of the COVID-19 declaration under Section 564(b)(1) of the Act, 21 U.S.C.section 360bbb-3(b)(1), unless the authorization is terminated  or revoked sooner.       Influenza A by PCR NEGATIVE NEGATIVE Final   Influenza B by PCR NEGATIVE NEGATIVE Final    Comment: (NOTE) The Xpert Xpress SARS-CoV-2/FLU/RSV plus assay is intended as an aid in the diagnosis of influenza from Nasopharyngeal swab specimens and should not be used as a sole basis for treatment. Nasal washings and aspirates are unacceptable for Xpert Xpress SARS-CoV-2/FLU/RSV testing.  Fact Sheet for Patients: EntrepreneurPulse.com.au  Fact Sheet for Healthcare Providers: IncredibleEmployment.be  This test is not yet approved or cleared by the [place] FDA and has been authorized for detection and/or diagnosis of SARS-CoV-2 by FDA under an Emergency Use Authorization (EUA). This EUA will remain in effect (meaning this test can be used) for the duration of the COVID-19 declaration  under Section 564(b)(1) of the Act, 21 U.S.C. section 360bbb-3(b)(1), unless the authorization is terminated or revoked.  Performed at [facility], [address] [place], [place] [address]      Radiological Exams on Admission: DG Chest [facility] 1 View  Result Date: 07/25/2021 CLINICAL DATA:  Questionable sepsis EXAM: PORTABLE CHEST 1 VIEW COMPARISON:  Five days ago FINDINGS: New airspace disease on the left. Clear right lung. Normal heart size and mediastinal contours. Extensive artifact from EKG leads. IMPRESSION: New, extensive pneumonia on the left. Electronically Signed   By: Jorje Guild M.D.   On: 07/25/2021 05:27    EKG: Independently reviewed. Aflutter.  Nonspecific ST changes    Time spent:60 minutes Code Status:   DNR--confirmed with patient and daughter 1/15 Family Communication:  No Family at bedside Disposition Plan: expect 2 day hospitalization Consults called: none DVT Prophylaxis: [place] Lovenox  Peter Eva, Peter [name]  [facility] Pager [phone]  If 7PM-7AM, please contact night-coverage [email or web address] Password [facility] 07/25/2021, 7:39 AM

## 2021-07-25 NOTE — Progress Notes (Signed)
Elink following code sepsis °

## 2021-07-25 NOTE — Progress Notes (Signed)
Pharmacy Antibiotic Note  Peter [name] is a 88 y.o. male admitted on 07/25/2021 with sepsis.  Pharmacy has been consulted for vancomycin dosing.  Plan: Vancomycin 1500 mg IV x 1 dose. Vancomycin 1000 mg IV every 24 hours. Zosyn 3.375g IV every 8 hours. Monitor labs, c/s, and vanco level as indicated.  Height: 5\' 10"  (177.8 cm) Weight: 72.7 kg (160 lb 4.4 oz) IBW/kg (Calculated) : 73  Temp (24hrs), Avg:99.9 F (37.7 C), Min:97.9 F (36.6 C), Max:101.9 F (38.8 C)  Recent Labs  Lab 07/20/21 1047 07/20/21 1048 07/21/21 0436 07/22/21 0839 07/25/21 0505 07/25/21 0652  WBC 22.6*  --  18.0* 14.6* 34.5*  --   CREATININE 0.68  --  0.67 0.62 1.22  --   LATICACIDVEN  --  1.4  --   --  7.9* 8.1*    Estimated Creatinine Clearance: 44.7 mL/min (by C-G formula based on SCr of 1.22 mg/dL).    No Known Allergies  Antimicrobials this admission: Vanco 1/15 >> Zosyn 1/15  >> Levaquin 1/15  Microbiology results: 1/15 BCx: pending 1/15 MRSA PCR: pending C Diff:  pending  Thank you for allowing pharmacy to be a part of this patients care.  Margot Ables, PharmD Clinical Pharmacist 07/25/2021 9:37 AM

## 2021-07-25 NOTE — ED Notes (Signed)
Date and time results received: 07/25/21 7:28 AM   Test: Lactic Acid Critical Value: 8.1  Name of Provider Notified: Dr Tat  Orders Received? Or Actions Taken?: See orders

## 2021-07-25 NOTE — Sepsis Progress Note (Signed)
Notified provider and bedside nurse of need to order repeat lactic acid.  

## 2021-07-26 ENCOUNTER — Inpatient Hospital Stay (HOSPITAL_COMMUNITY): Payer: Medicare Other

## 2021-07-26 ENCOUNTER — Encounter (HOSPITAL_COMMUNITY): Payer: Self-pay | Admitting: Radiology

## 2021-07-26 DIAGNOSIS — I708 Atherosclerosis of other arteries: Secondary | ICD-10-CM

## 2021-07-26 DIAGNOSIS — R1033 Periumbilical pain: Secondary | ICD-10-CM

## 2021-07-26 DIAGNOSIS — Z515 Encounter for palliative care: Secondary | ICD-10-CM

## 2021-07-26 DIAGNOSIS — A419 Sepsis, unspecified organism: Principal | ICD-10-CM

## 2021-07-26 DIAGNOSIS — K55069 Acute infarction of intestine, part and extent unspecified: Secondary | ICD-10-CM

## 2021-07-26 DIAGNOSIS — K559 Vascular disorder of intestine, unspecified: Secondary | ICD-10-CM

## 2021-07-26 DIAGNOSIS — Z7189 Other specified counseling: Secondary | ICD-10-CM

## 2021-07-26 DIAGNOSIS — R7401 Elevation of levels of liver transaminase levels: Secondary | ICD-10-CM

## 2021-07-26 LAB — CBC WITH DIFFERENTIAL/PLATELET
Abs Immature Granulocytes: 2.12 10*3/uL — ABNORMAL HIGH (ref 0.00–0.07)
Basophils Absolute: 0.1 10*3/uL (ref 0.0–0.1)
Basophils Relative: 0 %
Eosinophils Absolute: 0 10*3/uL (ref 0.0–0.5)
Eosinophils Relative: 0 %
HCT: 37.3 % — ABNORMAL LOW (ref 39.0–52.0)
Hemoglobin: 12.3 g/dL — ABNORMAL LOW (ref 13.0–17.0)
Immature Granulocytes: 3 %
Lymphocytes Relative: 1 %
Lymphs Abs: 0.8 10*3/uL (ref 0.7–4.0)
MCH: 32.4 pg (ref 26.0–34.0)
MCHC: 33 g/dL (ref 30.0–36.0)
MCV: 98.2 fL (ref 80.0–100.0)
Monocytes Absolute: 1.8 10*3/uL — ABNORMAL HIGH (ref 0.1–1.0)
Monocytes Relative: 3 %
Neutro Abs: 69.7 10*3/uL — ABNORMAL HIGH (ref 1.7–7.7)
Neutrophils Relative %: 93 %
Platelets: 350 10*3/uL (ref 150–400)
RBC: 3.8 MIL/uL — ABNORMAL LOW (ref 4.22–5.81)
RDW: 12.9 % (ref 11.5–15.5)
WBC: 74.5 10*3/uL (ref 4.0–10.5)
nRBC: 0 % (ref 0.0–0.2)

## 2021-07-26 LAB — HEPATITIS PANEL, ACUTE
HCV Ab: NONREACTIVE
Hep A IgM: NONREACTIVE
Hep B C IgM: NONREACTIVE
Hepatitis B Surface Ag: NONREACTIVE

## 2021-07-26 LAB — CBC
HCT: 36.6 % — ABNORMAL LOW (ref 39.0–52.0)
Hemoglobin: 12.2 g/dL — ABNORMAL LOW (ref 13.0–17.0)
MCH: 32.3 pg (ref 26.0–34.0)
MCHC: 33.3 g/dL (ref 30.0–36.0)
MCV: 96.8 fL (ref 80.0–100.0)
Platelets: 349 10*3/uL (ref 150–400)
RBC: 3.78 MIL/uL — ABNORMAL LOW (ref 4.22–5.81)
RDW: 12.8 % (ref 11.5–15.5)
WBC: 75.2 10*3/uL (ref 4.0–10.5)
nRBC: 0 % (ref 0.0–0.2)

## 2021-07-26 LAB — OCCULT BLOOD GASTRIC / DUODENUM (SPECIMEN CUP)
Occult Blood, Gastric: POSITIVE — AB
pH, Gastric: 6

## 2021-07-26 LAB — COMPREHENSIVE METABOLIC PANEL
ALT: 2041 U/L — ABNORMAL HIGH (ref 0–44)
AST: 2195 U/L — ABNORMAL HIGH (ref 15–41)
Albumin: 2.5 g/dL — ABNORMAL LOW (ref 3.5–5.0)
Alkaline Phosphatase: 130 U/L — ABNORMAL HIGH (ref 38–126)
Anion gap: 9 (ref 5–15)
BUN: 20 mg/dL (ref 8–23)
CO2: 19 mmol/L — ABNORMAL LOW (ref 22–32)
Calcium: 9.4 mg/dL (ref 8.9–10.3)
Chloride: 112 mmol/L — ABNORMAL HIGH (ref 98–111)
Creatinine, Ser: 0.79 mg/dL (ref 0.61–1.24)
GFR, Estimated: >60 mL/min (ref >60)
Glucose, Bld: 105 mg/dL — ABNORMAL HIGH (ref 70–99)
Potassium: 3.8 mmol/L (ref 3.5–5.1)
Sodium: 140 mmol/L (ref 135–145)
Total Bilirubin: 1.2 mg/dL (ref 0.3–1.2)
Total Protein: 5.5 g/dL — ABNORMAL LOW (ref 6.5–8.1)

## 2021-07-26 LAB — RETICULOCYTES
Immature Retic Fract: 36.9 % — ABNORMAL HIGH (ref 2.3–15.9)
RBC.: 3.75 MIL/uL — ABNORMAL LOW (ref 4.22–5.81)
Retic Count, Absolute: 93.4 10*3/uL (ref 19.0–186.0)
Retic Ct Pct: 2.5 % (ref 0.4–3.1)

## 2021-07-26 LAB — BASIC METABOLIC PANEL
Anion gap: 10 (ref 5–15)
BUN: 31 mg/dL — ABNORMAL HIGH (ref 8–23)
CO2: 23 mmol/L (ref 22–32)
Calcium: 8.9 mg/dL (ref 8.9–10.3)
Chloride: 109 mmol/L (ref 98–111)
Creatinine, Ser: 0.98 mg/dL (ref 0.61–1.24)
GFR, Estimated: >60 mL/min (ref >60)
Glucose, Bld: 117 mg/dL — ABNORMAL HIGH (ref 70–99)
Potassium: 3.3 mmol/L — ABNORMAL LOW (ref 3.5–5.1)
Sodium: 142 mmol/L (ref 135–145)

## 2021-07-26 LAB — PROCALCITONIN: Procalcitonin: 12.04 ng/mL

## 2021-07-26 LAB — LACTIC ACID, PLASMA: Lactic Acid, Venous: 2.6 mmol/L (ref 0.5–1.9)

## 2021-07-26 LAB — MAGNESIUM: Magnesium: 1.9 mg/dL (ref 1.7–2.4)

## 2021-07-26 LAB — HEPARIN LEVEL (UNFRACTIONATED): Heparin Unfractionated: 0.16 IU/mL — ABNORMAL LOW (ref 0.30–0.70)

## 2021-07-26 LAB — T4, FREE: Free T4: 0.82 ng/dL (ref 0.61–1.12)

## 2021-07-26 LAB — LACTATE DEHYDROGENASE: LDH: 2193 U/L — ABNORMAL HIGH (ref 98–192)

## 2021-07-26 LAB — TSH: TSH: 0.701 u[IU]/mL (ref 0.350–4.500)

## 2021-07-26 MED ORDER — HEPARIN BOLUS VIA INFUSION
4000.0000 [IU] | Freq: Once | INTRAVENOUS | Status: AC
Start: 2021-07-26 — End: 2021-07-26
  Administered 2021-07-26: 4000 [IU] via INTRAVENOUS
  Filled 2021-07-26: qty 4000

## 2021-07-26 MED ORDER — IOHEXOL 300 MG/ML  SOLN
100.0000 mL | Freq: Once | INTRAMUSCULAR | Status: AC | PRN
  Administered 2021-07-26: 100 mL via INTRAVENOUS

## 2021-07-26 MED ORDER — HEPARIN (PORCINE) 25000 UT/250ML-% IV SOLN
1750.0000 [IU]/h | INTRAVENOUS | Status: DC
  Administered 2021-07-26: 1150 [IU]/h via INTRAVENOUS
  Administered 2021-07-27: 1450 [IU]/h via INTRAVENOUS
  Filled 2021-07-26 (×2): qty 250

## 2021-07-26 MED ORDER — PANTOPRAZOLE INFUSION (NEW) - SIMPLE MED
8.0000 mg/h | INTRAVENOUS | Status: DC
  Administered 2021-07-26 – 2021-07-27 (×3): 8 mg/h via INTRAVENOUS
  Filled 2021-07-26 (×2): qty 100
  Filled 2021-07-26: qty 80
  Filled 2021-07-26: qty 100
  Filled 2021-07-26: qty 80
  Filled 2021-07-26: qty 100

## 2021-07-26 MED ORDER — HEPARIN BOLUS VIA INFUSION
2000.0000 [IU] | Freq: Once | INTRAVENOUS | Status: DC
  Filled 2021-07-26: qty 2000

## 2021-07-26 MED ORDER — POTASSIUM CHLORIDE 10 MEQ/100ML IV SOLN
10.0000 meq | INTRAVENOUS | Status: AC
  Administered 2021-07-26 – 2021-07-27 (×3): 10 meq via INTRAVENOUS
  Filled 2021-07-26 (×3): qty 100

## 2021-07-26 MED ORDER — CHLORHEXIDINE GLUCONATE 0.12 % MT SOLN
15.0000 mL | Freq: Two times a day (BID) | OROMUCOSAL | Status: DC
  Administered 2021-07-26 – 2021-07-27 (×3): 15 mL via OROMUCOSAL
  Filled 2021-07-26 (×2): qty 15

## 2021-07-26 MED ORDER — PANTOPRAZOLE 80MG IVPB - SIMPLE MED
80.0000 mg | Freq: Once | INTRAVENOUS | Status: AC
  Administered 2021-07-26: 80 mg via INTRAVENOUS
  Filled 2021-07-26: qty 80

## 2021-07-26 MED ORDER — FUROSEMIDE 10 MG/ML IJ SOLN
40.0000 mg | Freq: Once | INTRAMUSCULAR | Status: AC
  Administered 2021-07-26: 40 mg via INTRAVENOUS
  Filled 2021-07-26: qty 4

## 2021-07-26 MED ORDER — ORAL CARE MOUTH RINSE
15.0000 mL | Freq: Two times a day (BID) | OROMUCOSAL | Status: DC
  Administered 2021-07-26 – 2021-07-27 (×4): 15 mL via OROMUCOSAL

## 2021-07-26 MED ORDER — LACTATED RINGERS IV SOLN: INTRAVENOUS | Status: DC

## 2021-07-26 MED ORDER — MAGNESIUM SULFATE 2 GM/50ML IV SOLN
2.0000 g | Freq: Once | INTRAVENOUS | Status: AC
  Administered 2021-07-26: 2 g via INTRAVENOUS
  Filled 2021-07-26: qty 50

## 2021-07-26 MED ORDER — PANTOPRAZOLE SODIUM 40 MG IV SOLR: 40.0000 mg | Freq: Two times a day (BID) | INTRAVENOUS | Status: DC

## 2021-07-26 NOTE — Progress Notes (Signed)
RN called due to patient  retaining >750cc of urine.  This is the 3rd episode of retention of large amount of urine requiring in and out catheterization within last 24 hours per RN .  A temporal Foley catheter due to acute urinary retention will be placed with consideration for possible outpatient urology consult if patient continues to have urinary retention prior to discharge.

## 2021-07-26 NOTE — Progress Notes (Addendum)
ANTICOAGULATION CONSULT NOTE - Follow Up Consult  Pharmacy Consult for heparin Indication:  VTE treatment  No Known Allergies  Patient Measurements: Height: 5\' 10"  (177.8 cm) Weight: 72.7 kg (160 lb 4.4 oz) IBW/kg (Calculated) : 73 Heparin Dosing Weight: 72 kg  Vital Signs: Temp: 98 F (36.7 C) (01/16 1100) Temp Source: Oral (01/16 1100) BP: 180/61 (01/16 1130) Pulse Rate: 101 (01/16 1130)  Labs: Recent Labs    07/25/21 0505 07/26/21 0510  HGB 15.4 12.3*   12.2*  HCT 46.0 37.3*   36.6*  PLT 422* 350   349  APTT 27  --   LABPROT 15.5*  --   INR 1.2  --   CREATININE 1.22 0.79    Estimated Creatinine Clearance: 68.2 mL/min (by C-G formula based on SCr of 0.79 mg/dL).   Medications:  Medications Prior to Admission  Medication Sig Dispense Refill Last Dose   acetaminophen (TYLENOL) 325 MG tablet Take 2 tablets (650 mg total) by mouth every 6 (six) hours as needed for mild pain, fever or headache (or Fever >/= 101). 12 tablet 0 unknown   albuterol (VENTOLIN HFA) 108 (90 Base) MCG/ACT inhaler Inhale 1-2 puffs into the lungs every 6 (six) hours as needed for wheezing or shortness of breath. 18 g 1 unknown   ALPRAZolam (XANAX) 0.5 MG tablet Take 0.5 mg by mouth 2 (two) times daily as needed for anxiety.   07/24/2021   amLODipine (NORVASC) 5 MG tablet Take 5 mg by mouth daily.    07/24/2021   amoxicillin-clavulanate (AUGMENTIN) 875-125 MG tablet Take 1 tablet by mouth 2 (two) times daily for 5 days. 10 tablet 0 07/24/2021   aspirin EC 81 MG EC tablet Take 1 tablet (81 mg total) by mouth daily with breakfast. 30 tablet 12 unknown   finasteride (PROSCAR) 5 MG tablet Take 1 tablet (5 mg total) by mouth daily after lunch. 90 tablet 2 07/24/2021   lisinopril (ZESTRIL) 40 MG tablet Take 1 tablet (40 mg total) by mouth daily. 30 tablet 3 07/24/2021   mometasone-formoterol (DULERA) 200-5 MCG/ACT AERO Inhale 2 puffs into the lungs 2 (two) times daily. 13 g 2 07/24/2021   pantoprazole  (PROTONIX) 40 MG tablet Take 1 tablet (40 mg total) by mouth daily. TAKE (1) TABLET BY MOUTH ONCE DAILY BEFORE BREAKFAST. Strength: 40 mg 90 tablet 2 07/24/2021   Potassium Chloride ER 20 MEQ TBCR Take 20 mEq by mouth daily. 1 tab daily by mouth 10 tablet 0 07/24/2021   ondansetron (ZOFRAN) 4 MG tablet Take 1 tablet (4 mg total) by mouth every 6 (six) hours as needed for nausea. (Patient not taking: Reported on 07/25/2021) 20 tablet 0 Not Taking   polyethylene glycol (MIRALAX / GLYCOLAX) 17 g packet Take 17 g by mouth daily as needed for mild constipation. 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  Patient is not on anticoagulation prior to admission.  CBC WNL  Goal of Therapy:  Heparin level 0.3-0.7 units/ml Monitor platelets by anticoagulation protocol: Yes   Plan:  Give 4000 units bolus x 1 Start heparin infusion at 1150 units/hr Check anti-Xa level in 8 hours and daily while on heparin Continue to monitor H&H and platelets  Margot Ables, PharmD Clinical Pharmacist 07/26/2021 12:10 PM

## 2021-07-26 NOTE — Consult Note (Signed)
[place]  Reason for Consult: Mesenteric artery thrombosis Referring Physician:  Dr. Carles Collet   Chief Complaint   Code Sepsis     HPI: Peter [name] is a 88 y.o. male with COPD, CAD, PAD s/p bypasses in the extremities, who has been admitted recently with PNA and discharged on antibiotics. He was readmittd with diarrhea, coughing and shortness of breath.   He had worsening abdominal pain this AM, and Dr. Carles Collet obtained a CT that demonstrates a high grade stenosis versus complete opacification of the SMA and celiac. He has had a lactic acidosis and his leukocytosis jumped to 75. There is no overt signs of bowel ischemia currently. He remains lucid and hemodynamically stable.   He is in the room with his family.   Past Medical History:  Diagnosis Date   Anxiety    Arthritis    BPH (benign prostatic hyperplasia)    Chronic pain    Complication of anesthesia    had an anxiety attack once   COPD (chronic obstructive pulmonary disease) (HCC)    Depression    GERD (gastroesophageal reflux disease)    Hiatal hernia    small   History of nuclear stress test    Nuc 5/19: soft tissue attenuation; no ischemia or scar, EF 72; Low Risk   HTN (hypertension)    Internal carotid artery stenosis, left    Pneumonia    Prostate disease    h/o elevated PSA, neg bx, [facility]   Sciatica    Wears glasses    Wears partial dentures     Past Surgical History:  Procedure Laterality Date   ABDOMINAL AORTOGRAM W/LOWER EXTREMITY N/A 10/26/2017   Procedure: ABDOMINAL AORTOGRAM W/LOWER EXTREMITY;  Surgeon: Waynetta Sandy, MD;  Location: [facility];  Service: Cardiovascular;  Laterality: N/A;  bilateral   BALLOON DILATION  04/23/2018   Procedure: BALLOON DILATION;  Surgeon: Daneil Dolin, MD;  [facility];  Service: Endoscopy;;  esophagus   BIOPSY  01/29/2014   Procedure: BIOPSY;  Surgeon: Daneil Dolin, MD;  [facility];   Service: Endoscopy;;  Gastric and Esophageal   BIOPSY  06/14/2018   Procedure: BIOPSY;  Surgeon: Daneil Dolin, MD;  [facility];  Service: Endoscopy;;  esophagus   CAROTID ENDARTERECTOMY Left 03/20/2017   CATARACT EXTRACTION W/PHACO  05/21/2012   Procedure: CATARACT EXTRACTION PHACO AND INTRAOCULAR LENS PLACEMENT ([place]);  Surgeon: Tonny Branch, MD;  Location: [facility];  Service: Ophthalmology;  Laterality: Right;  CDE 19.34   COLONOSCOPY  12/2004   diverticulosis, hyperplastic polyps, hemorrhoids   ENDARTERECTOMY Left 03/20/2017   Procedure: ENDARTERECTOMY CAROTID-LEFT;  Surgeon: Conrad Plaucheville, MD;  Location: [place];  Service: Vascular;  Laterality: Left;   ESOPHAGOGASTRODUODENOSCOPY  2003   schatzki ring, small vascular abnormality of duodenal bulb ?Dieulafoy's lesion (ablated)   ESOPHAGOGASTRODUODENOSCOPY  11/10/10   Dr. Gala Romney dilation 2 rings with 36F   ESOPHAGOGASTRODUODENOSCOPY N/A 01/29/2014   Dr.Rourk- multiple esophageal rings/webs- s/p dialation. small hiatal hernia. abnormal gastric mucosa. bx= mild chronic inactive gastritis (oxyntic mucosa), squamous mucosa with epithelial changes consistent with reflux related injury.   ESOPHAGOGASTRODUODENOSCOPY (EGD) WITH PROPOFOL N/A 04/23/2018   Procedure: ESOPHAGOGASTRODUODENOSCOPY (EGD) WITH PROPOFOL;  Surgeon: Daneil Dolin, MD;  [facility];  Service: Endoscopy;  Laterality: N/A;  2:00pm   ESOPHAGOGASTRODUODENOSCOPY (EGD) WITH PROPOFOL N/A 06/14/2018   Dr. Gala Romney: Obstructing Schatzki ring, status post dilation medium-sized hiatal hernia, biopsies from  the mid to distal esophagus obtained which were benign.   MALONEY DILATION N/A 06/14/2018   Procedure: Venia Minks DILATION;  Surgeon: Daneil Dolin, MD;  [facility];  Service: Endoscopy;  Laterality: N/A;   MULTIPLE TOOTH EXTRACTIONS     PATCH ANGIOPLASTY Left 03/20/2017   PATCH ANGIOPLASTY Left 03/20/2017   Procedure: PATCH ANGIOPLASTY;  Surgeon: Conrad Wray,  MD;  Location: [facility];  Service: Vascular;  Laterality: Left;    Family History  Problem Relation Age of Onset   Stroke Father 89       deceased   Heart attack Mother    Colon cancer Neg Hx     Social History   Tobacco Use   Smoking status: Every Day    Packs/day: 1.00    Years: 60.00    Pack years: 60.00    Types: Cigarettes   Smokeless tobacco: Never  Vaping Use   Vaping Use: Never used  Substance Use Topics   Alcohol use: No    Comment: quit 10  years ago   Drug use: No    Medications: I have reviewed the patient's current medications. Prior to Admission:  Medications Prior to Admission  Medication Sig Dispense Refill Last Dose   acetaminophen (TYLENOL) 325 MG tablet Take 2 tablets (650 mg total) by mouth every 6 (six) hours as needed for mild pain, fever or headache (or Fever >/= 101). 12 tablet 0 unknown   albuterol (VENTOLIN HFA) 108 (90 Base) MCG/ACT inhaler Inhale 1-2 puffs into the lungs every 6 (six) hours as needed for wheezing or shortness of breath. 18 g 1 unknown   ALPRAZolam (XANAX) 0.5 MG tablet Take 0.5 mg by mouth 2 (two) times daily as needed for anxiety.   07/24/2021   amLODipine (NORVASC) 5 MG tablet Take 5 mg by mouth daily.    07/24/2021   amoxicillin-clavulanate (AUGMENTIN) 875-125 MG tablet Take 1 tablet by mouth 2 (two) times daily for 5 days. 10 tablet 0 07/24/2021   aspirin EC 81 MG EC tablet Take 1 tablet (81 mg total) by mouth daily with breakfast. 30 tablet 12 unknown   finasteride (PROSCAR) 5 MG tablet Take 1 tablet (5 mg total) by mouth daily after lunch. 90 tablet 2 07/24/2021   lisinopril (ZESTRIL) 40 MG tablet Take 1 tablet (40 mg total) by mouth daily. 30 tablet 3 07/24/2021   mometasone-formoterol (DULERA) 200-5 MCG/ACT AERO Inhale 2 puffs into the lungs 2 (two) times daily. 13 g 2 07/24/2021   pantoprazole (PROTONIX) 40 MG tablet Take 1 tablet (40 mg total) by mouth daily. TAKE (1) TABLET BY MOUTH ONCE DAILY BEFORE BREAKFAST. Strength: 40 mg 90  tablet 2 07/24/2021   Potassium Chloride ER 20 MEQ TBCR Take 20 mEq by mouth daily. 1 tab daily by mouth 10 tablet 0 07/24/2021   ondansetron (ZOFRAN) 4 MG tablet Take 1 tablet (4 mg total) by mouth every 6 (six) hours as needed for nausea. (Patient not taking: Reported on 07/25/2021) 20 tablet 0 Not Taking   polyethylene glycol (MIRALAX / GLYCOLAX) 17 g packet Take 17 g by mouth daily as needed for mild constipation. (Patient not taking: Reported on 07/25/2021) 30 each 0 Not Taking   Scheduled:  budesonide (PULMICORT) nebulizer solution  0.5 mg Nebulization BID   chlorhexidine  15 mL Mouth Rinse BID   Chlorhexidine Gluconate Cloth  6 each Topical Q0600   finasteride  5 mg Oral QPC lunch   ipratropium-albuterol  3 mL Nebulization Q6H  mouth rinse  15 mL Mouth Rinse q12n4p   methylPREDNISolone (SOLU-MEDROL) injection  60 mg Intravenous Q12H   nicotine  21 mg Transdermal Daily   [START ON 07/29/2021] pantoprazole  40 mg Intravenous Q12H   Continuous:  heparin 1,150 Units/hr (07/26/21 1246)   pantoprazole Stopped (07/26/21 0941)   piperacillin-tazobactam (ZOSYN)  IV 3.375 g (07/26/21 1235)   ZOX:[id number] **OR** acetaminophen, ALPRAZolam, labetalol, ondansetron **OR** ondansetron (ZOFRAN) IV  No Known Allergies   ROS:  A comprehensive review of systems was negative except for: Respiratory: positive for SOB Gastrointestinal: positive for abdominal pain, diarrhea, nausea, and spitting up dark material  Blood pressure (!) 176/56, pulse 94, temperature 98 F (36.7 C), temperature source Oral, resp. rate (!) 41, height 5\' 10"  (1.778 m), weight 72.7 kg, SpO2 97 %. Physical Exam Vitals reviewed.  HENT:     Head: Normocephalic.     Nose: Nose normal.  Eyes:     Extraocular Movements: Extraocular movements intact.  Cardiovascular:     Rate and Rhythm: Normal rate.  Pulmonary:     Effort: Pulmonary effort is normal.  Abdominal:     General: There is distension.     Palpations: Abdomen  is soft.     Tenderness: There is generalized abdominal tenderness.  Musculoskeletal:        General: No swelling.  Skin:    General: Skin is warm.  Neurological:     General: No focal deficit present.     Mental Status: He is alert and oriented to person, place, and time.  Psychiatric:        Mood and Affect: Mood normal.        Behavior: Behavior normal.    Results: Results for orders placed or performed during the hospital encounter of 07/25/21 (from the past 48 hour(s))  Resp Panel by RT-PCR (Flu A&B, Covid) Nasopharyngeal Swab     Status: None   Collection Time: 07/25/21  5:05 AM   Specimen: Nasopharyngeal Swab; Nasopharyngeal(NP) swabs in vial transport medium  Result Value Ref Range   SARS Coronavirus 2 by RT PCR NEGATIVE NEGATIVE    Comment: (NOTE) SARS-CoV-2 target nucleic acids are NOT DETECTED.  The SARS-CoV-2 RNA is generally detectable in upper respiratory specimens during the acute phase of infection. The lowest concentration of SARS-CoV-2 viral copies this assay can detect is 138 copies/mL. A negative result does not preclude SARS-Cov-2 infection and should not be used as the sole basis for treatment or other patient management decisions. A negative result may occur with  improper specimen collection/handling, submission of specimen other than nasopharyngeal swab, presence of viral mutation(s) within the areas targeted by this assay, and inadequate number of viral copies(<138 copies/mL). A negative result must be combined with clinical observations, patient history, and epidemiological information. The expected result is Negative.  Fact Sheet for Patients:  EntrepreneurPulse.com.au  Fact Sheet for Healthcare Providers:  IncredibleEmployment.be  This test is no t yet approved or cleared by the [place] FDA and  has been authorized for detection and/or diagnosis of SARS-CoV-2 by FDA under an Emergency Use Authorization  (EUA). This EUA will remain  in effect (meaning this test can be used) for the duration of the COVID-19 declaration under Section 564(b)(1) of the Act, 21 U.S.C.section 360bbb-3(b)(1), unless the authorization is terminated  or revoked sooner.       Influenza A by PCR NEGATIVE NEGATIVE   Influenza B by PCR NEGATIVE NEGATIVE    Comment: (NOTE) The Xpert Xpress SARS-CoV-2/FLU/RSV plus  assay is intended as an aid in the diagnosis of influenza from Nasopharyngeal swab specimens and should not be used as a sole basis for treatment. Nasal washings and aspirates are unacceptable for Xpert Xpress SARS-CoV-2/FLU/RSV testing.  Fact Sheet for Patients: EntrepreneurPulse.com.au  Fact Sheet for Healthcare Providers: IncredibleEmployment.be  This test is not yet approved or cleared by the [place] FDA and has been authorized for detection and/or diagnosis of SARS-CoV-2 by FDA under an Emergency Use Authorization (EUA). This EUA will remain in effect (meaning this test can be used) for the duration of the COVID-19 declaration under Section 564(b)(1) of the Act, 21 U.S.C. section 360bbb-3(b)(1), unless the authorization is terminated or revoked.  Performed at [facility], [address], [place], [place] [address]   Lactic acid, plasma     Status: Abnormal   Collection Time: 07/25/21  5:05 AM  Result Value Ref Range   Lactic Acid, Venous 7.9 (HH) 0.5 - 1.9 mmol/L    Comment: CRITICAL RESULT CALLED TO, READ BACK BY AND VERIFIED WITH: B. TEASLEY @ 8315 07/25/21 BY STEPHTR Performed at [facility], [address] [place], [place] [address]   Comprehensive metabolic panel     Status: Abnormal   Collection Time: 07/25/21  5:05 AM  Result Value Ref Range   Sodium 137 135 - 145 mmol/L   Potassium 3.5 3.5 - 5.1 mmol/L   Chloride 105 98 - 111 mmol/L   CO2 17 (L) 22 - 32 mmol/L   Glucose, Bld 196 (H) 70 - 99 mg/dL    Comment: Glucose reference range  applies only to samples taken after fasting for at least 8 hours.   BUN 15 8 - 23 mg/dL   Creatinine, Ser 1.22 0.61 - 1.24 mg/dL   Calcium 10.1 8.9 - 10.3 mg/dL   Total Protein 7.0 6.5 - 8.1 g/dL   Albumin 3.1 (L) 3.5 - 5.0 g/dL   AST 60 (H) 15 - 41 U/L   ALT 52 (H) 0 - 44 U/L    Comment: RESULTS CONFIRMED BY MANUAL DILUTION   Alkaline Phosphatase 93 38 - 126 U/L   Total Bilirubin 0.8 0.3 - 1.2 mg/dL   GFR, Estimated 58 (L) >60 mL/min    Comment: (NOTE) Calculated using the CKD-EPI Creatinine Equation (2021)    Anion gap 15 5 - 15    Comment: Performed at [facility], [address], [place], [place] [address]  CBC WITH DIFFERENTIAL     Status: Abnormal   Collection Time: 07/25/21  5:05 AM  Result Value Ref Range   WBC 34.5 (H) 4.0 - 10.5 K/uL   RBC 4.67 4.22 - 5.81 MIL/uL   Hemoglobin 15.4 13.0 - 17.0 g/dL   HCT 46.0 39.0 - 52.0 %   MCV 98.5 80.0 - 100.0 fL   MCH 33.0 26.0 - 34.0 pg   MCHC 33.5 30.0 - 36.0 g/dL   RDW 12.5 11.5 - 15.5 %   Platelets 422 (H) 150 - 400 K/uL   nRBC 0.0 0.0 - 0.2 %   Neutrophils Relative % 88 %   Neutro Abs 32.1 (H) 1.7 - 7.7 K/uL   Band Neutrophils 5 %   Lymphocytes Relative 4 %   Lymphs Abs 1.4 0.7 - 4.0 K/uL   Monocytes Relative 3 %   Monocytes Absolute 1.0 0.1 - 1.0 K/uL   Eosinophils Relative 0 %   Eosinophils Absolute 0.0 0.0 - 0.5 K/uL   Basophils Relative 0 %   Basophils Absolute 0.0  0.0 - 0.1 K/uL   WBC Morphology DOHLE BODIES     Comment: VACUOLATED NEUTROPHILS   RBC Morphology MORPHOLOGY UNREMARKABLE     Comment: Performed at [facility], [address], [place], [place] [address]  Protime-INR     Status: Abnormal   Collection Time: 07/25/21  5:05 AM  Result Value Ref Range   Prothrombin Time 15.5 (H) 11.4 - 15.2 seconds   INR 1.2 0.8 - 1.2    Comment: (NOTE) INR goal varies based on device and disease states. Performed at [facility], [address], [place], [place] [address]   APTT     Status: None   Collection Time:  07/25/21  5:05 AM  Result Value Ref Range   aPTT 27 24 - 36 seconds    Comment: Performed at [facility], [address], [place], [place] [address]  Blood Culture (routine x 2)     Status: None (Preliminary result)   Collection Time: 07/25/21  5:05 AM   Specimen: Left Antecubital; Blood  Result Value Ref Range   Specimen Description LEFT ANTECUBITAL    Special Requests      BOTTLES DRAWN AEROBIC AND ANAEROBIC Blood Culture results may not be optimal due to an excessive volume of blood received in culture bottles   Culture      NO GROWTH < 12 HOURS Performed at [facility], [address] [place], [place] [address]    Report Status PENDING   Blood Culture (routine x 2)     Status: None (Preliminary result)   Collection Time: 07/25/21  5:05 AM   Specimen: Right Antecubital; Blood  Result Value Ref Range   Specimen Description RIGHT ANTECUBITAL    Special Requests      BOTTLES DRAWN AEROBIC AND ANAEROBIC Blood Culture adequate volume   Culture      NO GROWTH < 12 HOURS Performed at [facility], [address] [place], [place] [address]    Report Status PENDING   Type and screen [facility]     Status: None   Collection Time: 07/25/21  5:05 AM  Result Value Ref Range   ABO/RH(D) O POS    Antibody Screen NEG    Sample Expiration      08-24-2021,2359 Performed at [facility], [address] [place], [place] [address]   POC occult blood, ED     Status: Abnormal   Collection Time: 07/25/21  5:12 AM  Result Value Ref Range   Fecal Occult Bld POSITIVE (A) NEGATIVE  Urinalysis, Routine w reflex microscopic Urine, Catheterized     Status: Abnormal   Collection Time: 07/25/21  5:31 AM  Result Value Ref Range   Color, Urine AMBER (A) YELLOW    Comment: BIOCHEMICALS MAY BE AFFECTED BY COLOR   APPearance CLEAR CLEAR   Specific Gravity, Urine 1.025 1.005 - 1.030   pH 6.0 5.0 - 8.0   Glucose, UA NEGATIVE NEGATIVE mg/dL   Hgb urine dipstick TRACE (A) NEGATIVE   Bilirubin  Urine NEGATIVE NEGATIVE   Ketones, ur NEGATIVE NEGATIVE mg/dL   Protein, ur 30 (A) NEGATIVE mg/dL   Nitrite NEGATIVE NEGATIVE   Leukocytes,Ua NEGATIVE NEGATIVE    Comment: Performed at [facility], [address] [place], [place] [address]  Urinalysis, Microscopic (reflex)     Status: Abnormal   Collection Time: 07/25/21  5:31 AM  Result Value Ref Range   RBC / HPF 0-5 0 - 5 RBC/hpf   WBC, UA 0-5 0 - 5  WBC/hpf   Bacteria, UA FEW (A) NONE SEEN   Squamous Epithelial / LPF 0-5 0 - 5    Comment: Performed at [facility], [address], [place], [place] [address]  Procalcitonin - Baseline     Status: None   Collection Time: 07/25/21  5:31 AM  Result Value Ref Range   Procalcitonin 1.07 ng/mL    Comment:        Interpretation: PCT > 0.5 ng/mL and <= 2 ng/mL: Systemic infection (sepsis) is possible, but other conditions are known to elevate PCT as well. (NOTE)       Sepsis PCT Algorithm           Lower Respiratory Tract                                      Infection PCT Algorithm    ----------------------------     ----------------------------         PCT < 0.25 ng/mL                PCT < 0.10 ng/mL          Strongly encourage             Strongly discourage   discontinuation of antibiotics    initiation of antibiotics    ----------------------------     -----------------------------       PCT 0.25 - 0.50 ng/mL            PCT 0.10 - 0.25 ng/mL               OR       >80% decrease in PCT            Discourage initiation of                                            antibiotics      Encourage discontinuation           of antibiotics    ----------------------------     -----------------------------         PCT >= 0.50 ng/mL              PCT 0.26 - 0.50 ng/mL                AND       <80% decrease in PCT             Encourage initiation of                                             antibiotics       Encourage continuation           of antibiotics    ----------------------------      -----------------------------        PCT >= 0.50 ng/mL                  PCT > 0.50 ng/mL               AND         increase in PCT  Strongly encourage                                      initiation of antibiotics    Strongly encourage escalation           of antibiotics                                     -----------------------------                                           PCT <= 0.25 ng/mL                                                 OR                                        > 80% decrease in PCT                                      Discontinue / Do not initiate                                             antibiotics  Performed at [facility], [address], [place], [place] [address]   Lactic acid, plasma     Status: Abnormal   Collection Time: 07/25/21  6:52 AM  Result Value Ref Range   Lactic Acid, Venous 8.1 (HH) 0.5 - 1.9 mmol/L    Comment: CRITICAL RESULT CALLED TO, READ BACK BY AND VERIFIED WITH:  B.OAKLEY @ 0728 07/25/21 BY STEPHTR Performed at [facility], [address] [place], [place] [address]   Blood gas, venous (at [facility] and [facility], not at [facility])     Status: Abnormal   Collection Time: 07/25/21  7:15 AM  Result Value Ref Range   FIO2 21.00    pH, Ven 7.370 7.250 - 7.430   pCO2, Ven 28.9 (L) 44.0 - 60.0 mmHg   pO2, Ven 44.4 32.0 - 45.0 mmHg   Bicarbonate 18.1 (L) 20.0 - 28.0 mmol/L   Acid-base deficit 7.9 (H) 0.0 - 2.0 mmol/L   O2 Saturation 77.9 %   Patient temperature 36.4    Collection site BLOOD LEFT HAND    Drawn by [id number]    Sample type VENOUS     Comment: Performed at [facility], [address], [place],  [address]  MRSA Next Gen by PCR, Nasal     Status: None   Collection Time: 07/25/21  8:54 AM   Specimen: Nasal Mucosa; Nasal Swab  Result Value Ref Range   MRSA by PCR Next Gen NOT DETECTED NOT DETECTED    Comment: (NOTE) The GeneXpert MRSA Assay (FDA approved for NASAL specimens only), is one component of a  comprehensive MRSA colonization surveillance  program. It is not intended to diagnose MRSA infection nor to guide or monitor treatment for MRSA infections. Test performance is not FDA approved in patients less than 55 years old. Performed at [facility], [address], [place], [place] [address]   Blood gas, arterial     Status: Abnormal   Collection Time: 07/25/21  9:02 AM  Result Value Ref Range   FIO2 21.00    pH, Arterial 7.319 (L) 7.350 - 7.450   pCO2 arterial 26.7 (L) 32.0 - 48.0 mmHg   pO2, Arterial 68.9 (L) 83.0 - 108.0 mmHg   Bicarbonate 15.6 (L) 20.0 - 28.0 mmol/L   Acid-base deficit 11.6 (H) 0.0 - 2.0 mmol/L   O2 Saturation 91.0 %   Patient temperature 36.6    Collection site LEFT RADIAL    Drawn by [id number]    Sample type ARTERIAL    Allens test (pass/fail) PASS PASS    Comment: Performed at [facility], [address] [place], [place] [address]  Lactic acid, plasma     Status: Abnormal   Collection Time: 07/25/21  9:55 AM  Result Value Ref Range   Lactic Acid, Venous 8.0 (HH) 0.5 - 1.9 mmol/L    Comment: CRITICAL RESULT CALLED TO, READ BACK BY AND VERIFIED WITH:  A. SHELTON @ 1055 07/25/21 BY STEPHTR Performed at [facility], [address], [place], [place] [address]   Occult blood gastric / duodenum     Status: Abnormal   Collection Time: 07/25/21  8:06 PM  Result Value Ref Range   pH, Gastric 6    Occult Blood, Gastric POSITIVE (A) NEGATIVE    Comment: Performed at [facility], [address], [place], [place] [address]  Procalcitonin     Status: None   Collection Time: 07/26/21  5:10 AM  Result Value Ref Range   Procalcitonin 12.04 ng/mL    Comment:        Interpretation: PCT >= 10 ng/mL: Important systemic inflammatory response, almost exclusively due to severe bacterial sepsis or septic shock. (NOTE)       Sepsis PCT Algorithm           Lower Respiratory Tract                                      Infection PCT Algorithm    ----------------------------      ----------------------------         PCT < 0.25 ng/mL                PCT < 0.10 ng/mL          Strongly encourage             Strongly discourage   discontinuation of antibiotics    initiation of antibiotics    ----------------------------     -----------------------------       PCT 0.25 - 0.50 ng/mL            PCT 0.10 - 0.25 ng/mL               OR       >80% decrease in PCT            Discourage initiation of  antibiotics      Encourage discontinuation           of antibiotics    ----------------------------     -----------------------------         PCT >= 0.50 ng/mL              PCT 0.26 - 0.50 ng/mL                AND       <80% decrease in PCT             Encourage initiation of                                             antibiotics       Encourage continuation           of antibiotics    ----------------------------     -----------------------------        PCT >= 0.50 ng/mL                  PCT > 0.50 ng/mL               AND         increase in PCT                  Strongly encourage                                      initiation of antibiotics    Strongly encourage escalation           of antibiotics                                     -----------------------------                                           PCT <= 0.25 ng/mL                                                 OR                                        > 80% decrease in PCT                                      Discontinue / Do not initiate                                             antibiotics  Performed at [facility], [address], [place],  [address]   Comprehensive metabolic panel     Status: Abnormal   Collection Time: 07/26/21  5:10 AM  Result Value Ref Range   Sodium 140 135 - 145 mmol/L   Potassium 3.8 3.5 - 5.1 mmol/L   Chloride 112 (H) 98 - 111 mmol/L   CO2 19 (L) 22 - 32 mmol/L   Glucose, Bld 105 (H) 70 - 99 mg/dL    Comment: Glucose  reference range applies only to samples taken after fasting for at least 8 hours.   BUN 20 8 - 23 mg/dL   Creatinine, Ser 0.79 0.61 - 1.24 mg/dL   Calcium 9.4 8.9 - 10.3 mg/dL   Total Protein 5.5 (L) 6.5 - 8.1 g/dL   Albumin 2.5 (L) 3.5 - 5.0 g/dL   AST 2,195 (H) 15 - 41 U/L   ALT 2,041 (H) 0 - 44 U/L   Alkaline Phosphatase 130 (H) 38 - 126 U/L   Total Bilirubin 1.2 0.3 - 1.2 mg/dL   GFR, Estimated >60 >60 mL/min    Comment: (NOTE) Calculated using the CKD-EPI Creatinine Equation (2021)    Anion gap 9 5 - 15    Comment: Performed at [facility], [address], [place], [place] [address]  CBC     Status: Abnormal   Collection Time: 07/26/21  5:10 AM  Result Value Ref Range   WBC 75.2 (HH) 4.0 - 10.5 K/uL    Comment: This critical result has verified and been called to J. HAYSES by Joaquin Courts on 01 16 2023 at [phone], and has been read back.    RBC 3.78 (L) 4.22 - 5.81 MIL/uL   Hemoglobin 12.2 (L) 13.0 - 17.0 g/dL   HCT 36.6 (L) 39.0 - 52.0 %   MCV 96.8 80.0 - 100.0 fL   MCH 32.3 26.0 - 34.0 pg   MCHC 33.3 30.0 - 36.0 g/dL   RDW 12.8 11.5 - 15.5 %   Platelets 349 150 - 400 K/uL   nRBC 0.0 0.0 - 0.2 %    Comment: Performed at [facility], [address], [place], [place] [address]  TSH     Status: None   Collection Time: 07/26/21  5:10 AM  Result Value Ref Range   TSH 0.701 0.350 - 4.500 uIU/mL    Comment: Performed by a 3rd Generation assay with a functional sensitivity of <=0.01 uIU/mL. Performed at [facility], [address], [place], [place] [address]   T4, free     Status: None   Collection Time: 07/26/21  5:10 AM  Result Value Ref Range   Free T4 0.82 0.61 - 1.12 ng/dL    Comment: (NOTE) Biotin ingestion may interfere with free T4 tests. If the results are inconsistent with the TSH level, previous test results, or the clinical presentation, then consider biotin interference. If needed, order repeat testing after stopping biotin. Performed at [facility], [place] [address], [place], [place] [address]   CBC with Differential/Platelet     Status: Abnormal   Collection Time: 07/26/21  5:10 AM  Result Value Ref Range   WBC 74.5 (HH) 4.0 - 10.5 K/uL    Comment: REPEATED TO VERIFY THIS CRITICAL RESULT HAS VERIFIED AND BEEN CALLED TO B FOLLEY BY SHELBY VAILLANCOURT ON 01 16 2023 AT 0855, AND HAS BEEN READ BACK.     RBC 3.80 (L) 4.22 - 5.81 MIL/uL   Hemoglobin 12.3 (L) 13.0 - 17.0 g/dL   HCT 37.3 (L) 39.0 - 52.0 %   MCV 98.2 80.0 - 100.0 fL   MCH 32.4 26.0 - 34.0 pg  MCHC 33.0 30.0 - 36.0 g/dL   RDW 12.9 11.5 - 15.5 %   Platelets 350 150 - 400 K/uL   nRBC 0.0 0.0 - 0.2 %   Neutrophils Relative % 93 %   Neutro Abs 69.7 (H) 1.7 - 7.7 K/uL   Lymphocytes Relative 1 %   Lymphs Abs 0.8 0.7 - 4.0 K/uL   Monocytes Relative 3 %   Monocytes Absolute 1.8 (H) 0.1 - 1.0 K/uL   Eosinophils Relative 0 %   Eosinophils Absolute 0.0 0.0 - 0.5 K/uL   Basophils Relative 0 %   Basophils Absolute 0.1 0.0 - 0.1 K/uL   WBC Morphology TOXIC GRANULATION    RBC Morphology MORPHOLOGY UNREMARKABLE    Smear Review MORPHOLOGY UNREMARKABLE    Immature Granulocytes 3 %   Abs Immature Granulocytes 2.12 (H) 0.00 - 0.07 K/uL    Comment: Performed at [facility], [address], [place], [place] [address]  Reticulocytes     Status: Abnormal   Collection Time: 07/26/21  5:10 AM  Result Value Ref Range   Retic Ct Pct 2.5 0.4 - 3.1 %   RBC. 3.75 (L) 4.22 - 5.81 MIL/uL   Retic Count, Absolute 93.4 19.0 - 186.0 K/uL   Immature Retic Fract 36.9 (H) 2.3 - 15.9 %    Comment: Performed at [facility], [address], [place], [place] [address]  Lactate dehydrogenase     Status: Abnormal   Collection Time: 07/26/21  5:10 AM  Result Value Ref Range   LDH 2,193 (H) 98 - 192 U/L    Comment: Performed at [facility], [address], [place], [place] [address]  Hepatitis panel, acute     Status: None   Collection Time: 07/26/21  5:10 AM  Result Value Ref Range    Hepatitis B Surface Ag NON REACTIVE NON REACTIVE   HCV Ab NON REACTIVE NON REACTIVE    Comment: (NOTE) Nonreactive HCV antibody screen is consistent with no HCV infections,  unless recent infection is suspected or other evidence exists to indicate HCV infection.     Hep A IgM NON REACTIVE NON REACTIVE   Hep B C IgM NON REACTIVE NON REACTIVE    Comment: Performed at [facility], [place] [address] [place], [place] [address]  Lactic acid, plasma     Status: Abnormal   Collection Time: 07/26/21 11:55 AM  Result Value Ref Range   Lactic Acid, Venous 2.6 (HH) 0.5 - 1.9 mmol/L    Comment: CRITICAL VALUE NOTED.  VALUE IS CONSISTENT WITH PREVIOUSLY REPORTED AND CALLED VALUE. 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 EXAM: CT ABDOMEN AND PELVIS WITH CONTRAST TECHNIQUE: Multidetector CT imaging of the abdomen and pelvis was performed using the standard protocol following bolus administration of intravenous contrast. RADIATION DOSE REDUCTION: This exam was performed according to the departmental dose-optimization program which includes automated exposure control, adjustment of the mA and/or kV according to patient size and/or use of iterative reconstruction technique. CONTRAST:  160mL OMNIPAQUE IOHEXOL 300 MG/ML  SOLN COMPARISON:  07/20/2021 FINDINGS: Lower chest: New collapse/consolidation noted posterior left lower lobe. Hepatobiliary: Heterogeneous attenuation of liver parenchyma may be related to differential altered  perfusion or geographic fatty deposition. Gallbladder is distended with gallstones noted measuring up to about 9 mm.  No intrahepatic or extrahepatic biliary dilation. Pancreas: No focal mass lesion. No dilatation of the main duct. No intraparenchymal cyst. No peripancreatic edema. Spleen: Heterogeneous decreased perfusion. Adrenals/Urinary Tract: Nodular thickening left adrenal gland is stable. Right adrenal gland unremarkable. Bilateral renal cysts evident. No evidence for hydroureter. Foley catheter decompresses the urinary bladder. Stomach/Bowel: Stomach is fluid-filled and distended. Small hiatal hernia evident. Duodenum is distended and fluid-filled. Small bowel loops are diffusely fluid-filled. The terminal ileum is normal. The appendix is normal. Colon is fluid-filled and distended except in the left colon where there is advanced diverticular disease. No findings to suggest diverticulitis. Vascular/Lymphatic: There is advanced atherosclerotic calcification of the abdominal aorta without aneurysm. Celiac axis appears markedly attenuated and patency cannot be confirmed. SMA shows substantial calcific plaque at the origin possibly with possible opacification of a string like lumen. IMA appears to opacify. There is no gastrohepatic or hepatoduodenal ligament lymphadenopathy. No retroperitoneal or mesenteric lymphadenopathy. No pelvic sidewall lymphadenopathy. No pelvic sidewall lymphadenopathy. Reproductive: Prostate gland is mildly enlarged. Other: No intraperitoneal free fluid. Musculoskeletal: No worrisome lytic or sclerotic osseous abnormality. IMPRESSION: 1. New collapse/consolidation posterior left lower lobe. Pneumonia a concern. 2. 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 The celiac axis shows marked atherosclerotic disease and it is difficult to appreciate a patent lumen in the celiac axis and splenic artery given the degree of associated atherosclerotic calcification. Sagittal and coronal imaging suggests that there may be residual string like lumen. Splenic artery thrombosis is a concern. Similarly,  the SMA is poorly demonstrated and may also have marked stenotic disease. 3. Heterogeneous liver perfusion is new in the 1 week interval since prior study, potentially also related to differential low arterial perfusion. Geographic fatty deposition could also have this appearance although this would be relatively rapid interval appearance. 4. Fluid-filled, distended stomach, small bowel and colon. Imaging features are compatible with reported history of diarrhea. Currently there is no bowel wall thickening or pneumatosis to suggest bowel ischemia. No intraperitoneal free fluid. 5. Cholelithiasis. 6. Heterogeneous decreased perfusion of the liver. This may be related to differential altered perfusion or geographic fatty deposition. 7. 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 8. Prostatomegaly. 9. Aortic Atherosclerosis (ICD10-I70.0). Results of this study were discussed with Dr. Carles Collet at the time of initial interpretation. Electronically Signed   By: Misty Stanley M.D.   On: 07/26/2021 11:12   DG CHEST PORT 1 VIEW  Result Date: 07/25/2021 CLINICAL DATA:  Check NG placement EXAM: PORTABLE CHEST 1 VIEW COMPARISON:  Film from earlier in the same day. FINDINGS: Cardiac shadow is within normal limits. Aortic calcifications are again seen. Patchy infiltrate is noted in the left lung although improved compared with exam. Gastric catheter is noted coiled within the oropharynx. This should be withdrawn completely and readvanced. IMPRESSION: Gastric catheter coiled within the oropharynx. This should be withdrawn and readvanced. Improving aeration in the left lung. Electronically Signed   By: Inez Catalina M.D.   On: 07/25/2021 20:35   DG Chest Port 1 View  Result Date: 07/25/2021 CLINICAL DATA:  Questionable sepsis EXAM: PORTABLE CHEST 1 VIEW COMPARISON:  Five days ago FINDINGS: New airspace disease on the left. Clear right lung. Normal heart size and mediastinal contours.  Extensive artifact from EKG leads. IMPRESSION: New, extensive pneumonia on the left. Electronically Signed   By: Jorje Guild M.D.   On: 07/25/2021  05:27   DG Chest Port 1V same Day  Result Date: 07/25/2021 CLINICAL DATA:  Enteric catheter repositioning, emesis EXAM: PORTABLE CHEST 1 VIEW COMPARISON:  07/25/2021 FINDINGS: Frontal view of the chest demonstrates 3 position of the enteric catheter, tip and side port projecting over the distal thoracic esophagus. Recommend advancing at least 6 cm to ensure side port placement within the gastric lumen. The cardiac silhouette is stable. Diffuse interstitial prominence, with stable left basilar airspace disease. No effusion or pneumothorax. No acute bony abnormality. IMPRESSION: 1. Enteric catheter tip and side port projecting over the distal thoracic esophagus. Recommend advancing 6 cm. 2. Stable left basilar airspace disease. Electronically Signed   By: Randa Ngo M.D.   On: 07/25/2021 20:51   ECHOCARDIOGRAM COMPLETE  Result Date: 07/25/2021    ECHOCARDIOGRAM REPORT   Patient Name:   Peter [name] Date of Exam: 07/25/2021 Medical Rec #:  [id number]      Height:       70.0 in Accession #:    [id number]     Weight:       160.3 lb Date of Birth:  Dec 10, 1932      BSA:          1.900 m Patient Age:    37 years       BP:           135/76 mmHg Patient Gender: M              HR:           101 bpm. Exam Location:  [facility] Procedure: 2D Echo, Cardiac Doppler and Color Doppler Indications:    I48.92* Unspecified atrial flutter  History:        Patient has prior history of Echocardiogram examinations, most                 recent 03/08/2017. Abnormal ECG, COPD, Arrythmias:Atrial Flutter,                 Signs/Symptoms:Dyspnea and Shortness of Breath; Risk                 Factors:Hypertension, Dyslipidemia and Current Smoker.  Sonographer:    Roseanna Rainbow RDCS Referring Phys: [phone] DAVID TAT  Sonographer Comments: Technically difficult study due to poor echo windows. Image  acquisition challenging due to respiratory motion. IMPRESSIONS  1. Left ventricular ejection fraction, by estimation, is 60 to 65%. The left ventricle has normal function. The left ventricle has no regional wall motion abnormalities. Left ventricular diastolic parameters are consistent with Grade I diastolic dysfunction (impaired relaxation).  2. Right ventricular systolic function is normal. The right ventricular size is normal.  3. Interatrial septal lipoma.  4. The mitral valve is grossly normal. No evidence of mitral valve regurgitation.  5. Focal calcification. The aortic valve was not well visualized. Aortic valve regurgitation is not visualized.  6. The inferior vena cava is dilated in size with >50% respiratory variability, suggesting right atrial pressure of 8 mmHg. FINDINGS  Left Ventricle: Left ventricular ejection fraction, by estimation, is 60 to 65%. The left ventricle has normal function. The left ventricle has no regional wall motion abnormalities. The left ventricular internal cavity size was normal in size. There is  no left ventricular hypertrophy. Left ventricular diastolic parameters are consistent with Grade I diastolic dysfunction (impaired relaxation). Right Ventricle: The right ventricular size is normal. No increase in right ventricular wall thickness. Right ventricular systolic function is normal. Left Atrium: Left atrial size  was normal in size. Right Atrium: Right atrial size was normal in size. Pericardium: There is no evidence of pericardial effusion. Mitral Valve: The mitral valve is grossly normal. There is mild calcification of the mitral valve leaflet(s). No evidence of mitral valve regurgitation. Tricuspid Valve: The tricuspid valve is not well visualized. Tricuspid valve regurgitation is not demonstrated. Aortic Valve: Focal calcification. The aortic valve was not well visualized. Aortic valve regurgitation is not visualized. Pulmonic Valve: The pulmonic valve was not well  visualized. Pulmonic valve regurgitation is not visualized. Aorta: The aortic root and ascending aorta are structurally normal, with no evidence of dilitation. Venous: The inferior vena cava is dilated in size with greater than 50% respiratory variability, suggesting right atrial pressure of 8 mmHg. IAS/Shunts: No atrial level shunt detected by color flow Doppler.  LEFT VENTRICLE PLAX 2D LVIDd:         4.10 cm     Diastology LVIDs:         2.90 cm     LV e' medial:    5.22 cm/s LV PW:         1.00 cm     LV E/e' medial:  16.9 LV IVS:        1.10 cm     LV e' lateral:   6.09 cm/s LVOT diam:     2.30 cm     LV E/e' lateral: 14.5 LV SV:         95 LV SV Index:   50 LVOT Area:     4.15 cm  LV Volumes (MOD) LV vol d, MOD A2C: 59.4 ml LV vol d, MOD A4C: 87.2 ml LV vol s, MOD A2C: 23.1 ml LV vol s, MOD A4C: 30.3 ml LV SV MOD A2C:     36.3 ml LV SV MOD A4C:     87.2 ml LV SV MOD BP:      45.8 ml RIGHT VENTRICLE             IVC RV S prime:     16.60 cm/s  IVC diam: 2.40 cm TAPSE (M-mode): 2.7 cm LEFT ATRIUM           Index        RIGHT ATRIUM           Index LA diam:      3.50 cm 1.84 cm/m   RA Area:     14.30 cm LA Vol (A2C): 26.6 ml 14.00 ml/m  RA Volume:   33.30 ml  17.53 ml/m LA Vol (A4C): 36.6 ml 19.26 ml/m  AORTIC VALVE LVOT Vmax:   114.00 cm/s LVOT Vmean:  72.900 cm/s LVOT VTI:    0.227 m  AORTA Ao Root diam: 3.60 cm Ao Asc diam:  3.50 cm MITRAL VALVE MV Area (PHT): 3.82 cm     SHUNTS MV Decel Time: 198 msec     Systemic VTI:  0.23 m MV E velocity: 88.10 cm/s   Systemic Diam: 2.30 cm MV A velocity: 129.00 cm/s MV E/A ratio:  0.68 Peter [name], physiological signed by Phineas Inches Signature Date/Time: 07/25/2021/4:49:27 PM    Final    US Abdomen Limited RUQ (LIVER/GB)  Result Date: 07/25/2021 CLINICAL DATA:  Abdominal pain EXAM: ULTRASOUND ABDOMEN LIMITED RIGHT UPPER QUADRANT COMPARISON:  12/20/2016 FINDINGS: Gallbladder: Gallbladder stones are seen. There is no abnormal wall thickening. Technologist did not  observe any tenderness over the gallbladder. There is no fluid around the gallbladder. Common bile duct: Diameter: 2.4 mm Liver:  There is increased echogenicity. No focal abnormality is seen. Portal vein is patent on color Doppler imaging with normal direction of blood flow towards the liver. Other: None. IMPRESSION: Gallbladder stones. There are no signs of acute cholecystitis. Fatty liver. Electronically Signed   By: Elmer Picker M.D.   On: 07/25/2021 10:22     Assessment & Plan:  SIR MALLIS is a 88 y.o. 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 He has no overt signs of ischemia yet to his intestine but discussed the extent of the vascular disease, the option of intubating, anesthesia and induction leading to hypotension, worsening/ propagating the ischemia, and overall risk of any intervention.  The ACS calculator rates him at over 85% chance of death with surgery which is not even taking into acute the recent pneumonia, steroid use, the thrombosis, and I am afraid with any induction what is being perfused with stop and his intestines will all be compromised, which is not survivable.   Heparin gtt is potentially helpful but not sure that it will allow him sufficient arterial flow to sustain digestion and once he eats he may become ischemic and continue to cycle  Discussed with Dr. Carles Collet and family at length. Given the overall risk and the unlikelihood of survival would not recommend surgery.  Heparin can be tried but would have a guarded suspicion for any longterm changes in the prognosis.   Discussed option of Comfort Care/ Hospice at home with family.   Greater than 50% of the 80 minute visit was spent in counseling/ coordination of care regarding the patient's disease process, reviewing the imaging, making a plan and  discussing things with the family.     Virl Cagey 07/26/2021, 2:41 PM

## 2021-07-26 NOTE — Consult Note (Addendum)
Referring Provider: Dr. Carles Collet  Primary Care Physician:  Redmond School, MD Primary Gastroenterologist:  Dr. Gala Romney  Date of Admission: 07/25/21 Date of Consultation:  07/26/21  Reason for Consultation:  Coffee ground emesis   HPI:  LARWENCE TU is an 88 y.o. year old male with history of COPD, PAD, CAD, chronic dysphagia requiring multiple dilations in the past (Schatzki ring), recently inpatient 1/10-1/12 with diarrhea, dehydration, and aspiration in setting of dysphagia. GI path panel with norovirus and Cdiff negative. He had been exposed to antibiotics a month prior. At time of discharge, he had no diarrhea and was having formed BMs. Presented yesterday to the ED with recurrent diarrhea, increased shortness of breath, and had near syncopal episode when getting up to go to the bathroom prior to admission and EMS was called. Febrile on presentation to ED with Temp 101.9 and tachycardic. Admitted with sepsis, aspiration pneumonia, acute respiratory failure with hypoxia, diarrhea and abdominal pain. No stool in past 24 hours. CT ordered. Now with markedly elevated transaminases, with AST 2195 and ALT 2041. RUQ [place] with gallstones. Fatty liver. Profound leukocytosis with WBC count 74.5 (repeated labs to ensure correct). Admission WBC count 34.5. GI consulted due to coffee-ground emesis overnight.   Patient and family state he had been spitting up "dark stuff". Coffee-ground emesis yesterday evening. NG tube was inserted with dark brown output. NG tube pulled out by patient but unable to be positioned correctly. Patient appears acutely ill. Notes shortness of breath. Family notes recurrent watery stools after discharge while on Amoxicillin. Denies any NSAIDs other than 81 mg aspirin daily. Denies recent tylenol usage. No OTC herbal supplements. No ETOH use.. No anticoagulation. PPI daily as outpatient. He had abdominal cramping at home but denies abdominal pain now. No N/V currently. No diarrhea since  admission; stool studies unable to be obtained. He has chronic dysphagia with last EGD 2019: obstructing Schatzki ring /ps dilation, medium-sized hiatal hernia.   Speech saw patient yesterday with plans for MBSS if appropriate from respiratory standpoint.   CT abd/pelvis ordered by hospitalist.   Prior CT chest/abd/pelvis with contrast Jul 20, 2021 did note extensive vascular calcifications throughout abd/pelvis resulting in high-grade stenosis of celiac, SMA, bilateral renal arteries, and bilateral ileofemoral vessels. He did have debris in trachea and airways concerning for aspiration.     Past Medical History:  Diagnosis Date   Anxiety    Arthritis    BPH (benign prostatic hyperplasia)    Chronic pain    Complication of anesthesia    had an anxiety attack once   COPD (chronic obstructive pulmonary disease) (HCC)    Depression    GERD (gastroesophageal reflux disease)    Hiatal hernia    small   History of nuclear stress test    Nuc 5/19: soft tissue attenuation; no ischemia or scar, EF 72; Low Risk   HTN (hypertension)    Internal carotid artery stenosis, left    Pneumonia    Prostate disease    h/o elevated PSA, neg bx, [facility]   Sciatica    Wears glasses    Wears partial dentures     Past Surgical History:  Procedure Laterality Date   ABDOMINAL AORTOGRAM W/LOWER EXTREMITY N/A 10/26/2017   Procedure: ABDOMINAL AORTOGRAM W/LOWER EXTREMITY;  Surgeon: Waynetta Sandy, MD;  Location: [facility];  Service: Cardiovascular;  Laterality: N/A;  bilateral   BALLOON DILATION  04/23/2018   Procedure: BALLOON DILATION;  Surgeon: Daneil Dolin, MD;  [facility];  Service: Endoscopy;;  esophagus   BIOPSY  01/29/2014   Procedure: BIOPSY;  Surgeon: Daneil Dolin, MD;  [facility];  Service: Endoscopy;;  Gastric and Esophageal   BIOPSY  06/14/2018   Procedure: BIOPSY;  Surgeon: Daneil Dolin, MD;  [facility];  Service:  Endoscopy;;  esophagus   CAROTID ENDARTERECTOMY Left 03/20/2017   CATARACT EXTRACTION W/PHACO  05/21/2012   Procedure: CATARACT EXTRACTION PHACO AND INTRAOCULAR LENS PLACEMENT ([place]);  Surgeon: Tonny Branch, MD;  Location: [facility];  Service: Ophthalmology;  Laterality: Right;  CDE 19.34   COLONOSCOPY  12/2004   diverticulosis, hyperplastic polyps, hemorrhoids   ENDARTERECTOMY Left 03/20/2017   Procedure: ENDARTERECTOMY CAROTID-LEFT;  Surgeon: Conrad Corona, MD;  Location: [facility];  Service: Vascular;  Laterality: Left;   ESOPHAGOGASTRODUODENOSCOPY  2003   schatzki ring, small vascular abnormality of duodenal bulb ?Dieulafoy's lesion (ablated)   ESOPHAGOGASTRODUODENOSCOPY  11/10/10   Dr. Gala Romney dilation 2 rings with 55F   ESOPHAGOGASTRODUODENOSCOPY N/A 01/29/2014   Dr.Rourk- multiple esophageal rings/webs- s/p dialation. small hiatal hernia. abnormal gastric mucosa. bx= mild chronic inactive gastritis (oxyntic mucosa), squamous mucosa with epithelial changes consistent with reflux related injury.   ESOPHAGOGASTRODUODENOSCOPY (EGD) WITH PROPOFOL N/A 04/23/2018   Procedure: ESOPHAGOGASTRODUODENOSCOPY (EGD) WITH PROPOFOL;  Surgeon: Daneil Dolin, MD;  [facility];  Service: Endoscopy;  Laterality: N/A;  2:00pm   ESOPHAGOGASTRODUODENOSCOPY (EGD) WITH PROPOFOL N/A 06/14/2018   Dr. Gala Romney: Obstructing Schatzki ring, status post dilation medium-sized hiatal hernia, biopsies from the mid to distal esophagus obtained which were benign.   MALONEY DILATION N/A 06/14/2018   Procedure: Venia Minks DILATION;  Surgeon: Daneil Dolin, MD;  [facility];  Service: Endoscopy;  Laterality: N/A;   MULTIPLE TOOTH EXTRACTIONS     PATCH ANGIOPLASTY Left 03/20/2017   PATCH ANGIOPLASTY Left 03/20/2017   Procedure: PATCH ANGIOPLASTY;  Surgeon: Conrad , MD;  Location: [place];  Service: Vascular;  Laterality: Left;    Prior to Admission medications   Medication Sig Start Date End Date Taking? Authorizing  Provider  acetaminophen (TYLENOL) 325 MG tablet Take 2 tablets (650 mg total) by mouth every 6 (six) hours as needed for mild pain, fever or headache (or Fever >/= 101). 07/22/21  Yes Emokpae, Courage, MD  albuterol (VENTOLIN HFA) 108 (90 Base) MCG/ACT inhaler Inhale 1-2 puffs into the lungs every 6 (six) hours as needed for wheezing or shortness of breath. 07/22/21  Yes Emokpae, Courage, MD  ALPRAZolam Duanne Moron) 0.5 MG tablet Take 0.5 mg by mouth 2 (two) times daily as needed for anxiety.   Yes [provider]  amLODipine (NORVASC) 5 MG tablet Take 5 mg by mouth daily.  11/28/16  Yes [provider]  amoxicillin-clavulanate (AUGMENTIN) 875-125 MG tablet Take 1 tablet by mouth 2 (two) times daily for 5 days. 07/22/21 07/27/21 Yes Roxan Hockey, MD  aspirin EC 81 MG EC tablet Take 1 tablet (81 mg total) by mouth daily with breakfast. 07/22/21  Yes Emokpae, Courage, MD  finasteride (PROSCAR) 5 MG tablet Take 1 tablet (5 mg total) by mouth daily after lunch. 07/22/21  Yes Emokpae, Courage, MD  lisinopril (ZESTRIL) 40 MG tablet Take 1 tablet (40 mg total) by mouth daily. 07/22/21  Yes Emokpae, Courage, MD  mometasone-formoterol (DULERA) 200-5 MCG/ACT AERO Inhale 2 puffs into the lungs 2 (two) times daily. 07/22/21  Yes Emokpae, Courage, MD  pantoprazole (PROTONIX) 40 MG tablet Take 1 tablet (40 mg total)  by mouth daily. TAKE (1) TABLET BY MOUTH ONCE DAILY BEFORE BREAKFAST. Strength: 40 mg 07/22/21  Yes Emokpae, Courage, MD  Potassium Chloride ER 20 MEQ TBCR Take 20 mEq by mouth daily. 1 tab daily by mouth 07/22/21  Yes Emokpae, Courage, MD  ondansetron (ZOFRAN) 4 MG tablet Take 1 tablet (4 mg total) by mouth every 6 (six) hours as needed for nausea. Patient not taking: Reported on 07/25/2021 07/22/21   Roxan Hockey, MD  polyethylene glycol (MIRALAX / GLYCOLAX) 17 g packet Take 17 g by mouth daily as needed for mild constipation. Patient not taking: Reported on 07/25/2021 07/22/21   Roxan Hockey, MD    Current Facility-Administered Medications  Medication Dose Route Frequency Provider Last Rate Last Admin   acetaminophen (TYLENOL) tablet 650 mg  650 mg Oral Q6H PRN Tat, Shanon Brow, MD       Or   acetaminophen (TYLENOL) suppository 650 mg  650 mg Rectal Q6H PRN Tat, Shanon Brow, MD       ALPRAZolam Duanne Moron) tablet 0.5 mg  0.5 mg Oral BID PRN Tat, Shanon Brow, MD       budesonide (PULMICORT) nebulizer solution 0.5 mg  0.5 mg Nebulization BID Tat, David, MD   0.5 mg at 07/26/21 2595   chlorhexidine (PERIDEX) 0.12 % solution 15 mL  15 mL Mouth Rinse BID Adefeso, Oladapo, DO   15 mL at 07/26/21 0835   Chlorhexidine Gluconate Cloth 2 % PADS 6 each  6 each Topical Q0600 Tat, Shanon Brow, MD   6 each at 07/26/21 0656   finasteride (PROSCAR) tablet 5 mg  5 mg Oral QPC lunch Tat, Shanon Brow, MD   5 mg at 07/25/21 1329   ipratropium-albuterol (DUONEB) 0.5-2.5 (3) MG/3ML nebulizer solution 3 mL  3 mL Nebulization Q6H Tat, Shanon Brow, MD   3 mL at 07/26/21 0722   labetalol (NORMODYNE) injection 5 mg  5 mg Intravenous Q6H PRN Adefeso, Oladapo, DO   5 mg at 07/26/21 0644   MEDLINE mouth rinse  15 mL Mouth Rinse q12n4p Adefeso, Oladapo, DO       methylPREDNISolone sodium succinate (SOLU-MEDROL) 125 mg/2 mL injection 60 mg  60 mg Intravenous Therisa Doyne, MD   60 mg at 07/26/21 6387   nicotine (NICODERM CQ - dosed in mg/24 hours) patch 21 mg  21 mg Transdermal Daily Adefeso, Oladapo, DO   21 mg at 07/26/21 0835   ondansetron (ZOFRAN) tablet 4 mg  4 mg Oral Q6H PRN Tat, Shanon Brow, MD       Or   ondansetron (ZOFRAN) injection 4 mg  4 mg Intravenous Q6H PRN Tat, Shanon Brow, MD       Derrill Memo ON 07/29/2021] pantoprazole (PROTONIX) injection 40 mg  40 mg Intravenous Q12H Tat, Shanon Brow, MD       pantoprozole (PROTONIX) 80 mg /NS 100 mL infusion  8 mg/hr Intravenous Continuous Tat, Shanon Brow, MD 10 mL/hr at 07/26/21 0938 8 mg/hr at 07/26/21 0938   piperacillin-tazobactam (ZOSYN) IVPB 3.375 g  3.375 g Intravenous Franco Collet, MD 12.5 mL/hr at  07/26/21 0651 3.375 g at 07/26/21 5643   Facility-Administered Medications Ordered in Other Encounters  Medication Dose Route Frequency Provider Last Rate Last Admin   shugarcaine ophthalmic solution    PRN Tonny Branch, MD   Given at 05/21/12 0800    Allergies as of 07/25/2021   (No Known Allergies)    Family History  Problem Relation Age of Onset   Stroke Father 67       deceased  Heart attack Mother    Colon cancer Neg Hx     Social History   Socioeconomic History   Marital status: Widowed    Spouse name: Not on file   Number of children: 1   Years of education: Not on file   Highest education level: Not on file  Occupational History   Occupation: retired    Comment: Orthoptist  Tobacco Use   Smoking status: Every Day    Packs/day: 1.00    Years: 60.00    Pack years: 60.00    Types: Cigarettes   Smokeless tobacco: Never  Vaping Use   Vaping Use: Never used  Substance and Sexual Activity   Alcohol use: No    Comment: quit 10  years ago   Drug use: No   Sexual activity: Not Currently    Partners: Female  Other Topics Concern   Not on file  Social History Narrative   Pt reports that he has aides 24 hours a day,    Social Determinants of Radio broadcast assistant Strain: Not on file  Food Insecurity: Not on file  Transportation Needs: Not on file  Physical Activity: Not on file  Stress: Not on file  Social Connections: Not on file  Intimate Partner Violence: Not on file    Review of Systems: See HPI  Physical Exam: Vital signs in last 24 hours: Temp:  [96.6 F (35.9 C)-96.8 F (36 C)] 96.8 F (36 C) (01/16 0700) Pulse Rate:  [85-110] 99 (01/16 0800) Resp:  [19-38] 31 (01/16 0800) BP: (124-209)/(53-76) 168/59 (01/16 0800) SpO2:  [90 %-100 %] 96 % (01/16 0800) Last BM Date: 07/24/21 General:   Alert,  frail, acutely ill-appearing Head:  Normocephalic and atraumatic. Eyes:  Sclera clear, no icterus.   Ears:  mild hard of hearing Nose:  No  deformity, discharge,  or lesions. Mouth:  poor dentition Lungs:  extensive rhonchi bilaterally, use of accessory muscles for breathing Heart:  S1 S2 present, tachycardic Abdomen:  mildly distended but soft, hypoactive bowel sounds, TTP lower abdomen without rebound or guarding Rectal:  Deferred  Extremities:  Without edema. Neurologic:  Alert and  oriented x4   Intake/Output from previous day: 01/15 0701 - 01/16 0700 In: 3602 [P.O.:120; I.V.:1020.3; IV Piggyback:2461.6] Out: 1350 [Urine:1350] Intake/Output this shift: No intake/output data recorded.  Lab Results: Recent Labs    07/25/21 0505 07/26/21 0510  WBC 34.5* 74.5*   75.2*  HGB 15.4 12.3*   12.2*  HCT 46.0 37.3*   36.6*  PLT 422* 350   349   BMET Recent Labs    07/25/21 0505 07/26/21 0510  NA 137 140  K 3.5 3.8  CL 105 112*  CO2 17* 19*  GLUCOSE 196* 105*  BUN 15 20  CREATININE 1.22 0.79  CALCIUM 10.1 9.4   LFT Recent Labs    07/25/21 0505 07/26/21 0510  PROT 7.0 5.5*  ALBUMIN 3.1* 2.5*  AST 60* 2,195*  ALT 52* 2,041*  ALKPHOS 93 130*  BILITOT 0.8 1.2   PT/INR Recent Labs    07/25/21 0505  LABPROT 15.5*  INR 1.2    Studies/Results: DG CHEST PORT 1 VIEW  Result Date: 07/25/2021 CLINICAL DATA:  Check NG placement EXAM: PORTABLE CHEST 1 VIEW COMPARISON:  Film from earlier in the same day. FINDINGS: Cardiac shadow is within normal limits. Aortic calcifications are again seen. Patchy infiltrate is noted in the left lung although improved compared with exam. Gastric catheter is noted coiled within  the oropharynx. This should be withdrawn completely and readvanced. IMPRESSION: Gastric catheter coiled within the oropharynx. This should be withdrawn and readvanced. Improving aeration in the left lung. Electronically Signed   By: Inez Catalina M.D.   On: 07/25/2021 20:35   DG Chest Port 1 View  Result Date: 07/25/2021 CLINICAL DATA:  Questionable sepsis EXAM: PORTABLE CHEST 1 VIEW COMPARISON:  Five  days ago FINDINGS: New airspace disease on the left. Clear right lung. Normal heart size and mediastinal contours. Extensive artifact from EKG leads. IMPRESSION: New, extensive pneumonia on the left. Electronically Signed   By: Jorje Guild M.D.   On: 07/25/2021 05:27   DG Chest Port 1V same Day  Result Date: 07/25/2021 CLINICAL DATA:  Enteric catheter repositioning, emesis EXAM: PORTABLE CHEST 1 VIEW COMPARISON:  07/25/2021 FINDINGS: Frontal view of the chest demonstrates 3 position of the enteric catheter, tip and side port projecting over the distal thoracic esophagus. Recommend advancing at least 6 cm to ensure side port placement within the gastric lumen. The cardiac silhouette is stable. Diffuse interstitial prominence, with stable left basilar airspace disease. No effusion or pneumothorax. No acute bony abnormality. IMPRESSION: 1. Enteric catheter tip and side port projecting over the distal thoracic esophagus. Recommend advancing 6 cm. 2. Stable left basilar airspace disease. Electronically Signed   By: Randa Ngo M.D.   On: 07/25/2021 20:51   ECHOCARDIOGRAM COMPLETE  Result Date: 07/25/2021    ECHOCARDIOGRAM REPORT   Patient Name:   Peter [name] Date of Exam: 07/25/2021 Medical Rec #:  [id number]      Height:       70.0 in Accession #:    [id number]     Weight:       160.3 lb Date of Birth:  [birth year]      BSA:          1.900 m Patient Age:    45 years       BP:           135/76 mmHg Patient Gender: M              HR:           101 bpm. Exam Location:  [facility] Procedure: 2D Echo, Cardiac Doppler and Color Doppler Indications:    I48.92* Unspecified atrial flutter  History:        Patient has prior history of Echocardiogram examinations, most                 recent 03/08/2017. Abnormal ECG, COPD, Arrythmias:Atrial Flutter,                 Signs/Symptoms:Dyspnea and Shortness of Breath; Risk                 Factors:Hypertension, Dyslipidemia and Current Smoker.  Sonographer:    Roseanna Rainbow  RDCS Referring Phys: [phone] DAVID TAT  Sonographer Comments: Technically difficult study due to poor echo windows. Image acquisition challenging due to respiratory motion. IMPRESSIONS  1. Left ventricular ejection fraction, by estimation, is 60 to 65%. The left ventricle has normal function. The left ventricle has no regional wall motion abnormalities. Left ventricular diastolic parameters are consistent with Grade I diastolic dysfunction (impaired relaxation).  2. Right ventricular systolic function is normal. The right ventricular size is normal.  3. Interatrial septal lipoma.  4. The mitral valve is grossly normal. No evidence of mitral valve regurgitation.  5. Focal calcification. The aortic valve was not well visualized. Aortic valve  regurgitation is not visualized.  6. The inferior vena cava is dilated in size with >50% respiratory variability, suggesting right atrial pressure of 8 mmHg. FINDINGS  Left Ventricle: Left ventricular ejection fraction, by estimation, is 60 to 65%. The left ventricle has normal function. The left ventricle has no regional wall motion abnormalities. The left ventricular internal cavity size was normal in size. There is  no left ventricular hypertrophy. Left ventricular diastolic parameters are consistent with Grade I diastolic dysfunction (impaired relaxation). Right Ventricle: The right ventricular size is normal. No increase in right ventricular wall thickness. Right ventricular systolic function is normal. Left Atrium: Left atrial size was normal in size. Right Atrium: Right atrial size was normal in size. Pericardium: There is no evidence of pericardial effusion. Mitral Valve: The mitral valve is grossly normal. There is mild calcification of the mitral valve leaflet(s). No evidence of mitral valve regurgitation. Tricuspid Valve: The tricuspid valve is not well visualized. Tricuspid valve regurgitation is not demonstrated. Aortic Valve: Focal calcification. The aortic valve was  not well visualized. Aortic valve regurgitation is not visualized. Pulmonic Valve: The pulmonic valve was not well visualized. Pulmonic valve regurgitation is not visualized. Aorta: The aortic root and ascending aorta are structurally normal, with no evidence of dilitation. Venous: The inferior vena cava is dilated in size with greater than 50% respiratory variability, suggesting right atrial pressure of 8 mmHg. IAS/Shunts: No atrial level shunt detected by color flow Doppler.  LEFT VENTRICLE PLAX 2D LVIDd:         4.10 cm     Diastology LVIDs:         2.90 cm     LV e' medial:    5.22 cm/s LV PW:         1.00 cm     LV E/e' medial:  16.9 LV IVS:        1.10 cm     LV e' lateral:   6.09 cm/s LVOT diam:     2.30 cm     LV E/e' lateral: 14.5 LV SV:         95 LV SV Index:   50 LVOT Area:     4.15 cm  LV Volumes (MOD) LV vol d, MOD A2C: 59.4 ml LV vol d, MOD A4C: 87.2 ml LV vol s, MOD A2C: 23.1 ml LV vol s, MOD A4C: 30.3 ml LV SV MOD A2C:     36.3 ml LV SV MOD A4C:     87.2 ml LV SV MOD BP:      45.8 ml RIGHT VENTRICLE             IVC RV S prime:     16.60 cm/s  IVC diam: 2.40 cm TAPSE (M-mode): 2.7 cm LEFT ATRIUM           Index        RIGHT ATRIUM           Index LA diam:      3.50 cm 1.84 cm/m   RA Area:     14.30 cm LA Vol (A2C): 26.6 ml 14.00 ml/m  RA Volume:   33.30 ml  17.53 ml/m LA Vol (A4C): 36.6 ml 19.26 ml/m  AORTIC VALVE LVOT Vmax:   114.00 cm/s LVOT Vmean:  72.900 cm/s LVOT VTI:    0.227 m  AORTA Ao Root diam: 3.60 cm Ao Asc diam:  3.50 cm MITRAL VALVE MV Area (PHT): 3.82 cm     SHUNTS MV Decel Time:  198 msec     Systemic VTI:  0.23 m MV E velocity: 88.10 cm/s   Systemic Diam: 2.30 cm MV A velocity: 129.00 cm/s MV E/A ratio:  0.68 Phineas Inches Electronically signed by Phineas Inches Signature Date/Time: 07/25/2021/4:49:27 PM    Final    US Abdomen Limited RUQ (LIVER/GB)  Result Date: 07/25/2021 CLINICAL DATA:  Abdominal pain EXAM: ULTRASOUND ABDOMEN LIMITED RIGHT UPPER QUADRANT COMPARISON:   12/20/2016 FINDINGS: Gallbladder: Gallbladder stones are seen. There is no abnormal wall thickening. Technologist did not observe any tenderness over the gallbladder. There is no fluid around the gallbladder. Common bile duct: Diameter: 2.4 mm Liver: There is increased echogenicity. No focal abnormality is seen. Portal vein is patent on color Doppler imaging with normal direction of blood flow towards the liver. Other: None. IMPRESSION: Gallbladder stones. There are no signs of acute cholecystitis. Fatty liver. Electronically Signed   By: Elmer Picker M.D.   On: 07/25/2021 10:22    Impression: 88 year old male with history of COPD, PAD, CAD, chronic dysphagia requiring multiple dilations in the past (last in 2019), recently inpatient 1/10-1/12 with diarrhea, dehydration, and aspiration in setting of dysphagia. GI path panel with norovirus and Cdiff negative at that time; he had been exposed to antibiotics a month prior. At time of discharge, he had no diarrhea and was having formed BMs. Now presenting this admission with recurrent diarrhea, increased shortness of breath, and near syncopal episode yesterday prior to admission. He has now been admitted with sepsis, aspiration pneumonia, acute respiratory failure with hypoxia, diarrhea and abdominal pain. Stool studies unable to be completed, as he has had cessation of stool. Now with markedly elevated transaminases, with AST 2195 and ALT 2041. RUQ [place] with gallstones. Fatty liver. Profound leukocytosis with WBC count 74.5 (repeated labs to ensure correct). Admission WBC count 34.5. GI consulted due to coffee-ground emesis overnight.  Coffee ground emesis: last EGD in 2019. Denies NSAIDs other than 81 mg aspirin as outpatient. PPI as outpatient. No melena. Hgb 12.3, similar to baseline. Not a candidate for EGD with respiratory status but can pursue prior to discharge. At risk for PUD.   Dysphagia: chronic. Noted improvement with dilations historically. Can  consider EGD/dilation prior to discharge. Appreciate Speech following while inpatient and would recommend MBSS once stable from respiratory standpoint.  Elevated transaminases: markedly elevated with AST 2195 (previously 60 on admission) and ALT 2041 (previously 52 on admission). Prior LFTs a week ago normal. RUQ [place] with gallstones. In light of his pre-syncopal episode prior to admission, suspect this is related to ischemic etiology. Would expect to reach peak and then rapidly fall if ischemic etiology. Doubt drug-induced. Denies ETOH, tylenol, OTC herbal supplements. INR normal.   Diarrhea: prior admission last week with norovirus on GI path. Negative Cdiff. He was discharged on Augmentin and developed diarrhea again as outpatient, now with cessation of diarrhea this admission. Recommend stool studies if diarrhea. CT in process today; patient denies abdominal pain at time of consultation. Concern for Cdiff in light of antibiotic exposure.   Leukocytosis: profound with WBC count 74.5, increased from 34.5 yesterday. Blood cultures in process. Multifactorial in light of pneumonia, possible diarrheal illness contributing. Would keep Cdiff as etiology for diarrhea especially in light of his antibiotic exposure, older adult, vulnerable health status. CT pending.   Abdominal pain: denies abdominal pain at time of consultation. If he were to have marked abdominal pain, would recommend CTA. Extensive vasculature disease noted on prior CT chest/abd/pelvis on 07/20/21.  High-grade stenosis of celiac and SMA noted.   Pancreatic lesion:  Seen on CT 1/10 subcentimeter cystic lesion in pancreatic tail, ?IPMN. Can follow as outpatient.    Plan: Continue Protonix infusion Stool studies if recurrent diarrhea Follow-up on pending CT abd/pelvis Appreciate Speech following Follow HFP, INR daily Consider EGD prior to discharge Acute viral hepatitis panel pending Blood cultures in process Further recommendations to  follow   Annitta Needs, PhD, ANP-BC [facility]     LOS: 1 day    07/26/2021, 9:42 AM

## 2021-07-26 NOTE — Progress Notes (Signed)
Patient pulled out Nasogastric Tube. Reinsertion was attempted 3 additional times. Attempts were unsuccessful. Dr.Adefeso notified.

## 2021-07-26 NOTE — Progress Notes (Addendum)
PROGRESS NOTE  Peter [name] TDD:[id number] DOB: [birth year] DOA: 07/25/2021 PCP: Redmond School, MD  Brief History:  88 y.o. male with medical history of COPD, tobacco abuse, dysphagia, peripheral arterial disease, coronary disease presenting with diarrhea, coughing, shortness of breath.  Patient was recently discharged from the hospital after a stay from 07/20/21 to 07/22/21 when he was treated for his diarrhea and aspiration pneumonia.  The patient was discharged home with Augmentin.  He states that he has taken about 4 doses of Augmentin since discharge from the hospital.  His C. difficile assay was negative at that time.  The patient states that he was feeling well at the time of discharge without any shortness of breath, and his diarrhea had subsided.  However, on 07/24/2021, he began having increasing shortness of breath and worsening diarrhea.  He states that he had up to 10 loose bowel movements without any blood or black tarry stool.  He has had some nausea but without any emesis.  He has had some "spit up" but denies any hematemesis.  However, he states that he has been spitting up some "dark stuff".  He denies any abdominal pain but has had some intermittent cramping.  In the early morning 07/25/2021, the patient was getting up to go to the bathroom when he had a near syncopal episode with increasing shortness of breath.  As result EMS was activated.   ED Patient was febrile to 101.9 F and tachycardic into the 140s.  He was hemodynamically stable.  Oxygen saturation 92% on room air.  BMP showed sodium 137, potassium 3.5, bicarbonate 17, serum creatinine 1.22.  AST 60, ALT 52, alk phosphatase 93, total bilirubin 0.8.  WBC 34.5, hemoglobin 15.4, platelets 422,000.  Lactic acid was 7.9 >>8.1.  Patient was given 2 and half liters of lactated Ringer's and started on levofloxacin initially.  Chest x-ray showed left-sided infiltrate.  EKG showed atrial flutter with heart rate 150s, nonspecific  ST changes.  COVID-19 was negative.    Assessment/Plan: Severe Sepsis -Due to pneumonia -Present on admission -Presented with fever, tachycardia, leukocytosis, elevated lactate -Started Zosyn and vancomycin -Continue IV fluids -Follow blood cultures -UA negative for pyuria -Check PCT  Acute Respiratory Failure with hypoxia -due to aspiration pneumonia, COPD exacerbation and fluid overload -now on 5L [place] -wean for saturation >90%   Aspiration pneumonia -Continue Zosyn -Evaluated by speech during his last admission>> dysphagia 3 diet with thin liquids  Fluid Overload -saline lock IVF -lasix IV x 1   Diarrhea/Abdominal Pain -Check C. Difficile--no BM in past 24 hours -Stool pathogen panel--no BM in past 24 hours -CT abd/pelvis  [place] Emesis -consult GI -start protonix drip -1.3L out from NG before pt pulled out  Transaminasemia -likely ischemic hepatitis due to hemodynamic changes -viral hepatitis panel -RUQ US--cholelithiasis without cholecystitis, fatty liver -GI consult   Lactic acidosis -Continue fluid resuscitation -Check ABG--7.319/26/68/15 RA -given >4L IVF   COPD exacerbation -Start duo nebs -Continue IV Solu-Medrol -Continue Pulmicort   Atrial flutter -IV lopressor -now back in sinue -Echo  Profound Leukocytosis -check diff -likely leukemoid reaction in setting of steroids -CT abd/pelvis -follow blood culture -UA--no pyuria -LDH -retic count   AKI -baseline creatinine 0.6-0.7 -presented with creatinine 1.22 -due to volume depletion and sepsis -improved with IVF  Urinary Retention -foley placed 1/16 AM   Coronary disease -No chest pain presently -EKG with atrial flutter nonspecific ST changes -Continue aspirin--holding due to hematemesis  Tobacco abuse -Tobacco cessation discussed   Essential hypertension -Holding lisinopril and amlodipine due to low blood pressure margin   Heme positive stool -Hgb stable -IV PPI  drip -currently not stable for any endoscopic intervention -GI consult    Goals of Care -discussed with pt and daughter -confirmed DNR       Status is: Inpatient  Remains inpatient appropriate because: severity of illness, respiratory failure, sepsis        Family Communication:   daughter updated at bedside 1/16  Consultants:  GI  Code Status:  DNR  DVT Prophylaxis:  SCDs   Procedures: As Listed in Progress Note Above  Antibiotics: Zosyn 1/15>> Vanc 1/15>>1/16     The patient is critically ill with multiple organ systems failure and requires high complexity decision making for assessment and support, frequent evaluation and titration of therapies, application of advanced monitoring technologies and extensive interpretation of multiple databases.  Critical care time - 45 mins.     Subjective: Pt has sob.  Complains of lower abd pain.  Denies f/c, cp,  Had hematemesis yesterday  Objective: Vitals:   07/26/21 0234 07/26/21 0700 07/26/21 0723 07/26/21 0800  BP:  (!) 160/59  (!) 168/59  Pulse:  89 94 99  Resp:  (!) 37 (!) 31 (!) 31  Temp:      TempSrc:      SpO2: 99% 97% 97% 96%  Weight:      Height:        Intake/Output Summary (Last 24 hours) at 07/26/2021 0805 Last data filed at 07/26/2021 0700 Gross per 24 hour  Intake 3452.51 ml  Output 1350 ml  Net 2102.51 ml   Weight change: 2.438 kg Exam:  General:  Pt is alert, follows commands appropriately, not in acute distress HEENT: No icterus, No thrush, No neck mass, [place]/AT Cardiovascular: RRR, S1/S2, no rubs, no gallops Respiratory: bilateral rhonchi.   Abdomen: Soft/+BS, LLQ, RLQ tender, non distended, no guarding Extremities: No edema, No lymphangitis, No petechiae, No rashes, no synovitis   Data Reviewed: I have personally reviewed following labs and imaging studies Basic Metabolic Panel: Recent Labs  Lab 07/20/21 1047 07/21/21 0436 07/22/21 0839 07/25/21 0505 07/26/21 0510  NA  130* 132* 132* 137 140  K 4.2 3.7 3.2* 3.5 3.8  CL 98 102 103 105 112*  CO2 '24 23 22 ' 17* 19*  GLUCOSE 122* 111* 106* 196* 105*  BUN '12 11 8 15 20  ' CREATININE 0.68 0.67 0.62 1.22 0.79  CALCIUM 9.1 9.1 8.7* 10.1 9.4   Liver Function Tests: Recent Labs  Lab 07/20/21 1047 07/25/21 0505 07/26/21 0510  AST 20 60* 2,195*  ALT 24 52* 2,041*  ALKPHOS 97 93 130*  BILITOT 0.3 0.8 1.2  PROT 6.8 7.0 5.5*  ALBUMIN 3.0* 3.1* 2.5*   Recent Labs  Lab 07/20/21 1047  LIPASE 26   No results for input(s): AMMONIA in the last 168 hours. Coagulation Profile: Recent Labs  Lab 07/25/21 0505  INR 1.2   CBC: Recent Labs  Lab 07/20/21 1047 07/21/21 0436 07/22/21 0839 07/25/21 0505 07/26/21 0510  WBC 22.6* 18.0* 14.6* 34.5* 75.2*  NEUTROABS 17.5*  --   --  32.1*  --   HGB 12.6* 11.5* 12.1* 15.4 12.2*  HCT 36.4* 34.4* 35.5* 46.0 36.6*  MCV 95.3 94.8 94.2 98.5 96.8  PLT 322 296 292 422* 349   Cardiac Enzymes: No results for input(s): CKTOTAL, CKMB, CKMBINDEX, TROPONINI in the last 168 hours. BNP: Invalid input(s): POCBNP CBG:  Recent Labs  Lab 07/20/21 2132 07/21/21 0449  GLUCAP 115* 116*   HbA1C: No results for input(s): HGBA1C in the last 72 hours. Urine analysis:    Component Value Date/Time   COLORURINE AMBER (A) 07/25/2021 0531   APPEARANCEUR CLEAR 07/25/2021 0531   LABSPEC 1.025 07/25/2021 0531   PHURINE 6.0 07/25/2021 0531   GLUCOSEU NEGATIVE 07/25/2021 0531   HGBUR TRACE (A) 07/25/2021 0531   BILIRUBINUR NEGATIVE 07/25/2021 0531   KETONESUR NEGATIVE 07/25/2021 0531   PROTEINUR 30 (A) 07/25/2021 0531   NITRITE NEGATIVE 07/25/2021 0531   LEUKOCYTESUR NEGATIVE 07/25/2021 0531   Sepsis Labs: '@LABRCNTIP' (procalcitonin:4,lacticidven:4) ) Recent Results (from the past 240 hour(s))  Resp Panel by RT-PCR (Flu A&B, Covid) Nasopharyngeal Swab     Status: None   Collection Time: 07/20/21 11:10 AM   Specimen: Nasopharyngeal Swab; Nasopharyngeal(NP) swabs in vial transport  medium  Result Value Ref Range Status   SARS Coronavirus 2 by RT PCR NEGATIVE NEGATIVE Final    Comment: (NOTE) SARS-CoV-2 target nucleic acids are NOT DETECTED.  The SARS-CoV-2 RNA is generally detectable in upper respiratory specimens during the acute phase of infection. The lowest concentration of SARS-CoV-2 viral copies this assay can detect is 138 copies/mL. A negative result does not preclude SARS-Cov-2 infection and should not be used as the sole basis for treatment or other patient management decisions. A negative result may occur with  improper specimen collection/handling, submission of specimen other than nasopharyngeal swab, presence of viral mutation(s) within the areas targeted by this assay, and inadequate number of viral copies(<138 copies/mL). A negative result must be combined with clinical observations, patient history, and epidemiological information. The expected result is Negative.  Fact Sheet for Patients:  EntrepreneurPulse.com.au  Fact Sheet for Healthcare Providers:  IncredibleEmployment.be  This test is no t yet approved or cleared by the [place] FDA and  has been authorized for detection and/or diagnosis of SARS-CoV-2 by FDA under an Emergency Use Authorization (EUA). This EUA will remain  in effect (meaning this test can be used) for the duration of the COVID-19 declaration under Section 564(b)(1) of the Act, 21 U.S.C.section 360bbb-3(b)(1), unless the authorization is terminated  or revoked sooner.       Influenza A by PCR NEGATIVE NEGATIVE Final   Influenza B by PCR NEGATIVE NEGATIVE Final    Comment: (NOTE) The Xpert Xpress SARS-CoV-2/FLU/RSV plus assay is intended as an aid in the diagnosis of influenza from Nasopharyngeal swab specimens and should not be used as a sole basis for treatment. Nasal washings and aspirates are unacceptable for Xpert Xpress SARS-CoV-2/FLU/RSV testing.  Fact Sheet for  Patients: EntrepreneurPulse.com.au  Fact Sheet for Healthcare Providers: IncredibleEmployment.be  This test is not yet approved or cleared by the [place] FDA and has been authorized for detection and/or diagnosis of SARS-CoV-2 by FDA under an Emergency Use Authorization (EUA). This EUA will remain in effect (meaning this test can be used) for the duration of the COVID-19 declaration under Section 564(b)(1) of the Act, 21 U.S.C. section 360bbb-3(b)(1), unless the authorization is terminated or revoked.  Performed at [facility], [address], [place],  [address]   Gastrointestinal Panel by PCR , Stool     Status: Abnormal   Collection Time: 07/21/21  8:00 AM   Specimen: Stool  Result Value Ref Range Status   Campylobacter species NOT DETECTED NOT DETECTED Final   Plesimonas shigelloides NOT DETECTED NOT DETECTED Final   Salmonella species NOT DETECTED NOT DETECTED Final   Yersinia  enterocolitica NOT DETECTED NOT DETECTED Final   Vibrio species NOT DETECTED NOT DETECTED Final   Vibrio cholerae NOT DETECTED NOT DETECTED Final   Enteroaggregative E coli (EAEC) NOT DETECTED NOT DETECTED Final   Enteropathogenic E coli (EPEC) NOT DETECTED NOT DETECTED Final   Enterotoxigenic E coli (ETEC) NOT DETECTED NOT DETECTED Final   Shiga like toxin producing E coli (STEC) NOT DETECTED NOT DETECTED Final   Shigella/Enteroinvasive E coli (EIEC) NOT DETECTED NOT DETECTED Final   Cryptosporidium NOT DETECTED NOT DETECTED Final   Cyclospora cayetanensis NOT DETECTED NOT DETECTED Final   Entamoeba histolytica NOT DETECTED NOT DETECTED Final   Giardia lamblia NOT DETECTED NOT DETECTED Final   Adenovirus F40/41 NOT DETECTED NOT DETECTED Final   Astrovirus NOT DETECTED NOT DETECTED Final   Norovirus GI/GII DETECTED (A) NOT DETECTED Final    Comment: RESULT CALLED TO, READ BACK BY AND VERIFIED WITHMarline Backbone RN [phone] 07/22/21 HNM    Rotavirus A NOT  DETECTED NOT DETECTED Final   Sapovirus (I, II, IV, and V) NOT DETECTED NOT DETECTED Final    Comment: Performed at [facility], [address] [place], [place] [address]  C Difficile Quick Screen w PCR reflex     Status: None   Collection Time: 07/21/21  9:44 AM   Specimen: STOOL  Result Value Ref Range Status   C Diff antigen NEGATIVE NEGATIVE Final   C Diff toxin NEGATIVE NEGATIVE Final   C Diff interpretation No C. difficile detected.  Final    Comment: Performed at [facility], [address] [place], [place] [address]  Resp Panel by RT-PCR (Flu A&B, Covid) Nasopharyngeal Swab     Status: None   Collection Time: 07/25/21  5:05 AM   Specimen: Nasopharyngeal Swab; Nasopharyngeal(NP) swabs in vial transport medium  Result Value Ref Range Status   SARS Coronavirus 2 by RT PCR NEGATIVE NEGATIVE Final    Comment: (NOTE) SARS-CoV-2 target nucleic acids are NOT DETECTED.  The SARS-CoV-2 RNA is generally detectable in upper respiratory specimens during the acute phase of infection. The lowest concentration of SARS-CoV-2 viral copies this assay can detect is 138 copies/mL. A negative result does not preclude SARS-Cov-2 infection and should not be used as the sole basis for treatment or other patient management decisions. A negative result may occur with  improper specimen collection/handling, submission of specimen other than nasopharyngeal swab, presence of viral mutation(s) within the areas targeted by this assay, and inadequate number of viral copies(<138 copies/mL). A negative result must be combined with clinical observations, patient history, and epidemiological information. The expected result is Negative.  Fact Sheet for Patients:  EntrepreneurPulse.com.au  Fact Sheet for Healthcare Providers:  IncredibleEmployment.be  This test is no t yet approved or cleared by the [place] FDA and  has been authorized for detection  and/or diagnosis of SARS-CoV-2 by FDA under an Emergency Use Authorization (EUA). This EUA will remain  in effect (meaning this test can be used) for the duration of the COVID-19 declaration under Section 564(b)(1) of the Act, 21 U.S.C.section 360bbb-3(b)(1), unless the authorization is terminated  or revoked sooner.       Influenza A by PCR NEGATIVE NEGATIVE Final   Influenza B by PCR NEGATIVE NEGATIVE Final    Comment: (NOTE) The Xpert Xpress SARS-CoV-2/FLU/RSV plus assay is intended as an aid in the diagnosis of influenza from Nasopharyngeal swab specimens and should not be used as a sole basis for treatment. Nasal washings and aspirates are  unacceptable for Xpert Xpress SARS-CoV-2/FLU/RSV testing.  Fact Sheet for Patients: EntrepreneurPulse.com.au  Fact Sheet for Healthcare Providers: IncredibleEmployment.be  This test is not yet approved or cleared by the [place] FDA and has been authorized for detection and/or diagnosis of SARS-CoV-2 by FDA under an Emergency Use Authorization (EUA). This EUA will remain in effect (meaning this test can be used) for the duration of the COVID-19 declaration under Section 564(b)(1) of the Act, 21 U.S.C. section 360bbb-3(b)(1), unless the authorization is terminated or revoked.  Performed at [facility], [address] [place], [place] [address]   Blood Culture (routine x 2)     Status: None (Preliminary result)   Collection Time: 07/25/21  5:05 AM   Specimen: Left Antecubital; Blood  Result Value Ref Range Status   Specimen Description LEFT ANTECUBITAL  Final   Special Requests   Final    BOTTLES DRAWN AEROBIC AND ANAEROBIC Blood Culture results may not be optimal due to an excessive volume of blood received in culture bottles   Culture   Final    NO GROWTH < 12 HOURS Performed at [facility], [address] [place], [place] [address]    Report Status PENDING  Incomplete  Blood Culture  (routine x 2)     Status: None (Preliminary result)   Collection Time: 07/25/21  5:05 AM   Specimen: Right Antecubital; Blood  Result Value Ref Range Status   Specimen Description RIGHT ANTECUBITAL  Final   Special Requests   Final    BOTTLES DRAWN AEROBIC AND ANAEROBIC Blood Culture adequate volume   Culture   Final    NO GROWTH < 12 HOURS Performed at [facility], [address], [place], [place] [address]    Report Status PENDING  Incomplete  MRSA Next Gen by PCR, Nasal     Status: None   Collection Time: 07/25/21  8:54 AM   Specimen: Nasal Mucosa; Nasal Swab  Result Value Ref Range Status   MRSA by PCR Next Gen NOT DETECTED NOT DETECTED Final    Comment: (NOTE) The GeneXpert MRSA Assay (FDA approved for NASAL specimens only), is one component of a comprehensive MRSA colonization surveillance program. It is not intended to diagnose MRSA infection nor to guide or monitor treatment for MRSA infections. Test performance is not FDA approved in patients less than 23 years old. Performed at [facility], [address] [place], [place] [address]      Scheduled Meds:  aspirin EC  81 mg Oral Q breakfast   budesonide (PULMICORT) nebulizer solution  0.5 mg Nebulization BID   chlorhexidine  15 mL Mouth Rinse BID   Chlorhexidine Gluconate Cloth  6 each Topical Q0600   enoxaparin (LOVENOX) injection  40 mg Subcutaneous Q24H   finasteride  5 mg Oral QPC lunch   furosemide  40 mg Intravenous Once   ipratropium-albuterol  3 mL Nebulization Q6H   mouth rinse  15 mL Mouth Rinse q12n4p   methylPREDNISolone (SOLU-MEDROL) injection  60 mg Intravenous Q12H   nicotine  21 mg Transdermal Daily   pantoprazole (PROTONIX) IV  40 mg Intravenous Q12H   Continuous Infusions:  piperacillin-tazobactam (ZOSYN)  IV 3.375 g (07/26/21 0651)   vancomycin      Procedures/Studies: CT CHEST ABDOMEN PELVIS W CONTRAST  Result Date: 07/20/2021 CLINICAL DATA:  Cough, shortness of breath, diarrhea. Nodular  opacity seen on prior chest x-ray EXAM: CT CHEST, ABDOMEN, AND PELVIS WITH CONTRAST TECHNIQUE: Multidetector CT imaging of the chest, abdomen and pelvis  was performed following the standard protocol during bolus administration of intravenous contrast. RADIATION DOSE REDUCTION: This exam was performed according to the departmental dose-optimization program which includes automated exposure control, adjustment of the mA and/or kV according to patient size and/or use of iterative reconstruction technique. CONTRAST:  155m OMNIPAQUE IOHEXOL 300 MG/ML  SOLN COMPARISON:  Same-day chest radiograph, lumbar spine CT 09/21/2012 FINDINGS: CT CHEST FINDINGS Cardiovascular: The heart size is normal. There is no pericardial effusion. Aortic valve, mitral annular, and coronary artery calcifications are noted. There is extensive calcified atherosclerotic plaque throughout the thoracic aorta. Mediastinum/Nodes: The thyroid is unremarkable. The esophagus is grossly unremarkable. There is no mediastinal, hilar, or axillary lymphadenopathy. Lungs/Pleura: The trachea and central airways are patent. There is debris in the distal trachea and left main bronchus and smaller distal airways. There is central bronchial wall thickening with areas of mucoid impaction. There is no focal consolidation or pulmonary edema. There is no pleural effusion or pneumothorax. Minimal linear opacities in the left base likely reflect subsegmental atelectasis. There are no suspicious nodules. The finding on the prior chest radiograph was likely artifactual. Musculoskeletal: There is no acute osseous abnormality or aggressive osseous lesion. There is multilevel degenerative change of the thoracic spine with flowing anterior osteophytes across the lower thoracic/upper lumbar vertebral bodies consistent with diffuse idiopathic skeletal hyperostosis. CT ABDOMEN PELVIS FINDINGS Hepatobiliary: The liver is unremarkable. No focal lesions are seen. The gallbladder is  distended but otherwise unremarkable, without evidence of acute cholecystitis. There is no biliary ductal dilatation. Pancreas: A subcentimeter cystic lesion in the pancreatic tail may reflect an intraductal papillary mucinous neoplasm (2-62). The pancreas is otherwise unremarkable. There are no other focal lesions. There is no main pancreatic ductal dilatation or peripancreatic inflammatory change. Spleen: Unremarkable. Adrenals/Urinary Tract: Adrenals are unremarkable. Multiple renal cysts are noted bilaterally, with 1 on the left demonstrating a thin internal septation. There are no solid or suspicious lesions. Calcifications in the kidneys are favored to reflect vascular calcifications as opposed to renal stones. There is bilateral perinephric stranding, nonspecific. There is no hydronephrosis or hydroureter. The bladder is distended. There is a small right posterior bladder diverticulum likely reflecting chronic outlet obstruction (2-107). Stomach/Bowel: There is a small hiatal hernia. The stomach is otherwise unremarkable, allowing for the degree of decompression. There is no evidence of bowel obstruction. There is extensive sigmoid diverticulosis without evidence of acute diverticulitis. A loop of small bowel is herniated into the right inguinal canal without evidence of obstruction or strangulation. The appendix is normal. Vascular/Lymphatic: There are extensive vascular calcifications throughout the abdomen and pelvis likely resulting in high-grade stenoses of the celiac artery, SMA, bilateral renal arteries, and bilateral iliofemoral vessels. The main portal and splenic veins are patent. There is no abdominal or pelvic lymphadenopathy. Reproductive: Prostate is enlarged measuring up to 5.8 cm transverse. The prostate impresses upon the inferior aspect of the bladder. Other: There is no ascites or free air. Is a bowel containing right inguinal hernia, as above. Musculoskeletal: There is no acute osseous  abnormality or aggressive osseous lesion. There are multilevel degenerative changes throughout the lumbar spine. IMPRESSION: 1. Debris in the trachea and airways with associated bronchial wall thickening suspicious for aspiration. 2. Otherwise, clear lungs with no focal consolidation or suspicious nodule. The finding on the prior radiograph was likely artifactual. 3. No acute findings in the abdomen or pelvis. 4. Right inguinal hernia containing a loop of small bowel without evidence of obstruction or incarceration. 5. Enlarged prostate which impresses  upon the inferior aspect of the bladder. A small right posterior bladder diverticulum likely reflects chronic outlet obstruction. 6. Diverticulosis without evidence of acute diverticulitis. 7. Subcentimeter cystic lesion in the pancreatic tail may reflect an intraductal papillary mucinous neoplasm. 8. Multilevel degenerative change of the spine with diffuse idiopathic skeletal hyperostosis. 9. Extensive vascular calcifications throughout the thoracoabdominal aorta and major branch vessels likely resulting in high-grade stenoses throughout the abdominal/pelvic vasculature. 10. Aortic valve, mitral annular, and coronary artery calcifications. Aortic Atherosclerosis (ICD10-I70.0). Electronically Signed   By: Valetta Mole M.D.   On: 07/20/2021 15:26   DG CHEST PORT 1 VIEW  Result Date: 07/25/2021 CLINICAL DATA:  Check NG placement EXAM: PORTABLE CHEST 1 VIEW COMPARISON:  Film from earlier in the same day. FINDINGS: Cardiac shadow is within normal limits. Aortic calcifications are again seen. Patchy infiltrate is noted in the left lung although improved compared with exam. Gastric catheter is noted coiled within the oropharynx. This should be withdrawn completely and readvanced. IMPRESSION: Gastric catheter coiled within the oropharynx. This should be withdrawn and readvanced. Improving aeration in the left lung. Electronically Signed   By: Inez Catalina M.D.   On:  07/25/2021 20:35   DG Chest Port 1 View  Result Date: 07/25/2021 CLINICAL DATA:  Questionable sepsis EXAM: PORTABLE CHEST 1 VIEW COMPARISON:  Five days ago FINDINGS: New airspace disease on the left. Clear right lung. Normal heart size and mediastinal contours. Extensive artifact from EKG leads. IMPRESSION: New, extensive pneumonia on the left. Electronically Signed   By: Jorje Guild M.D.   On: 07/25/2021 05:27   DG Chest Portable 1 View  Result Date: 07/20/2021 CLINICAL DATA:  Cough, shortness of breath EXAM: PORTABLE CHEST 1 VIEW COMPARISON:  Chest radiograph 10/20/2019 FINDINGS: The cardiomediastinal silhouette is normal There is no focal consolidation or pulmonary edema. There is a 1.2 cm nodular opacity projecting over the right lower lobe not seen on the prior study. The right costophrenic angle is partially excluded, but there is no significant right pleural effusion. There is no left effusion. There is no pneumothorax. There is no acute osseous abnormality. IMPRESSION: 1. 1.2 cm nodular opacity projecting over the right lower lobe. Recommend nonemergent CT chest for further evaluation. 2. No radiographic evidence of acute cardiopulmonary process. Electronically Signed   By: Valetta Mole M.D.   On: 07/20/2021 10:50   DG Chest Port 1V same Day  Result Date: 07/25/2021 CLINICAL DATA:  Enteric catheter repositioning, emesis EXAM: PORTABLE CHEST 1 VIEW COMPARISON:  07/25/2021 FINDINGS: Frontal view of the chest demonstrates 3 position of the enteric catheter, tip and side port projecting over the distal thoracic esophagus. Recommend advancing at least 6 cm to ensure side port placement within the gastric lumen. The cardiac silhouette is stable. Diffuse interstitial prominence, with stable left basilar airspace disease. No effusion or pneumothorax. No acute bony abnormality. IMPRESSION: 1. Enteric catheter tip and side port projecting over the distal thoracic esophagus. Recommend advancing 6 cm. 2.  Stable left basilar airspace disease. Electronically Signed   By: Randa Ngo M.D.   On: 07/25/2021 20:51   ECHOCARDIOGRAM COMPLETE  Result Date: 07/25/2021    ECHOCARDIOGRAM REPORT   Patient Name:   Peter [name] Date of Exam: 07/25/2021 Medical Rec #:  [id number]      Height:       70.0 in Accession #:    [id number]     Weight:       160.3 lb Date of Birth:  02-05-1933  BSA:          1.900 m Patient Age:    43 years       BP:           135/76 mmHg Patient Gender: M              HR:           101 bpm. Exam Location:  [facility] Procedure: 2D Echo, Cardiac Doppler and Color Doppler Indications:    I48.92* Unspecified atrial flutter  History:        Patient has prior history of Echocardiogram examinations, most                 recent 03/08/2017. Abnormal ECG, COPD, Arrythmias:Atrial Flutter,                 Signs/Symptoms:Dyspnea and Shortness of Breath; Risk                 Factors:Hypertension, Dyslipidemia and Current Smoker.  Sonographer:    Roseanna Rainbow RDCS Referring Phys: [phone] Mariangel Ringley  Sonographer Comments: Technically difficult study due to poor echo windows. Image acquisition challenging due to respiratory motion. IMPRESSIONS  1. Left ventricular ejection fraction, by estimation, is 60 to 65%. The left ventricle has normal function. The left ventricle has no regional wall motion abnormalities. Left ventricular diastolic parameters are consistent with Grade I diastolic dysfunction (impaired relaxation).  2. Right ventricular systolic function is normal. The right ventricular size is normal.  3. Interatrial septal lipoma.  4. The mitral valve is grossly normal. No evidence of mitral valve regurgitation.  5. Focal calcification. The aortic valve was not well visualized. Aortic valve regurgitation is not visualized.  6. The inferior vena cava is dilated in size with >50% respiratory variability, suggesting right atrial pressure of 8 mmHg. FINDINGS  Left Ventricle: Left ventricular ejection fraction, by  estimation, is 60 to 65%. The left ventricle has normal function. The left ventricle has no regional wall motion abnormalities. The left ventricular internal cavity size was normal in size. There is  no left ventricular hypertrophy. Left ventricular diastolic parameters are consistent with Grade I diastolic dysfunction (impaired relaxation). Right Ventricle: The right ventricular size is normal. No increase in right ventricular wall thickness. Right ventricular systolic function is normal. Left Atrium: Left atrial size was normal in size. Right Atrium: Right atrial size was normal in size. Pericardium: There is no evidence of pericardial effusion. Mitral Valve: The mitral valve is grossly normal. There is mild calcification of the mitral valve leaflet(s). No evidence of mitral valve regurgitation. Tricuspid Valve: The tricuspid valve is not well visualized. Tricuspid valve regurgitation is not demonstrated. Aortic Valve: Focal calcification. The aortic valve was not well visualized. Aortic valve regurgitation is not visualized. Pulmonic Valve: The pulmonic valve was not well visualized. Pulmonic valve regurgitation is not visualized. Aorta: The aortic root and ascending aorta are structurally normal, with no evidence of dilitation. Venous: The inferior vena cava is dilated in size with greater than 50% respiratory variability, suggesting right atrial pressure of 8 mmHg. IAS/Shunts: No atrial level shunt detected by color flow Doppler.  LEFT VENTRICLE PLAX 2D LVIDd:         4.10 cm     Diastology LVIDs:         2.90 cm     LV e' medial:    5.22 cm/s LV PW:         1.00 cm     LV E/e'  medial:  16.9 LV IVS:        1.10 cm     LV e' lateral:   6.09 cm/s LVOT diam:     2.30 cm     LV E/e' lateral: 14.5 LV SV:         95 LV SV Index:   50 LVOT Area:     4.15 cm  LV Volumes (MOD) LV vol d, MOD A2C: 59.4 ml LV vol d, MOD A4C: 87.2 ml LV vol s, MOD A2C: 23.1 ml LV vol s, MOD A4C: 30.3 ml LV SV MOD A2C:     36.3 ml LV SV MOD  A4C:     87.2 ml LV SV MOD BP:      45.8 ml RIGHT VENTRICLE             IVC RV S prime:     16.60 cm/s  IVC diam: 2.40 cm TAPSE (M-mode): 2.7 cm LEFT ATRIUM           Index        RIGHT ATRIUM           Index LA diam:      3.50 cm 1.84 cm/m   RA Area:     14.30 cm LA Vol (A2C): 26.6 ml 14.00 ml/m  RA Volume:   33.30 ml  17.53 ml/m LA Vol (A4C): 36.6 ml 19.26 ml/m  AORTIC VALVE LVOT Vmax:   114.00 cm/s LVOT Vmean:  72.900 cm/s LVOT VTI:    0.227 m  AORTA Ao Root diam: 3.60 cm Ao Asc diam:  3.50 cm MITRAL VALVE MV Area (PHT): 3.82 cm     SHUNTS MV Decel Time: 198 msec     Systemic VTI:  0.23 m MV E velocity: 88.10 cm/s   Systemic Diam: 2.30 cm MV A velocity: 129.00 cm/s MV E/A ratio:  0.68 Mary Scientist, physiological signed by Phineas Inches Signature Date/Time: 07/25/2021/4:49:27 PM    Final    US Abdomen Limited RUQ (LIVER/GB)  Result Date: 07/25/2021 CLINICAL DATA:  Abdominal pain EXAM: ULTRASOUND ABDOMEN LIMITED RIGHT UPPER QUADRANT COMPARISON:  12/20/2016 FINDINGS: Gallbladder: Gallbladder stones are seen. There is no abnormal wall thickening. Technologist did not observe any tenderness over the gallbladder. There is no fluid around the gallbladder. Common bile duct: Diameter: 2.4 mm Liver: There is increased echogenicity. No focal abnormality is seen. Portal vein is patent on color Doppler imaging with normal direction of blood flow towards the liver. Other: None. IMPRESSION: Gallbladder stones. There are no signs of acute cholecystitis. Fatty liver. Electronically Signed   By: Elmer Picker M.D.   On: 07/25/2021 10:22    Orson Eva, DO  [facility]  If 7PM-7AM, please contact night-coverage [email or web address] Password [email or web address] 07/26/2021, 8:05 AM   LOS: 1 day

## 2021-07-26 NOTE — Consult Note (Signed)
Consultation Note Date: 07/26/2021   Patient Name: Peter [name]  DOB: 06-04-1933  MRN: [id number]  Age / Sex: 88 y.o., male  PCP: Redmond School, MD Referring Physician: Orson Eva, MD  Reason for Consultation: Establishing goals of care  HPI/Patient Profile: 88 y.o. male  with past medical history of COPD, tobacco abuse, dysphagia, PAD SP bypass to the extremities, CAD, recent hospital stay from 1/10 through 1/12 when he was treated for diarrhea and aspiration pneumonia, up to 10 loose bowel movements without any blood or black/tarry stool, spitting up coffee-ground emesis, BPH, anxiety/depression, GERD, small hiatal hernia, HTN admitted on 07/25/2021 with severe sepsis due to pneumonia present on admission, CT with high-grade stenosis versus complete opacification of SMA and celiac arteries, surgery consulted.   Clinical Assessment and Goals of Care: I have reviewed medical records including EPIC notes, labs and imaging, received report from RN, assessed the patient.  Mr. Rudin is lying quietly in bed, resting easily.  His sister, Bonnita Nasuti, is at bedside, but no other family.  As Bonnita Nasuti and I are speaking, Mr. demarko zeimet.  He is alert and oriented, able to make his needs known although he is hard of hearing.  I ask what has happened, and he talks about his lungs and pneumonia.  He does not mention bowel ischemia.  I share that I will reach out to his daughter.    I return later to speak with daughter, Jacqulyn Cane, to discuss diagnosis prognosis, GOC, EOL wishes, disposition and options.  I introduced Palliative Medicine as specialized medical care for people living with serious illness. It focuses on providing relief from the symptoms and stress of a serious illness. The goal is to improve quality of life for both the patient and the family.  We discussed a brief life review of the patient.  Mr. Tweed wife  died in residential hospice at [facility] shares her feelings that her mother suffered.  She states that she would not except to residential hospice again.    We then focused on their current illness.  Family states they understand that Mr. Zuver is not a candidate for surgery.  They share that his desire is to go home and be cared for under hospice care at home.  We talked about the physical, mental, spiritual difficulties in caring for someone at home under hospice care.  Mr. Tomasik previously had 24/7 care at home, but [name] says that some of his workers feel that they cannot care for him in his current state.    Family states they would need a hospital bed.  They would like to keep the Foley catheter.  We talked about what is and is not provided with at home hospice.  We talked about how frequently hospice workers are available to provide care.  The natural disease trajectory and expectations at EOL were discussed.  In relation to advanced directives, concepts specific to code status, Mr. Bonano is DNR.  Hospice and Palliative Care services outpatient were explained and offered.  The Dunsworth family is requesting at home hospice care with hospice of [facility]  We briefly discussed residential hospice.  [name] states that she would never accept residential hospice again, she feels that her mother suffered.  Discussed the importance of continued conversation with family and the medical providers regarding overall plan of care and treatment options, ensuring decisions are within the context of the patients values and GOCs.  Questions and concerns were addressed.   The family was encouraged to call with questions or concerns.  PMT will continue to support holistically.  Conference with attending, bedside nursing staff, transition of care team related to patient condition, needs, goals of care, disposition.   HCPOA  NEXT OF KIN -daughter, Jacqulyn Cane.  Sister, Bonnita Nasuti is at  bedside    SUMMARY OF RECOMMENDATIONS   At this point requesting to go home with hospice of [facility] Would need a hospital bed Would need 24/7 care  Code Status/Advance Care Planning: DNR  Symptom Management:  Per hospitalist, no additional needs at this time.  Palliative Prophylaxis:  Frequent Pain Assessment, Oral Care, and Turn Reposition  Additional Recommendations (Limitations, Scope, Preferences): Requesting home with hospice to let nature take its course  -   Psycho-social/Spiritual:  Desire for further Chaplaincy support:no Additional Recommendations: Caregiving  Support/Resources and Education on Hospice  Prognosis:  Unable to determine, guarded at this point.  Days to week would not be surprising based on acuity of condition, not a candidate for surgery.  Discharge Planning:  To be determined, based on outcomes and family choice.       Primary Diagnoses: Present on Admission:  Sepsis due to undetermined organism [facility])  PAD (peripheral artery disease) ([place])   I have reviewed the medical record, interviewed the patient and family, and examined the patient. The following aspects are pertinent.  Past Medical History:  Diagnosis Date   Anxiety    Arthritis    BPH (benign prostatic hyperplasia)    Chronic pain    Complication of anesthesia    had an anxiety attack once   COPD (chronic obstructive pulmonary disease) (HCC)    Depression    GERD (gastroesophageal reflux disease)    Hiatal hernia    small   History of nuclear stress test    Nuc 5/19: soft tissue attenuation; no ischemia or scar, EF 72; Low Risk   HTN (hypertension)    Internal carotid artery stenosis, left    Pneumonia    Prostate disease    h/o elevated PSA, neg bx, [facility]   Sciatica    Wears glasses    Wears partial dentures    Social History   Socioeconomic History   Marital status: Widowed    Spouse name: Not on file   Number of children: 1   Years of  education: Not on file   Highest education level: Not on file  Occupational History   Occupation: retired    Comment: Orthoptist  Tobacco Use   Smoking status: Every Day    Packs/day: 1.00    Years: 60.00    Pack years: 60.00    Types: Cigarettes   Smokeless tobacco: Never  Vaping Use   Vaping Use: Never used  Substance and Sexual Activity   Alcohol use: No    Comment: quit 10  years ago   Drug use: No   Sexual activity: Not Currently    Partners: Female  Other Topics Concern   Not on file  Social History Narrative  Pt reports that he has aides 24 hours a day,    Social Determinants of Health   Financial Resource Strain: Not on file  Food Insecurity: Not on file  Transportation Needs: Not on file  Physical Activity: Not on file  Stress: Not on file  Social Connections: Not on file   Family History  Problem Relation Age of Onset   Stroke Father 67       deceased   Heart attack Mother    Colon cancer Neg Hx    Scheduled Meds:  budesonide (PULMICORT) nebulizer solution  0.5 mg Nebulization BID   chlorhexidine  15 mL Mouth Rinse BID   Chlorhexidine Gluconate Cloth  6 each Topical Q0600   finasteride  5 mg Oral QPC lunch   ipratropium-albuterol  3 mL Nebulization Q6H   mouth rinse  15 mL Mouth Rinse q12n4p   methylPREDNISolone (SOLU-MEDROL) injection  60 mg Intravenous Q12H   nicotine  21 mg Transdermal Daily   [START ON 07/29/2021] pantoprazole  40 mg Intravenous Q12H   Continuous Infusions:  heparin 1,150 Units/hr (07/26/21 1509)   pantoprazole Stopped (07/26/21 0941)   piperacillin-tazobactam (ZOSYN)  IV 12.5 mL/hr at 07/26/21 1509   PRN Meds:.acetaminophen **OR** acetaminophen, ALPRAZolam, labetalol, ondansetron **OR** ondansetron (ZOFRAN) IV Medications Prior to Admission:  Prior to Admission medications   Medication Sig Start Date End Date Taking? Authorizing Provider  acetaminophen (TYLENOL) 325 MG tablet Take 2 tablets (650 mg total) by mouth every 6 (six)  hours as needed for mild pain, fever or headache (or Fever >/= 101). 07/22/21  Yes Emokpae, Courage, MD  albuterol (VENTOLIN HFA) 108 (90 Base) MCG/ACT inhaler Inhale 1-2 puffs into the lungs every 6 (six) hours as needed for wheezing or shortness of breath. 07/22/21  Yes Emokpae, Courage, MD  ALPRAZolam Duanne Moron) 0.5 MG tablet Take 0.5 mg by mouth 2 (two) times daily as needed for anxiety.   Yes [provider]  amLODipine (NORVASC) 5 MG tablet Take 5 mg by mouth daily.  11/28/16  Yes [provider]  amoxicillin-clavulanate (AUGMENTIN) 875-125 MG tablet Take 1 tablet by mouth 2 (two) times daily for 5 days. 07/22/21 07/27/21 Yes Roxan Hockey, MD  aspirin EC 81 MG EC tablet Take 1 tablet (81 mg total) by mouth daily with breakfast. 07/22/21  Yes Emokpae, Courage, MD  finasteride (PROSCAR) 5 MG tablet Take 1 tablet (5 mg total) by mouth daily after lunch. 07/22/21  Yes Emokpae, Courage, MD  lisinopril (ZESTRIL) 40 MG tablet Take 1 tablet (40 mg total) by mouth daily. 07/22/21  Yes Emokpae, Courage, MD  mometasone-formoterol (DULERA) 200-5 MCG/ACT AERO Inhale 2 puffs into the lungs 2 (two) times daily. 07/22/21  Yes Emokpae, Courage, MD  pantoprazole (PROTONIX) 40 MG tablet Take 1 tablet (40 mg total) by mouth daily. TAKE (1) TABLET BY MOUTH ONCE DAILY BEFORE BREAKFAST. Strength: 40 mg 07/22/21  Yes Emokpae, Courage, MD  Potassium Chloride ER 20 MEQ TBCR Take 20 mEq by mouth daily. 1 tab daily by mouth 07/22/21  Yes Emokpae, Courage, MD  ondansetron (ZOFRAN) 4 MG tablet Take 1 tablet (4 mg total) by mouth every 6 (six) hours as needed for nausea. Patient not taking: Reported on 07/25/2021 07/22/21   Roxan Hockey, MD  polyethylene glycol (MIRALAX / GLYCOLAX) 17 g packet Take 17 g by mouth daily as needed for mild constipation. Patient not taking: Reported on 07/25/2021 07/22/21   Roxan Hockey, MD   No Known Allergies Review of Systems  Unable to  perform ROS: Acuity of condition    Physical Exam Vitals and nursing note reviewed.  Constitutional:      General: He is not in acute distress.    Appearance: He is ill-appearing.  Cardiovascular:     Rate and Rhythm: Normal rate.  Pulmonary:     Effort: Pulmonary effort is normal. No respiratory distress.  Abdominal:     Palpations: Abdomen is soft.     Tenderness: There is abdominal tenderness.  Skin:    General: Skin is warm and dry.  Neurological:     Mental Status: He is alert.     Comments: Orientation questions not asked  Psychiatric:        Mood and Affect: Mood normal.        Behavior: Behavior normal.    Vital Signs: BP (!) 160/71    Pulse 100    Temp 98 F (36.7 C) (Oral)    Resp (!) 27    Ht 5\' 10"  (1.778 m)    Wt 72.7 kg    SpO2 97%    BMI 23.00 kg/m  Pain Scale: 0-10 POSS *See Group Information*: 1-Acceptable,Awake and alert Pain Score: 0-No pain   SpO2: SpO2: 97 % O2 Device:SpO2: 97 % O2 Flow Rate: .O2 Flow Rate (L/min): 5 L/min  IO: Intake/output summary:  Intake/Output Summary (Last 24 hours) at 07/26/2021 1534 Last data filed at 07/26/2021 1509 Gross per 24 hour  Intake 574.37 ml  Output 3750 ml  Net -3175.63 ml    LBM: Last BM Date: 07/24/21 Baseline Weight: Weight: 70.3 kg Most recent weight: Weight: 72.7 kg     Palliative Assessment/Data:   Flowsheet Rows    Flowsheet Row Most Recent Value  Intake Tab   Referral Department Hospitalist  Unit at Time of Referral Intermediate Care Unit  Palliative Care Primary Diagnosis Other (Comment)  Date Notified 07/26/21  Palliative Care Type New Palliative care  Reason for referral Clarify Goals of Care  Date of Admission 07/25/21  Date first seen by Palliative Care 07/26/21  # of days Palliative referral response time 0 Day(s)  # of days IP prior to Palliative referral 1  Clinical Assessment   Palliative Performance Scale Score 30%  Pain Max last 24 hours Not able to report  Pain Min Last 24 hours Not able to report  Dyspnea  Max Last 24 Hours Not able to report  Dyspnea Min Last 24 hours Not able to report  Psychosocial & Spiritual Assessment   Palliative Care Outcomes        Time In: 2505 Time Out: 1600 Time Total: 75 minutes  Greater than 50%  of this time was spent counseling and coordinating care related to the above assessment and plan.  Signed by: Drue Novel, NP   Please contact Palliative Medicine Team phone at [phone] for questions and concerns.  For individual provider: See Shea Evans

## 2021-07-26 NOTE — Progress Notes (Signed)
SLP Cancellation Note  Patient Details Name: KILE KABLER MRN: [id number] DOB: 02-25-1933   Cancelled treatment:       Reason Eval/Treat Not Completed: Other (comment): SLP discussed with Dr. Carles Collet who shared Pt's current medical complications and advised that SLP services not needed at this point. SLP will sign off. Reconsult if Pt becomes appropriate.   Thank you,  Genene Churn, [place]    [place] 07/26/2021, 2:57 PM

## 2021-07-26 NOTE — Progress Notes (Signed)
Earlier today, I discussed CT abd/pelvis findings with radiology, Dr. Tery Sanfilippo.  --subtotal splenic infarct --Heterogeneous liver perfusion is new in the 1 week interval since prior study, potentially also related to differential low arterial perfusion --concerned about hemodynamic significant stenosis of SMA and celiac axis -also discussed with IR>>not a candidate for vascular stent given pt's degree of ASVD -CTA abd would not be helpful  Given prior CT abd/pelvis on 07/20/21--pt likely has chronic ischemic of SB and colon -current presentation with sepsis and relative hypotension likely created hypoperfusion resulting in acute on chronic ischemia to the gut  -discussed with vascular and general surgery--patient is not a good surgical candidate due to his advanced age, comorbidities   --discussed with GI, Dr. Abbey Chatters --only option is to treat medically>>start IV heparin  -had goals of care discussion with daughter and granddaughter at bedside--confirmed DNR -they are considering taking pt home with hospice care -palliative medicine consulted  Shanon Brow Milagro Belmares  Total time 65 min above the 45 min spent earlier today

## 2021-07-26 NOTE — Progress Notes (Signed)
ANTICOAGULATION CONSULT NOTE - Follow Up Consult  Pharmacy Consult for heparin Indication:  VTE treatment  - splenic infarct  No Known Allergies  Patient Measurements: Height: 5\' 10"  (177.8 cm) Weight: 72.7 kg (160 lb 4.4 oz) IBW/kg (Calculated) : 73 Heparin Dosing Weight: 72 kg  Vital Signs: Temp: 97.4 F (36.3 C) (01/16 1947) Temp Source: Oral (01/16 1947) BP: 177/62 (01/16 1800) Pulse Rate: 102 (01/16 1918)  Labs: Recent Labs    07/25/21 0505 07/26/21 0510 07/26/21 1936  HGB 15.4 12.3*   12.2*  --   HCT 46.0 37.3*   36.6*  --   PLT 422* 350   349  --   APTT 27  --   --   LABPROT 15.5*  --   --   INR 1.2  --   --   HEPARINUNFRC  --   --  0.16*  CREATININE 1.22 0.79 0.98     Estimated Creatinine Clearance: 55.6 mL/min (by C-G formula based on SCr of 0.98 mg/dL).   Medications:  Medications Prior to Admission  Medication Sig Dispense Refill Last Dose   acetaminophen (TYLENOL) 325 MG tablet Take 2 tablets (650 mg total) by mouth every 6 (six) hours as needed for mild pain, fever or headache (or Fever >/= 101). 12 tablet 0 unknown   albuterol (VENTOLIN HFA) 108 (90 Base) MCG/ACT inhaler Inhale 1-2 puffs into the lungs every 6 (six) hours as needed for wheezing or shortness of breath. 18 g 1 unknown   ALPRAZolam (XANAX) 0.5 MG tablet Take 0.5 mg by mouth 2 (two) times daily as needed for anxiety.   07/24/2021   amLODipine (NORVASC) 5 MG tablet Take 5 mg by mouth daily.    07/24/2021   amoxicillin-clavulanate (AUGMENTIN) 875-125 MG tablet Take 1 tablet by mouth 2 (two) times daily for 5 days. 10 tablet 0 07/24/2021   aspirin EC 81 MG EC tablet Take 1 tablet (81 mg total) by mouth daily with breakfast. 30 tablet 12 unknown   finasteride (PROSCAR) 5 MG tablet Take 1 tablet (5 mg total) by mouth daily after lunch. 90 tablet 2 07/24/2021   lisinopril (ZESTRIL) 40 MG tablet Take 1 tablet (40 mg total) by mouth daily. 30 tablet 3 07/24/2021   mometasone-formoterol (DULERA) 200-5  MCG/ACT AERO Inhale 2 puffs into the lungs 2 (two) times daily. 13 g 2 07/24/2021   pantoprazole (PROTONIX) 40 MG tablet Take 1 tablet (40 mg total) by mouth daily. TAKE (1) TABLET BY MOUTH ONCE DAILY BEFORE BREAKFAST. Strength: 40 mg 90 tablet 2 07/24/2021   Potassium Chloride ER 20 MEQ TBCR Take 20 mEq by mouth daily. 1 tab daily by mouth 10 tablet 0 07/24/2021   ondansetron (ZOFRAN) 4 MG tablet Take 1 tablet (4 mg total) by mouth every 6 (six) hours as needed for nausea. (Patient not taking: Reported on 07/25/2021) 20 tablet 0 Not Taking   polyethylene glycol (MIRALAX / GLYCOLAX) 17 g packet Take 17 g by mouth daily as needed for mild constipation. 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  Heparin level is subtherapeutic on 1150 units/hr.  No IV or bleeding issues noted.  Increase heparin to 1300 units/hr.  Noted family considering home with hospice care.  Will limit extra lab draws and defer next heparin level to be drawn with AM labs.  CBC WNL  Goal of Therapy:  Heparin level 0.3-0.7 units/ml Monitor  platelets by anticoagulation protocol: Yes   Plan:  Increase heparin infusion to 1300 units/hr Next heparin level and CBC with AM labs. Continue to monitor H&H and platelets  [employer], Pharm.D., BCPS Clinical Pharmacist  07/26/2021 8:47 PM

## 2021-07-27 ENCOUNTER — Inpatient Hospital Stay (HOSPITAL_COMMUNITY): Payer: Medicare Other

## 2021-07-27 DIAGNOSIS — K559 Vascular disorder of intestine, unspecified: Secondary | ICD-10-CM

## 2021-07-27 LAB — COMPREHENSIVE METABOLIC PANEL
ALT: 1967 U/L — ABNORMAL HIGH (ref 0–44)
AST: 1011 U/L — ABNORMAL HIGH (ref 15–41)
Albumin: 2.5 g/dL — ABNORMAL LOW (ref 3.5–5.0)
Alkaline Phosphatase: 185 U/L — ABNORMAL HIGH (ref 38–126)
Anion gap: 10 (ref 5–15)
BUN: 39 mg/dL — ABNORMAL HIGH (ref 8–23)
CO2: 21 mmol/L — ABNORMAL LOW (ref 22–32)
Calcium: 8.5 mg/dL — ABNORMAL LOW (ref 8.9–10.3)
Chloride: 108 mmol/L (ref 98–111)
Creatinine, Ser: 1.06 mg/dL (ref 0.61–1.24)
GFR, Estimated: >60 mL/min (ref >60)
Glucose, Bld: 125 mg/dL — ABNORMAL HIGH (ref 70–99)
Potassium: 3.8 mmol/L (ref 3.5–5.1)
Sodium: 139 mmol/L (ref 135–145)
Total Bilirubin: 1.5 mg/dL — ABNORMAL HIGH (ref 0.3–1.2)
Total Protein: 5.6 g/dL — ABNORMAL LOW (ref 6.5–8.1)

## 2021-07-27 LAB — CBC WITH DIFFERENTIAL/PLATELET
Abs Immature Granulocytes: 2.48 10*3/uL — ABNORMAL HIGH (ref 0.00–0.07)
Basophils Absolute: 0.2 10*3/uL — ABNORMAL HIGH (ref 0.0–0.1)
Basophils Relative: 0 %
Eosinophils Absolute: 0 10*3/uL (ref 0.0–0.5)
Eosinophils Relative: 0 %
HCT: 38.2 % — ABNORMAL LOW (ref 39.0–52.0)
Hemoglobin: 13 g/dL (ref 13.0–17.0)
Immature Granulocytes: 4 %
Lymphocytes Relative: 1 %
Lymphs Abs: 0.8 10*3/uL (ref 0.7–4.0)
MCH: 33.6 pg (ref 26.0–34.0)
MCHC: 34 g/dL (ref 30.0–36.0)
MCV: 98.7 fL (ref 80.0–100.0)
Monocytes Absolute: 2.1 10*3/uL — ABNORMAL HIGH (ref 0.1–1.0)
Monocytes Relative: 3 %
Neutro Abs: 64.7 10*3/uL — ABNORMAL HIGH (ref 1.7–7.7)
Neutrophils Relative %: 92 %
Platelets: 384 10*3/uL (ref 150–400)
RBC: 3.87 MIL/uL — ABNORMAL LOW (ref 4.22–5.81)
RDW: 13.4 % (ref 11.5–15.5)
WBC: 70.2 10*3/uL (ref 4.0–10.5)
nRBC: 0.3 % — ABNORMAL HIGH (ref 0.0–0.2)

## 2021-07-27 LAB — HEPARIN LEVEL (UNFRACTIONATED)
Heparin Unfractionated: <0.1 IU/mL — ABNORMAL LOW (ref 0.30–0.70)
Heparin Unfractionated: 0.16 IU/mL — ABNORMAL LOW (ref 0.30–0.70)

## 2021-07-27 LAB — PROCALCITONIN: Procalcitonin: 11.63 ng/mL

## 2021-07-27 MED ORDER — HEPARIN BOLUS VIA INFUSION
2000.0000 [IU] | Freq: Once | INTRAVENOUS | Status: AC
  Administered 2021-07-27: 2000 [IU] via INTRAVENOUS
  Filled 2021-07-27: qty 2000

## 2021-07-27 MED ORDER — OXYCODONE-ACETAMINOPHEN 5-325 MG PO TABS: 1.0000 | ORAL_TABLET | ORAL | 0 refills | Status: AC | PRN

## 2021-07-27 MED ORDER — TRAMADOL HCL 50 MG PO TABS
50.0000 mg | ORAL_TABLET | Freq: Four times a day (QID) | ORAL | Status: DC | PRN
  Administered 2021-07-27: 50 mg via ORAL
  Filled 2021-07-27: qty 1

## 2021-07-27 MED ORDER — FUROSEMIDE 10 MG/ML IJ SOLN
40.0000 mg | Freq: Once | INTRAMUSCULAR | Status: AC
  Administered 2021-07-27: 40 mg via INTRAVENOUS
  Filled 2021-07-27: qty 4

## 2021-07-27 MED ORDER — CHLORHEXIDINE GLUCONATE CLOTH 2 % EX PADS
6.0000 | MEDICATED_PAD | Freq: Every day | CUTANEOUS | Status: DC
  Administered 2021-07-27: 6 via TOPICAL

## 2021-07-27 MED ORDER — OXYCODONE-ACETAMINOPHEN 5-325 MG PO TABS
1.0000 | ORAL_TABLET | ORAL | Status: DC | PRN
  Filled 2021-07-27: qty 1

## 2021-07-27 NOTE — TOC Transition Note (Signed)
Transition of Care [facility]) - CM/SW Discharge Note   Patient Details  Name: ADOLPHO MEENACH MRN: [id number] Date of Birth: 25-Mar-1933  Transition of Care [facility]) CM/SW Contact:  Boneta Lucks, RN Phone Number: 07/27/2021, 3:34 PM   Clinical Narrative:   [facility] call DME has been  delivered. TOC called EMS, they will do their best to transport before 8pm, RN updated.   Final next level of care: Home w Hospice Care Barriers to Discharge: Barriers Resolved  Patient Goals and CMS Choice Patient states their goals for this hospitalization and ongoing recovery are:: to go home. CMS Medicare.gov Compare Post Acute Care list provided to:: Patient Represenative (must comment) Choice offered to / list presented to : Adult Children  Discharge Placement           Patient and family notified of of transfer: 07/27/21  Discharge Plan and Services     [place] Date [place]: 07/27/21   Representative spoke with at [place]: [place]  Readmission Risk Interventions Readmission Risk Prevention Plan 07/27/2021  Transportation Screening Complete  PCP or Specialist Appt within 3-5 Days Complete  HRI or Home Care Consult Complete  Social Work Consult for [place] Planning/Counseling Complete  Palliative Care Screening Complete  Medication Review [employer]) Complete  Some recent data might be hidden

## 2021-07-27 NOTE — Progress Notes (Signed)
ANTICOAGULATION CONSULT NOTE - Follow Up Consult  Pharmacy Consult for heparin Indication:  VTE treatment  - splenic infarct  No Known Allergies  Patient Measurements: Height: 5\' 10"  (177.8 cm) Weight: 73.1 kg (161 lb 2.5 oz) IBW/kg (Calculated) : 73 Heparin Dosing Weight: 72 kg  Vital Signs: Temp: 98.3 F (36.8 C) (01/17 1123) Temp Source: Axillary (01/17 1123) BP: 159/45 (01/17 1330) Pulse Rate: 87 (01/17 1339)  Labs: Recent Labs    07/25/21 0505 07/26/21 0510 07/26/21 1936 07/27/21 0423 07/27/21 1305  HGB 15.4 12.3*   12.2*  --  13.0  --   HCT 46.0 37.3*   36.6*  --  38.2*  --   PLT 422* 350   349  --  384  --   APTT 27  --   --   --   --   LABPROT 15.5*  --   --   --   --   INR 1.2  --   --   --   --   HEPARINUNFRC  --   --  0.16* <0.10* 0.16*  CREATININE 1.22 0.79 0.98 1.06  --      Estimated Creatinine Clearance: 51.7 mL/min (by C-G formula based on SCr of 1.06 mg/dL).   Medications:  Medications Prior to Admission  Medication Sig Dispense Refill Last Dose   acetaminophen (TYLENOL) 325 MG tablet Take 2 tablets (650 mg total) by mouth every 6 (six) hours as needed for mild pain, fever or headache (or Fever >/= 101). 12 tablet 0 unknown   albuterol (VENTOLIN HFA) 108 (90 Base) MCG/ACT inhaler Inhale 1-2 puffs into the lungs every 6 (six) hours as needed for wheezing or shortness of breath. 18 g 1 unknown   ALPRAZolam (XANAX) 0.5 MG tablet Take 0.5 mg by mouth 2 (two) times daily as needed for anxiety.   07/24/2021   amLODipine (NORVASC) 5 MG tablet Take 5 mg by mouth daily.    07/24/2021   amoxicillin-clavulanate (AUGMENTIN) 875-125 MG tablet Take 1 tablet by mouth 2 (two) times daily for 5 days. 10 tablet 0 07/24/2021   aspirin EC 81 MG EC tablet Take 1 tablet (81 mg total) by mouth daily with breakfast. 30 tablet 12 unknown   finasteride (PROSCAR) 5 MG tablet Take 1 tablet (5 mg total) by mouth daily after lunch. 90 tablet 2 07/24/2021   lisinopril (ZESTRIL) 40  MG tablet Take 1 tablet (40 mg total) by mouth daily. 30 tablet 3 07/24/2021   mometasone-formoterol (DULERA) 200-5 MCG/ACT AERO Inhale 2 puffs into the lungs 2 (two) times daily. 13 g 2 07/24/2021   pantoprazole (PROTONIX) 40 MG tablet Take 1 tablet (40 mg total) by mouth daily. TAKE (1) TABLET BY MOUTH ONCE DAILY BEFORE BREAKFAST. Strength: 40 mg 90 tablet 2 07/24/2021   Potassium Chloride ER 20 MEQ TBCR Take 20 mEq by mouth daily. 1 tab daily by mouth 10 tablet 0 07/24/2021   ondansetron (ZOFRAN) 4 MG tablet Take 1 tablet (4 mg total) by mouth every 6 (six) hours as needed for nausea. (Patient not taking: Reported on 07/25/2021) 20 tablet 0 Not Taking   polyethylene glycol (MIRALAX / GLYCOLAX) 17 g packet Take 17 g by mouth daily as needed for mild constipation. 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  Heparin level is subtherapeutic on 1150 units/hr.  No IV or bleeding issues noted.  Increase heparin to  1300 units/hr.  Noted family considering home with hospice care.  Will limit extra lab draws and defer next heparin level to be drawn with AM labs.  CBC WNL  HL 0.26, subtherapeutic   Goal of Therapy:  Heparin level 0.3-0.7 units/ml Monitor platelets by anticoagulation protocol: Yes   Plan:  Heparin 2000 units bolus Inc heparin to 1750 units/hr 2200 heparin level  Donna Christen [place] Beck, PharmD, [facility] Clinical Pharmacist

## 2021-07-27 NOTE — Progress Notes (Signed)
Chaplain engaged in an initial visit with Pheonix, his daughter, and his sister.  Chaplain spent time getting to know about Kazumi's current situation.  Daughter shared her dad's strong desire to be at home.  Czar voiced that he has lived in his home since the 87's.  He was also able to state that he couldn't wait to get back home.  Daughter is working to honor her father.  Though she did not have a good experience with residential hospice concerning her mother's care, she would have chose residential hospice for her dad if he did not prefer to go home instead.  Daughter also noted that her family will all work together to be there for Lennart and create a schedule of not leaving him alone.  Daughter shared that Johari was a great provider and worker growing up.  She has found it hard to think back to her childhood with all the losses she has suffered in the last couple of years which have included her mom, husband, and two best friends.  She is trying her hardest to just be there for her dad.  Chaplain offered listening, presence and support.  Chaplain was able to offer a blessing/prayer over Mildred.     07/27/21 1100  Clinical Encounter Type  Visited With Patient and family together  Visit Type Initial;Spiritual support  Stress Factors  Family Stress Factors Major life changes;Loss of control;Exhausted

## 2021-07-27 NOTE — Progress Notes (Signed)
Pt oxygen sat's hanging around 87-88% on current amount of oxygen which is 5L nasal cannula. HFNC 7L applied and patient is trying to compensate with O2 sat's around 89-90%. MD made aware, will continue to monitor.

## 2021-07-27 NOTE — TOC Progression Note (Signed)
Transition of Care [facility]) - Progression Note    Patient Details  Name: Peter [name] MRN: [id number] Date of Birth: Jun 18, 1933  Transition of Care [facility]) CM/SW Contact  Salome Arnt, [place] Phone Number: 07/27/2021, 10:16 AM  Clinical Narrative:  Discussed pt with MD and palliative. Plan for d/c home with [facility]. LCSW spoke with daughter to confirm. She states it is pt's wish to die at home. However, she is concerned about the amount of care needed. Pt had 24/7 sitters at home prior to admission, but they are not sure they can handle now as care needs have changed. LCSW requested hospice call daughter to provide more information on what care is needed. Pt's daughter states she will do what she needs to do to get him home. She requests a hospital bed and over the bed table. Pt also requiring O2 at 7 liters. Hospice notified and will order. MD updated. TOC awaiting return call from hospice.           Expected Discharge Plan and Services                                                 Social Determinants of Health (SDOH) Interventions    Readmission Risk Interventions No flowsheet data found.

## 2021-07-27 NOTE — Progress Notes (Signed)
Nsg Discharge Note  Admit Date:  07/25/2021 Discharge date: 07/27/2021   Peter [name] to be D/C'd Home per MD order.  AVS completed.  Copy for chart, and copy for patient signed, and dated. Patient/caregiver able to verbalize understanding.  Discharge Medication: Allergies as of 07/27/2021   No Known Allergies      Medication List     STOP taking these medications    albuterol 108 (90 Base) MCG/ACT inhaler Commonly known as: VENTOLIN HFA   amLODipine 5 MG tablet Commonly known as: NORVASC   amoxicillin-clavulanate 875-125 MG tablet Commonly known as: Augmentin   finasteride 5 MG tablet Commonly known as: PROSCAR   lisinopril 40 MG tablet Commonly known as: ZESTRIL   mometasone-formoterol 200-5 MCG/ACT Aero Commonly known as: DULERA   ondansetron 4 MG tablet Commonly known as: ZOFRAN   pantoprazole 40 MG tablet Commonly known as: PROTONIX   polyethylene glycol 17 g packet Commonly known as: MIRALAX / GLYCOLAX   Potassium Chloride ER 20 MEQ Tbcr       TAKE these medications    acetaminophen 325 MG tablet Commonly known as: TYLENOL Take 2 tablets (650 mg total) by mouth every 6 (six) hours as needed for mild pain, fever or headache (or Fever >/= 101).   ALPRAZolam 0.5 MG tablet Commonly known as: XANAX Take 0.5 mg by mouth 2 (two) times daily as needed for anxiety.   aspirin 81 MG EC tablet Take 1 tablet (81 mg total) by mouth daily with breakfast.   oxyCODONE-acetaminophen 5-325 MG tablet Commonly known as: PERCOCET/ROXICET Take 1 tablet by mouth every 4 (four) hours as needed for moderate pain.        Discharge Assessment: Vitals:   07/27/21 1530 07/27/21 1600  BP: (!) 155/36 (!) 150/48  Pulse: 88 95  Resp: (!) 29 (!) 34  Temp:    SpO2: 94% 93%   Skin clean, dry and intact without evidence of skin break down, no evidence of skin tears noted. IV catheter discontinued intact. Site without signs and symptoms of complications - no redness or  edema noted at insertion site, patient denies c/o pain - only slight tenderness at site.  Dressing with slight pressure applied.  D/c Instructions-Education: Discharge instructions given to patient/family with verbalized understanding. D/c education completed with patient/family including follow up instructions, medication list, d/c activities limitations if indicated, with other d/c instructions as indicated by MD - patient able to verbalize understanding, all questions fully answered. Patient instructed to return to ED, call 911, or call MD for any changes in condition.  Patient escorted via EMS, and D/C home  with hospice services.  Carney Corners, RN 07/27/2021 5:02 PM

## 2021-07-27 NOTE — Progress Notes (Signed)
ANTICOAGULATION CONSULT NOTE - Follow Up Consult  Pharmacy Consult for heparin Indication:  VTE treatment  - splenic infarct  No Known Allergies  Patient Measurements: Height: 5\' 10"  (177.8 cm) Weight: 73.1 kg (161 lb 2.5 oz) IBW/kg (Calculated) : 73 Heparin Dosing Weight: 72 kg  Vital Signs: Temp: 98.8 F (37.1 C) (01/17 0417) Temp Source: Axillary (01/17 0417) BP: 101/52 (01/17 0200) Pulse Rate: 91 (01/17 0200)  Labs: Recent Labs    07/25/21 0505 07/26/21 0510 07/26/21 1936 07/27/21 0423  HGB 15.4 12.3*   12.2*  --  13.0  HCT 46.0 37.3*   36.6*  --  38.2*  PLT 422* 350   349  --  384  APTT 27  --   --   --   LABPROT 15.5*  --   --   --   INR 1.2  --   --   --   HEPARINUNFRC  --   --  0.16* <0.10*  CREATININE 1.22 0.79 0.98  --      Estimated Creatinine Clearance: 55.9 mL/min (by C-G formula based on SCr of 0.98 mg/dL).   Medications:  Medications Prior to Admission  Medication Sig Dispense Refill Last Dose   acetaminophen (TYLENOL) 325 MG tablet Take 2 tablets (650 mg total) by mouth every 6 (six) hours as needed for mild pain, fever or headache (or Fever >/= 101). 12 tablet 0 unknown   albuterol (VENTOLIN HFA) 108 (90 Base) MCG/ACT inhaler Inhale 1-2 puffs into the lungs every 6 (six) hours as needed for wheezing or shortness of breath. 18 g 1 unknown   ALPRAZolam (XANAX) 0.5 MG tablet Take 0.5 mg by mouth 2 (two) times daily as needed for anxiety.   07/24/2021   amLODipine (NORVASC) 5 MG tablet Take 5 mg by mouth daily.    07/24/2021   amoxicillin-clavulanate (AUGMENTIN) 875-125 MG tablet Take 1 tablet by mouth 2 (two) times daily for 5 days. 10 tablet 0 07/24/2021   aspirin EC 81 MG EC tablet Take 1 tablet (81 mg total) by mouth daily with breakfast. 30 tablet 12 unknown   finasteride (PROSCAR) 5 MG tablet Take 1 tablet (5 mg total) by mouth daily after lunch. 90 tablet 2 07/24/2021   lisinopril (ZESTRIL) 40 MG tablet Take 1 tablet (40 mg total) by mouth daily. 30  tablet 3 07/24/2021   mometasone-formoterol (DULERA) 200-5 MCG/ACT AERO Inhale 2 puffs into the lungs 2 (two) times daily. 13 g 2 07/24/2021   pantoprazole (PROTONIX) 40 MG tablet Take 1 tablet (40 mg total) by mouth daily. TAKE (1) TABLET BY MOUTH ONCE DAILY BEFORE BREAKFAST. Strength: 40 mg 90 tablet 2 07/24/2021   Potassium Chloride ER 20 MEQ TBCR Take 20 mEq by mouth daily. 1 tab daily by mouth 10 tablet 0 07/24/2021   ondansetron (ZOFRAN) 4 MG tablet Take 1 tablet (4 mg total) by mouth every 6 (six) hours as needed for nausea. (Patient not taking: Reported on 07/25/2021) 20 tablet 0 Not Taking   polyethylene glycol (MIRALAX / GLYCOLAX) 17 g packet Take 17 g by mouth daily as needed for mild constipation. 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  Heparin level is subtherapeutic on 1150 units/hr.  No IV or bleeding issues noted.  Increase heparin to 1300 units/hr.  Noted family considering home with hospice care.  Will limit extra lab draws and defer next heparin level to  be drawn with AM labs.  CBC WNL  1/17 AM update:  Heparin level low  Goal of Therapy:  Heparin level 0.3-0.7 units/ml Monitor platelets by anticoagulation protocol: Yes   Plan:  Heparin 2000 units bolus Inc heparin to 1450 units/hr 1300 heparin level  Narda Bonds, PharmD, BCPS Clinical Pharmacist Phone: [phone]

## 2021-07-27 NOTE — Discharge Summary (Signed)
Physician Discharge Summary  Peter [name] ZOX:[id number] DOB: September 09, 1932 DOA: 07/25/2021  PCP: Redmond School, MD  Admit date: 07/25/2021 Discharge date: 07/27/2021  Admitted From: Home Disposition:  Home with [place]: Home with hospice Equipment/Devices: Hospital Bed  Discharge Condition: Stable CODE STATUS:FULL COMFORT Diet recommendation:  Regular/Comfort Feeds   Brief/Interim Summary: 88 y.o. male with medical history of COPD, tobacco abuse, dysphagia, peripheral arterial disease, coronary disease presenting with diarrhea, coughing, shortness of breath.  Patient was recently discharged from the hospital after a stay from 07/20/21 to 07/22/21 when he was treated for his diarrhea and aspiration pneumonia.  The patient was discharged home with Augmentin.  He states that he has taken about 4 doses of Augmentin since discharge from the hospital.  His C. difficile assay was negative at that time.  The patient states that he was feeling well at the time of discharge without any shortness of breath, and his diarrhea had subsided.  However, on 07/24/2021, he began having increasing shortness of breath and worsening diarrhea.  He states that he had up to 10 loose bowel movements without any blood or black tarry stool.  He has had some nausea but without any emesis.  He has had some "spit up" but denies any hematemesis.  However, he states that he has been spitting up some "dark stuff".  He denies any abdominal pain but has had some intermittent cramping.  In the early morning 07/25/2021, the patient was getting up to go to the bathroom when he had a near syncopal episode with increasing shortness of breath.  As result EMS was activated.   ED Patient was febrile to 101.9 F and tachycardic into the 140s.  He was hemodynamically stable.  Oxygen saturation 92% on room air.  BMP showed sodium 137, potassium 3.5, bicarbonate 17, serum creatinine 1.22.  AST 60, ALT 52, alk phosphatase 93,  total bilirubin 0.8.  WBC 34.5, hemoglobin 15.4, platelets 422,000.  Lactic acid was 7.9 >>8.1.  Patient was given 2 and half liters of lactated Ringer's and started on levofloxacin initially.  Chest x-ray showed left-sided infiltrate.  EKG showed atrial flutter with heart rate 150s, nonspecific ST changes.  COVID-19 was negative. Patient was started on IV and IV zosyn.  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  Given his advanced age, co-morbidities, he was not felt to be a good surgical candidate.  [place] discussion was held with patient and daughter.  They wanted to transition to full comfort focused care.  Patient wanted to go home with home hospice.  This transition was assisted by LCSW.  Discharge Diagnoses:   Severe Sepsis -Due to pneumonia and ischemic enteritis -Present on admission -Presented with fever, tachycardia, leukocytosis, elevated lactate -Started Zosyn and vancomycin -Continue IV fluids -Follow blood culture--neg to date -UA negative for pyuria -Check PCT 12.04>>11.63 -now transitioned to comfort focused care; plan d/c home with hospice   Acute Respiratory Failure with hypoxia -due to aspiration pneumonia, COPD exacerbation and fluid overload -now on 5L [place] -wean for saturation >90% -now transitioned to comfort focused care; plan d/c home with hospice   Aspiration pneumonia -Continue Zosyn initially -Evaluated by speech during his last admission>> dysphagia 3 diet with thin liquids --now transitioned to comfort focused care; plan d/c home with hospice   Fluid Overload -saline lock IVF -lasix IV 40 mg given x2   Ischemic Enteritis -Check C. Difficile--no  BM in past 24 hours -Stool pathogen panel--no BM in past 24 hours -CT abd/pelvis--subtotal splenic infarct --Heterogeneous liver perfusion is new in the 1 week interval since prior study, potentially also related to differential  low arterial perfusion --concerned about hemodynamic significant stenosis of SMA and celiac axis --discussed with IR>>not a candidate for vascular stent given pt's degree of ASVD -CTA abd would not be helpful --now transitioned to comfort focused care; plan d/c home with hospice   [place] Emesis -consult GI apprciated -started  protonix drip -1.3L out from NG before pt pulled out   Transaminasemia -due to ischemic hepatitis due to hemodynamic changes -viral hepatitis panel--neg -RUQ US--cholelithiasis without cholecystitis, fatty liver -GI consult appreciated   Lactic acidosis -Continue fluid resuscitation -Check ABG--7.319/26/68/15 RA -given >4L IVF -lactate 8.1>>2.6   COPD exacerbation -Start duo nebs -Continue IV Solu-Medrol -Continue Pulmicort   Atrial flutter -IV lopressor -now back in sinue -Echo EF 60-65%, no WMA, G1DD   Profound Leukocytosis -check diff--no BM to check -likely leukemoid reaction in setting of steroids -CT abd/pelvis -follow blood culture -UA--no pyuria -LDH--2193 -retic count -now transitioned to comfort focused care; plan d/c home with hospice   AKI -baseline creatinine 0.6-0.7 -presented with creatinine 1.22 -due to volume depletion and sepsis -improved with IVF -now transitioned to comfort focused care; plan d/c home with hospice   Urinary Retention -foley placed 1/16 AM   Coronary disease -No chest pain presently -EKG with atrial flutter nonspecific ST changes -Continue aspirin--holding due to hematemesis   Tobacco abuse -Tobacco cessation discussed   Essential hypertension -Holding lisinopril and amlodipine due to low blood pressure margin   Heme positive stool -Hgb stable -IV PPI drip -currently not stable for any endoscopic intervention -GI consult    Goals of Care -discussed with pt and daughter -confirmed DNR    No Known Allergies  Consultations: GI palliative   Procedures/Studies: CT HEAD WO  CONTRAST (5MM)  Result Date: 07/27/2021 CLINICAL DATA:  Stroke, hemorrhagic EXAM: CT HEAD WITHOUT CONTRAST TECHNIQUE: Contiguous axial images were obtained from the base of the skull through the vertex without intravenous contrast. RADIATION DOSE REDUCTION: This exam was performed according to the departmental dose-optimization program which includes automated exposure control, adjustment of the mA and/or kV according to patient size and/or use of iterative reconstruction technique. COMPARISON:  CT head 06/15/2019 BRAIN: BRAIN Cerebral ventricle sizes are concordant with the degree of cerebral volume loss. Patchy and confluent areas of decreased attenuation are noted throughout the deep and periventricular white matter of the cerebral hemispheres bilaterally, compatible with chronic microvascular ischemic disease. No evidence of large-territorial acute infarction. No parenchymal hemorrhage. No mass lesion. No extra-axial collection. No mass effect or midline shift. No hydrocephalus. Basilar cisterns are patent. Vascular: No hyperdense vessel. Atherosclerotic calcifications are present within the cavernous internal carotid arteries. Skull: No acute fracture or focal lesion. Sinuses/Orbits: Mucosal thickening of the left maxillary sinus. Otherwise paranasal sinuses and mastoid air cells are clear. Right lens replacement. Otherwise the orbits are unremarkable. Other: None. IMPRESSION: No acute intracranial abnormality. Electronically Signed   By: Iven Finn M.D.   On: 07/27/2021 01:14   CT ABDOMEN PELVIS W CONTRAST  Result Date: 07/26/2021 CLINICAL DATA:  Abdominal pain and diarrhea. EXAM: CT ABDOMEN AND PELVIS WITH CONTRAST TECHNIQUE: Multidetector CT imaging of the abdomen and pelvis was performed using the standard protocol following bolus administration of intravenous contrast. RADIATION DOSE REDUCTION: This exam was performed according to the departmental dose-optimization program which includes  automated exposure control, adjustment of the mA and/or kV according to patient size and/or use of iterative reconstruction technique. CONTRAST:  177m OMNIPAQUE IOHEXOL 300 MG/ML  SOLN COMPARISON:  07/20/2021 FINDINGS: Lower chest: New collapse/consolidation noted posterior left lower lobe. Hepatobiliary: Heterogeneous attenuation of liver parenchyma may be related to differential altered perfusion or geographic fatty deposition. Gallbladder is distended with gallstones noted measuring up to about 9 mm. No intrahepatic or extrahepatic biliary dilation. Pancreas: No focal mass lesion. No dilatation of the main duct. No intraparenchymal cyst. No peripancreatic edema. Spleen: Heterogeneous decreased perfusion. Adrenals/Urinary Tract: Nodular thickening left adrenal gland is stable. Right adrenal gland unremarkable. Bilateral renal cysts evident. No evidence for hydroureter. Foley catheter decompresses the urinary bladder. Stomach/Bowel: Stomach is fluid-filled and distended. Small hiatal hernia evident. Duodenum is distended and fluid-filled. Small bowel loops are diffusely fluid-filled. The terminal ileum is normal. The appendix is normal. Colon is fluid-filled and distended except in the left colon where there is advanced diverticular disease. No findings to suggest diverticulitis. Vascular/Lymphatic: There is advanced atherosclerotic calcification of the abdominal aorta without aneurysm. Celiac axis appears markedly attenuated and patency cannot be confirmed. SMA shows substantial calcific plaque at the origin possibly with possible opacification of a string like lumen. IMA appears to opacify. There is no gastrohepatic or hepatoduodenal ligament lymphadenopathy. No retroperitoneal or mesenteric lymphadenopathy. No pelvic sidewall lymphadenopathy. No pelvic sidewall lymphadenopathy. Reproductive: Prostate gland is mildly enlarged. Other: No intraperitoneal free fluid. Musculoskeletal: No worrisome lytic or sclerotic  osseous abnormality. IMPRESSION: 1. New collapse/consolidation posterior left lower lobe. Pneumonia a concern. 2. 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 The celiac axis shows marked atherosclerotic disease and it is difficult to appreciate a patent lumen in the celiac axis and splenic artery given the degree of associated atherosclerotic calcification. Sagittal and coronal imaging suggests that there may be residual string like lumen. Splenic artery thrombosis is a concern. Similarly, the SMA is poorly demonstrated and may also have marked stenotic disease. 3. Heterogeneous liver perfusion is new in the 1 week interval since prior study, potentially also related to differential low arterial perfusion. Geographic fatty deposition could also have this appearance although this would be relatively rapid interval appearance. 4. Fluid-filled, distended stomach, small bowel and colon. Imaging features are compatible with reported history of diarrhea. Currently there is no bowel wall thickening or pneumatosis to suggest bowel ischemia. No intraperitoneal free fluid. 5. Cholelithiasis. 6. Heterogeneous decreased perfusion of the liver. This may be related to differential altered perfusion or geographic fatty deposition. 7. 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 8. Prostatomegaly. 9. Aortic Atherosclerosis (ICD10-I70.0). Results of this study were discussed with Dr. TCarles Colletat the time of initial interpretation. Electronically Signed   By: EMisty StanleyM.D.   On: 07/26/2021 11:12   CT CHEST ABDOMEN PELVIS W CONTRAST  Result Date: 07/20/2021 CLINICAL DATA:  Cough, shortness of breath, diarrhea. Nodular opacity seen on prior chest x-ray EXAM: CT CHEST, ABDOMEN, AND PELVIS WITH CONTRAST TECHNIQUE: Multidetector CT imaging of the chest, abdomen and pelvis was performed following the standard protocol during bolus administration of intravenous contrast. RADIATION  DOSE REDUCTION: This exam was performed according to the departmental dose-optimization program which includes automated exposure control, adjustment of the mA and/or kV according to patient size and/or use of iterative reconstruction technique. CONTRAST:  1045mOMNIPAQUE IOHEXOL 300 MG/ML  SOLN COMPARISON:  Same-day chest radiograph, lumbar spine CT 09/21/2012 FINDINGS: CT CHEST FINDINGS Cardiovascular: The heart size is normal.  There is no pericardial effusion. Aortic valve, mitral annular, and coronary artery calcifications are noted. There is extensive calcified atherosclerotic plaque throughout the thoracic aorta. Mediastinum/Nodes: The thyroid is unremarkable. The esophagus is grossly unremarkable. There is no mediastinal, hilar, or axillary lymphadenopathy. Lungs/Pleura: The trachea and central airways are patent. There is debris in the distal trachea and left main bronchus and smaller distal airways. There is central bronchial wall thickening with areas of mucoid impaction. There is no focal consolidation or pulmonary edema. There is no pleural effusion or pneumothorax. Minimal linear opacities in the left base likely reflect subsegmental atelectasis. There are no suspicious nodules. The finding on the prior chest radiograph was likely artifactual. Musculoskeletal: There is no acute osseous abnormality or aggressive osseous lesion. There is multilevel degenerative change of the thoracic spine with flowing anterior osteophytes across the lower thoracic/upper lumbar vertebral bodies consistent with diffuse idiopathic skeletal hyperostosis. CT ABDOMEN PELVIS FINDINGS Hepatobiliary: The liver is unremarkable. No focal lesions are seen. The gallbladder is distended but otherwise unremarkable, without evidence of acute cholecystitis. There is no biliary ductal dilatation. Pancreas: A subcentimeter cystic lesion in the pancreatic tail may reflect an intraductal papillary mucinous neoplasm (2-62). The pancreas is  otherwise unremarkable. There are no other focal lesions. There is no main pancreatic ductal dilatation or peripancreatic inflammatory change. Spleen: Unremarkable. Adrenals/Urinary Tract: Adrenals are unremarkable. Multiple renal cysts are noted bilaterally, with 1 on the left demonstrating a thin internal septation. There are no solid or suspicious lesions. Calcifications in the kidneys are favored to reflect vascular calcifications as opposed to renal stones. There is bilateral perinephric stranding, nonspecific. There is no hydronephrosis or hydroureter. The bladder is distended. There is a small right posterior bladder diverticulum likely reflecting chronic outlet obstruction (2-107). Stomach/Bowel: There is a small hiatal hernia. The stomach is otherwise unremarkable, allowing for the degree of decompression. There is no evidence of bowel obstruction. There is extensive sigmoid diverticulosis without evidence of acute diverticulitis. A loop of small bowel is herniated into the right inguinal canal without evidence of obstruction or strangulation. The appendix is normal. Vascular/Lymphatic: There are extensive vascular calcifications throughout the abdomen and pelvis likely resulting in high-grade stenoses of the celiac artery, SMA, bilateral renal arteries, and bilateral iliofemoral vessels. The main portal and splenic veins are patent. There is no abdominal or pelvic lymphadenopathy. Reproductive: Prostate is enlarged measuring up to 5.8 cm transverse. The prostate impresses upon the inferior aspect of the bladder. Other: There is no ascites or free air. Is a bowel containing right inguinal hernia, as above. Musculoskeletal: There is no acute osseous abnormality or aggressive osseous lesion. There are multilevel degenerative changes throughout the lumbar spine. IMPRESSION: 1. Debris in the trachea and airways with associated bronchial wall thickening suspicious for aspiration. 2. Otherwise, clear lungs with  no focal consolidation or suspicious nodule. The finding on the prior radiograph was likely artifactual. 3. No acute findings in the abdomen or pelvis. 4. Right inguinal hernia containing a loop of small bowel without evidence of obstruction or incarceration. 5. Enlarged prostate which impresses upon the inferior aspect of the bladder. A small right posterior bladder diverticulum likely reflects chronic outlet obstruction. 6. Diverticulosis without evidence of acute diverticulitis. 7. Subcentimeter cystic lesion in the pancreatic tail may reflect an intraductal papillary mucinous neoplasm. 8. Multilevel degenerative change of the spine with diffuse idiopathic skeletal hyperostosis. 9. Extensive vascular calcifications throughout the thoracoabdominal aorta and major branch vessels likely resulting in high-grade stenoses throughout the abdominal/pelvic vasculature. 10.  Aortic valve, mitral annular, and coronary artery calcifications. Aortic Atherosclerosis (ICD10-I70.0). Electronically Signed   By: Valetta Mole M.D.   On: 07/20/2021 15:26   DG CHEST PORT 1 VIEW  Result Date: 07/25/2021 CLINICAL DATA:  Check NG placement EXAM: PORTABLE CHEST 1 VIEW COMPARISON:  Film from earlier in the same day. FINDINGS: Cardiac shadow is within normal limits. Aortic calcifications are again seen. Patchy infiltrate is noted in the left lung although improved compared with exam. Gastric catheter is noted coiled within the oropharynx. This should be withdrawn completely and readvanced. IMPRESSION: Gastric catheter coiled within the oropharynx. This should be withdrawn and readvanced. Improving aeration in the left lung. Electronically Signed   By: Inez Catalina M.D.   On: 07/25/2021 20:35   DG Chest Port 1 View  Result Date: 07/25/2021 CLINICAL DATA:  Questionable sepsis EXAM: PORTABLE CHEST 1 VIEW COMPARISON:  Five days ago FINDINGS: New airspace disease on the left. Clear right lung. Normal heart size and mediastinal contours.  Extensive artifact from EKG leads. IMPRESSION: New, extensive pneumonia on the left. Electronically Signed   By: Jorje Guild M.D.   On: 07/25/2021 05:27   DG Chest Portable 1 View  Result Date: 07/20/2021 CLINICAL DATA:  Cough, shortness of breath EXAM: PORTABLE CHEST 1 VIEW COMPARISON:  Chest radiograph 10/20/2019 FINDINGS: The cardiomediastinal silhouette is normal There is no focal consolidation or pulmonary edema. There is a 1.2 cm nodular opacity projecting over the right lower lobe not seen on the prior study. The right costophrenic angle is partially excluded, but there is no significant right pleural effusion. There is no left effusion. There is no pneumothorax. There is no acute osseous abnormality. IMPRESSION: 1. 1.2 cm nodular opacity projecting over the right lower lobe. Recommend nonemergent CT chest for further evaluation. 2. No radiographic evidence of acute cardiopulmonary process. Electronically Signed   By: Valetta Mole M.D.   On: 07/20/2021 10:50   DG Chest Port 1V same Day  Result Date: 07/25/2021 CLINICAL DATA:  Enteric catheter repositioning, emesis EXAM: PORTABLE CHEST 1 VIEW COMPARISON:  07/25/2021 FINDINGS: Frontal view of the chest demonstrates 3 position of the enteric catheter, tip and side port projecting over the distal thoracic esophagus. Recommend advancing at least 6 cm to ensure side port placement within the gastric lumen. The cardiac silhouette is stable. Diffuse interstitial prominence, with stable left basilar airspace disease. No effusion or pneumothorax. No acute bony abnormality. IMPRESSION: 1. Enteric catheter tip and side port projecting over the distal thoracic esophagus. Recommend advancing 6 cm. 2. Stable left basilar airspace disease. Electronically Signed   By: Randa Ngo M.D.   On: 07/25/2021 20:51   ECHOCARDIOGRAM COMPLETE  Result Date: 07/25/2021    ECHOCARDIOGRAM REPORT   Patient Name:   SHAAN RHOADS Date of Exam: 07/25/2021 Medical Rec #:   [id number]      Height:       70.0 in Accession #:    [id number]     Weight:       160.3 lb Date of Birth:  03-12-1933      BSA:          1.900 m Patient Age:    46 years       BP:           135/76 mmHg Patient Gender: M              HR:           101 bpm. Exam Location:  [facility] Procedure: 2D Echo, Cardiac Doppler and Color Doppler Indications:    I48.92* Unspecified atrial flutter  History:        Patient has prior history of Echocardiogram examinations, most                 recent 03/08/2017. Abnormal ECG, COPD, Arrythmias:Atrial Flutter,                 Signs/Symptoms:Dyspnea and Shortness of Breath; Risk                 Factors:Hypertension, Dyslipidemia and Current Smoker.  Sonographer:    Roseanna Rainbow RDCS Referring Phys: [phone] Antasia Haider  Sonographer Comments: Technically difficult study due to poor echo windows. Image acquisition challenging due to respiratory motion. IMPRESSIONS  1. Left ventricular ejection fraction, by estimation, is 60 to 65%. The left ventricle has normal function. The left ventricle has no regional wall motion abnormalities. Left ventricular diastolic parameters are consistent with Grade I diastolic dysfunction (impaired relaxation).  2. Right ventricular systolic function is normal. The right ventricular size is normal.  3. Interatrial septal lipoma.  4. The mitral valve is grossly normal. No evidence of mitral valve regurgitation.  5. Focal calcification. The aortic valve was not well visualized. Aortic valve regurgitation is not visualized.  6. The inferior vena cava is dilated in size with >50% respiratory variability, suggesting right atrial pressure of 8 mmHg. FINDINGS  Left Ventricle: Left ventricular ejection fraction, by estimation, is 60 to 65%. The left ventricle has normal function. The left ventricle has no regional wall motion abnormalities. The left ventricular internal cavity size was normal in size. There is  no left ventricular hypertrophy. Left ventricular diastolic  parameters are consistent with Grade I diastolic dysfunction (impaired relaxation). Right Ventricle: The right ventricular size is normal. No increase in right ventricular wall thickness. Right ventricular systolic function is normal. Left Atrium: Left atrial size was normal in size. Right Atrium: Right atrial size was normal in size. Pericardium: There is no evidence of pericardial effusion. Mitral Valve: The mitral valve is grossly normal. There is mild calcification of the mitral valve leaflet(s). No evidence of mitral valve regurgitation. Tricuspid Valve: The tricuspid valve is not well visualized. Tricuspid valve regurgitation is not demonstrated. Aortic Valve: Focal calcification. The aortic valve was not well visualized. Aortic valve regurgitation is not visualized. Pulmonic Valve: The pulmonic valve was not well visualized. Pulmonic valve regurgitation is not visualized. Aorta: The aortic root and ascending aorta are structurally normal, with no evidence of dilitation. Venous: The inferior vena cava is dilated in size with greater than 50% respiratory variability, suggesting right atrial pressure of 8 mmHg. IAS/Shunts: No atrial level shunt detected by color flow Doppler.  LEFT VENTRICLE PLAX 2D LVIDd:         4.10 cm     Diastology LVIDs:         2.90 cm     LV e' medial:    5.22 cm/s LV PW:         1.00 cm     LV E/e' medial:  16.9 LV IVS:        1.10 cm     LV e' lateral:   6.09 cm/s LVOT diam:     2.30 cm     LV E/e' lateral: 14.5 LV SV:         95 LV SV Index:   50 LVOT Area:     4.15 cm  LV Volumes (MOD)  LV vol d, MOD A2C: 59.4 ml LV vol d, MOD A4C: 87.2 ml LV vol s, MOD A2C: 23.1 ml LV vol s, MOD A4C: 30.3 ml LV SV MOD A2C:     36.3 ml LV SV MOD A4C:     87.2 ml LV SV MOD BP:      45.8 ml RIGHT VENTRICLE             IVC RV S prime:     16.60 cm/s  IVC diam: 2.40 cm TAPSE (M-mode): 2.7 cm LEFT ATRIUM           Index        RIGHT ATRIUM           Index LA diam:      3.50 cm 1.84 cm/m   RA Area:      14.30 cm LA Vol (A2C): 26.6 ml 14.00 ml/m  RA Volume:   33.30 ml  17.53 ml/m LA Vol (A4C): 36.6 ml 19.26 ml/m  AORTIC VALVE LVOT Vmax:   114.00 cm/s LVOT Vmean:  72.900 cm/s LVOT VTI:    0.227 m  AORTA Ao Root diam: 3.60 cm Ao Asc diam:  3.50 cm MITRAL VALVE MV Area (PHT): 3.82 cm     SHUNTS MV Decel Time: 198 msec     Systemic VTI:  0.23 m MV E velocity: 88.10 cm/s   Systemic Diam: 2.30 cm MV A velocity: 129.00 cm/s MV E/A ratio:  0.68 Mary Scientist, physiological signed by Phineas Inches Signature Date/Time: 07/25/2021/4:49:27 PM    Final    US Abdomen Limited RUQ (LIVER/GB)  Result Date: 07/25/2021 CLINICAL DATA:  Abdominal pain EXAM: ULTRASOUND ABDOMEN LIMITED RIGHT UPPER QUADRANT COMPARISON:  12/20/2016 FINDINGS: Gallbladder: Gallbladder stones are seen. There is no abnormal wall thickening. Technologist did not observe any tenderness over the gallbladder. There is no fluid around the gallbladder. Common bile duct: Diameter: 2.4 mm Liver: There is increased echogenicity. No focal abnormality is seen. Portal vein is patent on color Doppler imaging with normal direction of blood flow towards the liver. Other: None. IMPRESSION: Gallbladder stones. There are no signs of acute cholecystitis. Fatty liver. Electronically Signed   By: Elmer Picker M.D.   On: 07/25/2021 10:22        Discharge Exam: Vitals:   07/27/21 1123 07/27/21 1130  BP:  (!) 153/51  Pulse: 97 97  Resp: (!) 36 (!) 27  Temp: 98.3 F (36.8 C)   SpO2: 93% 90%   Vitals:   07/27/21 1035 07/27/21 1100 07/27/21 1123 07/27/21 1130  BP:  (!) 156/52  (!) 153/51  Pulse: 90 93 97 97  Resp: (!) 29 (!) 33 (!) 36 (!) 27  Temp:   98.3 F (36.8 C)   TempSrc:   Axillary   SpO2: (!) 88% 92% 93% 90%  Weight:      Height:        General: Pt is alert, awake, not in acute distress Cardiovascular: RRR, S1/S2 +, no rubs, no gallops Respiratory: bilateral rhonchi Abdominal: Soft, NT, ND, bowel sounds + Extremities: no edema, no  cyanosis   The results of significant diagnostics from this hospitalization (including imaging, microbiology, ancillary and laboratory) are listed below for reference.    Significant Diagnostic Studies: CT HEAD WO CONTRAST (5MM)  Result Date: 07/27/2021 CLINICAL DATA:  Stroke, hemorrhagic EXAM: CT HEAD WITHOUT CONTRAST TECHNIQUE: Contiguous axial images were obtained from the base of the skull through the vertex without intravenous contrast. RADIATION DOSE REDUCTION: This  exam was performed according to the departmental dose-optimization program which includes automated exposure control, adjustment of the mA and/or kV according to patient size and/or use of iterative reconstruction technique. COMPARISON:  CT head 06/15/2019 BRAIN: BRAIN Cerebral ventricle sizes are concordant with the degree of cerebral volume loss. Patchy and confluent areas of decreased attenuation are noted throughout the deep and periventricular white matter of the cerebral hemispheres bilaterally, compatible with chronic microvascular ischemic disease. No evidence of large-territorial acute infarction. No parenchymal hemorrhage. No mass lesion. No extra-axial collection. No mass effect or midline shift. No hydrocephalus. Basilar cisterns are patent. Vascular: No hyperdense vessel. Atherosclerotic calcifications are present within the cavernous internal carotid arteries. Skull: No acute fracture or focal lesion. Sinuses/Orbits: Mucosal thickening of the left maxillary sinus. Otherwise paranasal sinuses and mastoid air cells are clear. Right lens replacement. Otherwise the orbits are unremarkable. Other: None. IMPRESSION: No acute intracranial abnormality. Electronically Signed   By: Iven Finn M.D.   On: 07/27/2021 01:14   CT ABDOMEN PELVIS W CONTRAST  Result Date: 07/26/2021 CLINICAL DATA:  Abdominal pain and diarrhea. EXAM: CT ABDOMEN AND PELVIS WITH CONTRAST TECHNIQUE: Multidetector CT imaging of the abdomen and pelvis was  performed using the standard protocol following bolus administration of intravenous contrast. RADIATION DOSE REDUCTION: This exam was performed according to the departmental dose-optimization program which includes automated exposure control, adjustment of the mA and/or kV according to patient size and/or use of iterative reconstruction technique. CONTRAST:  174m OMNIPAQUE IOHEXOL 300 MG/ML  SOLN COMPARISON:  07/20/2021 FINDINGS: Lower chest: New collapse/consolidation noted posterior left lower lobe. Hepatobiliary: Heterogeneous attenuation of liver parenchyma may be related to differential altered perfusion or geographic fatty deposition. Gallbladder is distended with gallstones noted measuring up to about 9 mm. No intrahepatic or extrahepatic biliary dilation. Pancreas: No focal mass lesion. No dilatation of the main duct. No intraparenchymal cyst. No peripancreatic edema. Spleen: Heterogeneous decreased perfusion. Adrenals/Urinary Tract: Nodular thickening left adrenal gland is stable. Right adrenal gland unremarkable. Bilateral renal cysts evident. No evidence for hydroureter. Foley catheter decompresses the urinary bladder. Stomach/Bowel: Stomach is fluid-filled and distended. Small hiatal hernia evident. Duodenum is distended and fluid-filled. Small bowel loops are diffusely fluid-filled. The terminal ileum is normal. The appendix is normal. Colon is fluid-filled and distended except in the left colon where there is advanced diverticular disease. No findings to suggest diverticulitis. Vascular/Lymphatic: There is advanced atherosclerotic calcification of the abdominal aorta without aneurysm. Celiac axis appears markedly attenuated and patency cannot be confirmed. SMA shows substantial calcific plaque at the origin possibly with possible opacification of a string like lumen. IMA appears to opacify. There is no gastrohepatic or hepatoduodenal ligament lymphadenopathy. No retroperitoneal or mesenteric  lymphadenopathy. No pelvic sidewall lymphadenopathy. No pelvic sidewall lymphadenopathy. Reproductive: Prostate gland is mildly enlarged. Other: No intraperitoneal free fluid. Musculoskeletal: No worrisome lytic or sclerotic osseous abnormality. IMPRESSION: 1. New collapse/consolidation posterior left lower lobe. Pneumonia a concern. 2. 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 The celiac axis shows marked atherosclerotic disease and it is difficult to appreciate a patent lumen in the celiac axis and splenic artery given the degree of associated atherosclerotic calcification. Sagittal and coronal imaging suggests that there may be residual string like lumen. Splenic artery thrombosis is a concern. Similarly, the SMA is poorly demonstrated and may also have marked stenotic disease. 3. Heterogeneous liver perfusion is new in the 1 week interval since prior study, potentially also related to differential low arterial perfusion. Geographic fatty deposition could also have  this appearance although this would be relatively rapid interval appearance. 4. Fluid-filled, distended stomach, small bowel and colon. Imaging features are compatible with reported history of diarrhea. Currently there is no bowel wall thickening or pneumatosis to suggest bowel ischemia. No intraperitoneal free fluid. 5. Cholelithiasis. 6. Heterogeneous decreased perfusion of the liver. This may be related to differential altered perfusion or geographic fatty deposition. 7. 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 8. Prostatomegaly. 9. Aortic Atherosclerosis (ICD10-I70.0). Results of this study were discussed with Dr. Carles Collet at the time of initial interpretation. Electronically Signed   By: Misty Stanley M.D.   On: 07/26/2021 11:12   CT CHEST ABDOMEN PELVIS W CONTRAST  Result Date: 07/20/2021 CLINICAL DATA:  Cough, shortness of breath, diarrhea. Nodular opacity seen on prior chest x-ray  EXAM: CT CHEST, ABDOMEN, AND PELVIS WITH CONTRAST TECHNIQUE: Multidetector CT imaging of the chest, abdomen and pelvis was performed following the standard protocol during bolus administration of intravenous contrast. RADIATION DOSE REDUCTION: This exam was performed according to the departmental dose-optimization program which includes automated exposure control, adjustment of the mA and/or kV according to patient size and/or use of iterative reconstruction technique. CONTRAST:  168m OMNIPAQUE IOHEXOL 300 MG/ML  SOLN COMPARISON:  Same-day chest radiograph, lumbar spine CT 09/21/2012 FINDINGS: CT CHEST FINDINGS Cardiovascular: The heart size is normal. There is no pericardial effusion. Aortic valve, mitral annular, and coronary artery calcifications are noted. There is extensive calcified atherosclerotic plaque throughout the thoracic aorta. Mediastinum/Nodes: The thyroid is unremarkable. The esophagus is grossly unremarkable. There is no mediastinal, hilar, or axillary lymphadenopathy. Lungs/Pleura: The trachea and central airways are patent. There is debris in the distal trachea and left main bronchus and smaller distal airways. There is central bronchial wall thickening with areas of mucoid impaction. There is no focal consolidation or pulmonary edema. There is no pleural effusion or pneumothorax. Minimal linear opacities in the left base likely reflect subsegmental atelectasis. There are no suspicious nodules. The finding on the prior chest radiograph was likely artifactual. Musculoskeletal: There is no acute osseous abnormality or aggressive osseous lesion. There is multilevel degenerative change of the thoracic spine with flowing anterior osteophytes across the lower thoracic/upper lumbar vertebral bodies consistent with diffuse idiopathic skeletal hyperostosis. CT ABDOMEN PELVIS FINDINGS Hepatobiliary: The liver is unremarkable. No focal lesions are seen. The gallbladder is distended but otherwise  unremarkable, without evidence of acute cholecystitis. There is no biliary ductal dilatation. Pancreas: A subcentimeter cystic lesion in the pancreatic tail may reflect an intraductal papillary mucinous neoplasm (2-62). The pancreas is otherwise unremarkable. There are no other focal lesions. There is no main pancreatic ductal dilatation or peripancreatic inflammatory change. Spleen: Unremarkable. Adrenals/Urinary Tract: Adrenals are unremarkable. Multiple renal cysts are noted bilaterally, with 1 on the left demonstrating a thin internal septation. There are no solid or suspicious lesions. Calcifications in the kidneys are favored to reflect vascular calcifications as opposed to renal stones. There is bilateral perinephric stranding, nonspecific. There is no hydronephrosis or hydroureter. The bladder is distended. There is a small right posterior bladder diverticulum likely reflecting chronic outlet obstruction (2-107). Stomach/Bowel: There is a small hiatal hernia. The stomach is otherwise unremarkable, allowing for the degree of decompression. There is no evidence of bowel obstruction. There is extensive sigmoid diverticulosis without evidence of acute diverticulitis. A loop of small bowel is herniated into the right inguinal canal without evidence of obstruction or strangulation. The appendix is normal. Vascular/Lymphatic: There are extensive vascular calcifications throughout the abdomen  and pelvis likely resulting in high-grade stenoses of the celiac artery, SMA, bilateral renal arteries, and bilateral iliofemoral vessels. The main portal and splenic veins are patent. There is no abdominal or pelvic lymphadenopathy. Reproductive: Prostate is enlarged measuring up to 5.8 cm transverse. The prostate impresses upon the inferior aspect of the bladder. Other: There is no ascites or free air. Is a bowel containing right inguinal hernia, as above. Musculoskeletal: There is no acute osseous abnormality or aggressive  osseous lesion. There are multilevel degenerative changes throughout the lumbar spine. IMPRESSION: 1. Debris in the trachea and airways with associated bronchial wall thickening suspicious for aspiration. 2. Otherwise, clear lungs with no focal consolidation or suspicious nodule. The finding on the prior radiograph was likely artifactual. 3. No acute findings in the abdomen or pelvis. 4. Right inguinal hernia containing a loop of small bowel without evidence of obstruction or incarceration. 5. Enlarged prostate which impresses upon the inferior aspect of the bladder. A small right posterior bladder diverticulum likely reflects chronic outlet obstruction. 6. Diverticulosis without evidence of acute diverticulitis. 7. Subcentimeter cystic lesion in the pancreatic tail may reflect an intraductal papillary mucinous neoplasm. 8. Multilevel degenerative change of the spine with diffuse idiopathic skeletal hyperostosis. 9. Extensive vascular calcifications throughout the thoracoabdominal aorta and major branch vessels likely resulting in high-grade stenoses throughout the abdominal/pelvic vasculature. 10. Aortic valve, mitral annular, and coronary artery calcifications. Aortic Atherosclerosis (ICD10-I70.0). Electronically Signed   By: Valetta Mole M.D.   On: 07/20/2021 15:26   DG CHEST PORT 1 VIEW  Result Date: 07/25/2021 CLINICAL DATA:  Check NG placement EXAM: PORTABLE CHEST 1 VIEW COMPARISON:  Film from earlier in the same day. FINDINGS: Cardiac shadow is within normal limits. Aortic calcifications are again seen. Patchy infiltrate is noted in the left lung although improved compared with exam. Gastric catheter is noted coiled within the oropharynx. This should be withdrawn completely and readvanced. IMPRESSION: Gastric catheter coiled within the oropharynx. This should be withdrawn and readvanced. Improving aeration in the left lung. Electronically Signed   By: Inez Catalina M.D.   On: 07/25/2021 20:35   DG Chest  Port 1 View  Result Date: 07/25/2021 CLINICAL DATA:  Questionable sepsis EXAM: PORTABLE CHEST 1 VIEW COMPARISON:  Five days ago FINDINGS: New airspace disease on the left. Clear right lung. Normal heart size and mediastinal contours. Extensive artifact from EKG leads. IMPRESSION: New, extensive pneumonia on the left. Electronically Signed   By: Jorje Guild M.D.   On: 07/25/2021 05:27   DG Chest Portable 1 View  Result Date: 07/20/2021 CLINICAL DATA:  Cough, shortness of breath EXAM: PORTABLE CHEST 1 VIEW COMPARISON:  Chest radiograph 10/20/2019 FINDINGS: The cardiomediastinal silhouette is normal There is no focal consolidation or pulmonary edema. There is a 1.2 cm nodular opacity projecting over the right lower lobe not seen on the prior study. The right costophrenic angle is partially excluded, but there is no significant right pleural effusion. There is no left effusion. There is no pneumothorax. There is no acute osseous abnormality. IMPRESSION: 1. 1.2 cm nodular opacity projecting over the right lower lobe. Recommend nonemergent CT chest for further evaluation. 2. No radiographic evidence of acute cardiopulmonary process. Electronically Signed   By: Valetta Mole M.D.   On: 07/20/2021 10:50   DG Chest Port 1V same Day  Result Date: 07/25/2021 CLINICAL DATA:  Enteric catheter repositioning, emesis EXAM: PORTABLE CHEST 1 VIEW COMPARISON:  07/25/2021 FINDINGS: Frontal view of the chest demonstrates 3 position of the  enteric catheter, tip and side port projecting over the distal thoracic esophagus. Recommend advancing at least 6 cm to ensure side port placement within the gastric lumen. The cardiac silhouette is stable. Diffuse interstitial prominence, with stable left basilar airspace disease. No effusion or pneumothorax. No acute bony abnormality. IMPRESSION: 1. Enteric catheter tip and side port projecting over the distal thoracic esophagus. Recommend advancing 6 cm. 2. Stable left basilar airspace  disease. Electronically Signed   By: Randa Ngo M.D.   On: 07/25/2021 20:51   ECHOCARDIOGRAM COMPLETE  Result Date: 07/25/2021    ECHOCARDIOGRAM REPORT   Patient Name:   LEVAR FAYSON Date of Exam: 07/25/2021 Medical Rec #:  [id number]      Height:       70.0 in Accession #:    [id number]     Weight:       160.3 lb Date of Birth:  11-17-1932      BSA:          1.900 m Patient Age:    79 years       BP:           135/76 mmHg Patient Gender: M              HR:           101 bpm. Exam Location:  [facility] Procedure: 2D Echo, Cardiac Doppler and Color Doppler Indications:    I48.92* Unspecified atrial flutter  History:        Patient has prior history of Echocardiogram examinations, most                 recent 03/08/2017. Abnormal ECG, COPD, Arrythmias:Atrial Flutter,                 Signs/Symptoms:Dyspnea and Shortness of Breath; Risk                 Factors:Hypertension, Dyslipidemia and Current Smoker.  Sonographer:    Roseanna Rainbow RDCS Referring Phys: [phone] Kemba Hoppes  Sonographer Comments: Technically difficult study due to poor echo windows. Image acquisition challenging due to respiratory motion. IMPRESSIONS  1. Left ventricular ejection fraction, by estimation, is 60 to 65%. The left ventricle has normal function. The left ventricle has no regional wall motion abnormalities. Left ventricular diastolic parameters are consistent with Grade I diastolic dysfunction (impaired relaxation).  2. Right ventricular systolic function is normal. The right ventricular size is normal.  3. Interatrial septal lipoma.  4. The mitral valve is grossly normal. No evidence of mitral valve regurgitation.  5. Focal calcification. The aortic valve was not well visualized. Aortic valve regurgitation is not visualized.  6. The inferior vena cava is dilated in size with >50% respiratory variability, suggesting right atrial pressure of 8 mmHg. FINDINGS  Left Ventricle: Left ventricular ejection fraction, by estimation, is 60 to 65%. The  left ventricle has normal function. The left ventricle has no regional wall motion abnormalities. The left ventricular internal cavity size was normal in size. There is  no left ventricular hypertrophy. Left ventricular diastolic parameters are consistent with Grade I diastolic dysfunction (impaired relaxation). Right Ventricle: The right ventricular size is normal. No increase in right ventricular wall thickness. Right ventricular systolic function is normal. Left Atrium: Left atrial size was normal in size. Right Atrium: Right atrial size was normal in size. Pericardium: There is no evidence of pericardial effusion. Mitral Valve: The mitral valve is grossly normal. There is mild calcification of the mitral valve leaflet(s).  No evidence of mitral valve regurgitation. Tricuspid Valve: The tricuspid valve is not well visualized. Tricuspid valve regurgitation is not demonstrated. Aortic Valve: Focal calcification. The aortic valve was not well visualized. Aortic valve regurgitation is not visualized. Pulmonic Valve: The pulmonic valve was not well visualized. Pulmonic valve regurgitation is not visualized. Aorta: The aortic root and ascending aorta are structurally normal, with no evidence of dilitation. Venous: The inferior vena cava is dilated in size with greater than 50% respiratory variability, suggesting right atrial pressure of 8 mmHg. IAS/Shunts: No atrial level shunt detected by color flow Doppler.  LEFT VENTRICLE PLAX 2D LVIDd:         4.10 cm     Diastology LVIDs:         2.90 cm     LV e' medial:    5.22 cm/s LV PW:         1.00 cm     LV E/e' medial:  16.9 LV IVS:        1.10 cm     LV e' lateral:   6.09 cm/s LVOT diam:     2.30 cm     LV E/e' lateral: 14.5 LV SV:         95 LV SV Index:   50 LVOT Area:     4.15 cm  LV Volumes (MOD) LV vol d, MOD A2C: 59.4 ml LV vol d, MOD A4C: 87.2 ml LV vol s, MOD A2C: 23.1 ml LV vol s, MOD A4C: 30.3 ml LV SV MOD A2C:     36.3 ml LV SV MOD A4C:     87.2 ml LV SV MOD  BP:      45.8 ml RIGHT VENTRICLE             IVC RV S prime:     16.60 cm/s  IVC diam: 2.40 cm TAPSE (M-mode): 2.7 cm LEFT ATRIUM           Index        RIGHT ATRIUM           Index LA diam:      3.50 cm 1.84 cm/m   RA Area:     14.30 cm LA Vol (A2C): 26.6 ml 14.00 ml/m  RA Volume:   33.30 ml  17.53 ml/m LA Vol (A4C): 36.6 ml 19.26 ml/m  AORTIC VALVE LVOT Vmax:   114.00 cm/s LVOT Vmean:  72.900 cm/s LVOT VTI:    0.227 m  AORTA Ao Root diam: 3.60 cm Ao Asc diam:  3.50 cm MITRAL VALVE MV Area (PHT): 3.82 cm     SHUNTS MV Decel Time: 198 msec     Systemic VTI:  0.23 m MV E velocity: 88.10 cm/s   Systemic Diam: 2.30 cm MV A velocity: 129.00 cm/s MV E/A ratio:  0.68 Mary Scientist, physiological signed by Phineas Inches Signature Date/Time: 07/25/2021/4:49:27 PM    Final    US Abdomen Limited RUQ (LIVER/GB)  Result Date: 07/25/2021 CLINICAL DATA:  Abdominal pain EXAM: ULTRASOUND ABDOMEN LIMITED RIGHT UPPER QUADRANT COMPARISON:  12/20/2016 FINDINGS: Gallbladder: Gallbladder stones are seen. There is no abnormal wall thickening. Technologist did not observe any tenderness over the gallbladder. There is no fluid around the gallbladder. Common bile duct: Diameter: 2.4 mm Liver: There is increased echogenicity. No focal abnormality is seen. Portal vein is patent on color Doppler imaging with normal direction of blood flow towards the liver. Other: None. IMPRESSION: Gallbladder stones. There are no signs of acute cholecystitis. Fatty  liver. Electronically Signed   By: Elmer Picker M.D.   On: 07/25/2021 10:22    Microbiology: Recent Results (from the past 240 hour(s))  Resp Panel by RT-PCR (Flu A&B, Covid) Nasopharyngeal Swab     Status: None   Collection Time: 07/20/21 11:10 AM   Specimen: Nasopharyngeal Swab; Nasopharyngeal(NP) swabs in vial transport medium  Result Value Ref Range Status   SARS Coronavirus 2 by RT PCR NEGATIVE NEGATIVE Final    Comment: (NOTE) SARS-CoV-2 target nucleic acids are NOT  DETECTED.  The SARS-CoV-2 RNA is generally detectable in upper respiratory specimens during the acute phase of infection. The lowest concentration of SARS-CoV-2 viral copies this assay can detect is 138 copies/mL. A negative result does not preclude SARS-Cov-2 infection and should not be used as the sole basis for treatment or other patient management decisions. A negative result may occur with  improper specimen collection/handling, submission of specimen other than nasopharyngeal swab, presence of viral mutation(s) within the areas targeted by this assay, and inadequate number of viral copies(<138 copies/mL). A negative result must be combined with clinical observations, patient history, and epidemiological information. The expected result is Negative.  Fact Sheet for Patients:  EntrepreneurPulse.com.au  Fact Sheet for Healthcare Providers:  IncredibleEmployment.be  This test is no t yet approved or cleared by the [place] FDA and  has been authorized for detection and/or diagnosis of SARS-CoV-2 by FDA under an Emergency Use Authorization (EUA). This EUA will remain  in effect (meaning this test can be used) for the duration of the COVID-19 declaration under Section 564(b)(1) of the Act, 21 U.S.C.section 360bbb-3(b)(1), unless the authorization is terminated  or revoked sooner.       Influenza A by PCR NEGATIVE NEGATIVE Final   Influenza B by PCR NEGATIVE NEGATIVE Final    Comment: (NOTE) The Xpert Xpress SARS-CoV-2/FLU/RSV plus assay is intended as an aid in the diagnosis of influenza from Nasopharyngeal swab specimens and should not be used as a sole basis for treatment. Nasal washings and aspirates are unacceptable for Xpert Xpress SARS-CoV-2/FLU/RSV testing.  Fact Sheet for Patients: EntrepreneurPulse.com.au  Fact Sheet for Healthcare Providers: IncredibleEmployment.be  This test is not yet  approved or cleared by the [place] FDA and has been authorized for detection and/or diagnosis of SARS-CoV-2 by FDA under an Emergency Use Authorization (EUA). This EUA will remain in effect (meaning this test can be used) for the duration of the COVID-19 declaration under Section 564(b)(1) of the Act, 21 U.S.C. section 360bbb-3(b)(1), unless the authorization is terminated or revoked.  Performed at [facility], [address], [place], [place] [address]   Gastrointestinal Panel by PCR , Stool     Status: Abnormal   Collection Time: 07/21/21  8:00 AM   Specimen: Stool  Result Value Ref Range Status   Campylobacter species NOT DETECTED NOT DETECTED Final   Plesimonas shigelloides NOT DETECTED NOT DETECTED Final   Salmonella species NOT DETECTED NOT DETECTED Final   Yersinia enterocolitica NOT DETECTED NOT DETECTED Final   Vibrio species NOT DETECTED NOT DETECTED Final   Vibrio cholerae NOT DETECTED NOT DETECTED Final   Enteroaggregative E coli (EAEC) NOT DETECTED NOT DETECTED Final   Enteropathogenic E coli (EPEC) NOT DETECTED NOT DETECTED Final   Enterotoxigenic E coli (ETEC) NOT DETECTED NOT DETECTED Final   Shiga like toxin producing E coli (STEC) NOT DETECTED NOT DETECTED Final   Shigella/Enteroinvasive E coli (EIEC) NOT DETECTED NOT DETECTED Final   Cryptosporidium NOT DETECTED NOT DETECTED  Final   Cyclospora cayetanensis NOT DETECTED NOT DETECTED Final   Entamoeba histolytica NOT DETECTED NOT DETECTED Final   Giardia lamblia NOT DETECTED NOT DETECTED Final   Adenovirus F40/41 NOT DETECTED NOT DETECTED Final   Astrovirus NOT DETECTED NOT DETECTED Final   Norovirus GI/GII DETECTED (A) NOT DETECTED Final    Comment: RESULT CALLED TO, READ BACK BY AND VERIFIED WITHMarline Backbone RN [phone] 07/22/21 HNM    Rotavirus A NOT DETECTED NOT DETECTED Final   Sapovirus (I, II, IV, and V) NOT DETECTED NOT DETECTED Final    Comment: Performed at [facility], [address]  [place], [place] [address]  C Difficile Quick Screen w PCR reflex     Status: None   Collection Time: 07/21/21  9:44 AM   Specimen: STOOL  Result Value Ref Range Status   C Diff antigen NEGATIVE NEGATIVE Final   C Diff toxin NEGATIVE NEGATIVE Final   C Diff interpretation No C. difficile detected.  Final    Comment: Performed at [facility], [address], [place], [place] [address]  Resp Panel by RT-PCR (Flu A&B, Covid) Nasopharyngeal Swab     Status: None   Collection Time: 07/25/21  5:05 AM   Specimen: Nasopharyngeal Swab; Nasopharyngeal(NP) swabs in vial transport medium  Result Value Ref Range Status   SARS Coronavirus 2 by RT PCR NEGATIVE NEGATIVE Final    Comment: (NOTE) SARS-CoV-2 target nucleic acids are NOT DETECTED.  The SARS-CoV-2 RNA is generally detectable in upper respiratory specimens during the acute phase of infection. The lowest concentration of SARS-CoV-2 viral copies this assay can detect is 138 copies/mL. A negative result does not preclude SARS-Cov-2 infection and should not be used as the sole basis for treatment or other patient management decisions. A negative result may occur with  improper specimen collection/handling, submission of specimen other than nasopharyngeal swab, presence of viral mutation(s) within the areas targeted by this assay, and inadequate number of viral copies(<138 copies/mL). A negative result must be combined with clinical observations, patient history, and epidemiological information. The expected result is Negative.  Fact Sheet for Patients:  EntrepreneurPulse.com.au  Fact Sheet for Healthcare Providers:  IncredibleEmployment.be  This test is no t yet approved or cleared by the [place] FDA and  has been authorized for detection and/or diagnosis of SARS-CoV-2 by FDA under an Emergency Use Authorization (EUA). This EUA will remain  in effect (meaning this test can be used) for the duration of  the COVID-19 declaration under Section 564(b)(1) of the Act, 21 U.S.C.section 360bbb-3(b)(1), unless the authorization is terminated  or revoked sooner.       Influenza A by PCR NEGATIVE NEGATIVE Final   Influenza B by PCR NEGATIVE NEGATIVE Final    Comment: (NOTE) The Xpert Xpress SARS-CoV-2/FLU/RSV plus assay is intended as an aid in the diagnosis of influenza from Nasopharyngeal swab specimens and should not be used as a sole basis for treatment. Nasal washings and aspirates are unacceptable for Xpert Xpress SARS-CoV-2/FLU/RSV testing.  Fact Sheet for Patients: EntrepreneurPulse.com.au  Fact Sheet for Healthcare Providers: IncredibleEmployment.be  This test is not yet approved or cleared by the [place] FDA and has been authorized for detection and/or diagnosis of SARS-CoV-2 by FDA under an Emergency Use Authorization (EUA). This EUA will remain in effect (meaning this test can be used) for the duration of the COVID-19 declaration under Section 564(b)(1) of the Act, 21 U.S.C. section 360bbb-3(b)(1), unless the authorization is terminated or revoked.  Performed  at [facility], [address], [place], [place] [address]   Blood Culture (routine x 2)     Status: None (Preliminary result)   Collection Time: 07/25/21  5:05 AM   Specimen: Left Antecubital; Blood  Result Value Ref Range Status   Specimen Description LEFT ANTECUBITAL  Final   Special Requests   Final    BOTTLES DRAWN AEROBIC AND ANAEROBIC Blood Culture results may not be optimal due to an excessive volume of blood received in culture bottles   Culture   Final    NO GROWTH 2 DAYS Performed at [facility], [address], [place], [place] [address]    Report Status PENDING  Incomplete  Blood Culture (routine x 2)     Status: None (Preliminary result)   Collection Time: 07/25/21  5:05 AM   Specimen: Right Antecubital; Blood  Result Value Ref Range Status   Specimen  Description RIGHT ANTECUBITAL  Final   Special Requests   Final    BOTTLES DRAWN AEROBIC AND ANAEROBIC Blood Culture adequate volume   Culture   Final    NO GROWTH 2 DAYS Performed at [facility], [address] [place], [place] [address]    Report Status PENDING  Incomplete  MRSA Next Gen by PCR, Nasal     Status: None   Collection Time: 07/25/21  8:54 AM   Specimen: Nasal Mucosa; Nasal Swab  Result Value Ref Range Status   MRSA by PCR Next Gen NOT DETECTED NOT DETECTED Final    Comment: (NOTE) The GeneXpert MRSA Assay (FDA approved for NASAL specimens only), is one component of a comprehensive MRSA colonization surveillance program. It is not intended to diagnose MRSA infection nor to guide or monitor treatment for MRSA infections. Test performance is not FDA approved in patients less than 64 years old. Performed at [facility], [address] [place], [place] [address]      Labs: Basic Metabolic Panel: Recent Labs  Lab 07/22/21 0839 07/25/21 0505 07/26/21 0510 07/26/21 1936 07/27/21 0423  NA 132* 137 140 142 139  K 3.2* 3.5 3.8 3.3* 3.8  CL 103 105 112* 109 108  CO2 22 17* 19* 23 21*  GLUCOSE 106* 196* 105* 117* 125*  BUN _0 31* 39*  CREATININE 0.62 1.22 0.79 0.98 1.06  CALCIUM 8.7* 10.1 9.4 8.9 8.5*  MG  --   --   --  1.9  --    Liver Function Tests: Recent Labs  Lab 07/25/21 0505 07/26/21 0510 07/27/21 0423  AST 60* 2,195* 1,011*  ALT 52* 2,041* 1,967*  ALKPHOS 93 130* 185*  BILITOT 0.8 1.2 1.5*  PROT 7.0 5.5* 5.6*  ALBUMIN 3.1* 2.5* 2.5*   No results for input(s): LIPASE, AMYLASE in the last 168 hours. No results for input(s): AMMONIA in the last 168 hours. CBC: Recent Labs  Lab 07/21/21 0436 07/22/21 0839 07/25/21 0505 07/26/21 0510 07/27/21 0423  WBC 18.0* 14.6* 34.5* 74.5*   75.2* 70.2*  NEUTROABS  --   --  32.1* 69.7* 64.7*  HGB 11.5* 12.1* 15.4 12.3*   12.2* 13.0  HCT 34.4* 35.5* 46.0 37.3*   36.6* 38.2*  MCV 94.8 94.2 98.5 98.2    96.8 98.7  PLT 296 292 422* 350   349 384   Cardiac Enzymes: No results for input(s): CKTOTAL, CKMB, CKMBINDEX, TROPONINI in the last 168 hours. BNP: Invalid input(s): POCBNP CBG: Recent Labs  Lab 07/20/21 2132 07/21/21 0449  GLUCAP 115* 116*  Time coordinating discharge:  36 minutes  Signed:  Orson Eva, DO [facility] Pager: [phone] 07/27/2021, 12:01 PM

## 2021-07-27 NOTE — Progress Notes (Signed)
Palliative: Peter [name] and his family are requesting comfort and dignity at end-of-life, hospice care at his home with [facility].  Conference with transition care team related to patient condition and needs.  Plan: Home with hospice care with [facility].  No charge Quinn Axe, NP Palliative medicine team Team phone [phone] Greater than 50% of this time was spent counseling and coordinating care related to the above assessment and plan.

## 2021-07-27 NOTE — TOC Progression Note (Signed)
Transition of Care [facility]) - Progression Note    Patient Details  Name: Peter [name] MRN: [id number] Date of Birth: 1932-08-08  [facility] CM/SW Contact  Salome Arnt, [place] Phone Number: 07/27/2021, 11:47 AM  Clinical Narrative:   LCSW received call from hospice. Pt's DME has been ordered. Hospice will call when it is delivered so EMS can be set up. LCSW notified hospice that there are no convalescent trucks between 3:00-8:00 this evening. Per hospice, they can admit anytime pt arrives at home.          Expected Discharge Plan and Services                                                 Social Determinants of Health (SDOH) Interventions    Readmission Risk Interventions No flowsheet data found.

## 2021-07-30 LAB — CULTURE, BLOOD (ROUTINE X 2)
Culture: NO GROWTH
Culture: NO GROWTH
Special Requests: ADEQUATE

## 2021-08-11 DEATH — deceased
